# Patient Record
Sex: Male | Born: 1955 | Race: White | Hispanic: No | Marital: Married | Smoking: Never smoker
Health system: Southern US, Community
[De-identification: ages and names within clinical notes are randomized; demographics above are authoritative.]

## PROBLEM LIST (undated history)

## (undated) DIAGNOSIS — N433 Hydrocele, unspecified: Secondary | ICD-10-CM

## (undated) DIAGNOSIS — R7303 Prediabetes: Secondary | ICD-10-CM

## (undated) DIAGNOSIS — R7881 Bacteremia: Secondary | ICD-10-CM

## (undated) DIAGNOSIS — I10 Essential (primary) hypertension: Secondary | ICD-10-CM

## (undated) DIAGNOSIS — K219 Gastro-esophageal reflux disease without esophagitis: Secondary | ICD-10-CM

## (undated) DIAGNOSIS — R9341 Abnormal radiologic findings on diagnostic imaging of renal pelvis, ureter, or bladder: Secondary | ICD-10-CM

## (undated) DIAGNOSIS — R9431 Abnormal electrocardiogram [ECG] [EKG]: Secondary | ICD-10-CM

## (undated) DIAGNOSIS — E119 Type 2 diabetes mellitus without complications: Secondary | ICD-10-CM

## (undated) DIAGNOSIS — S43004A Unspecified dislocation of right shoulder joint, initial encounter: Secondary | ICD-10-CM

## (undated) HISTORY — DX: Abnormal electrocardiogram (ECG) (EKG): R94.31

## (undated) HISTORY — DX: Essential (primary) hypertension: I10

## (undated) HISTORY — DX: Prediabetes: R73.03

## (undated) HISTORY — PX: OTHER SURGICAL HISTORY: SHX169

## (undated) HISTORY — DX: Gastro-esophageal reflux disease without esophagitis: K21.9

---

## 2010-10-04 ENCOUNTER — Encounter (HOSPITAL_COMMUNITY)
Admission: RE | Admit: 2010-10-04 | Discharge: 2010-10-23 | Payer: Self-pay | Source: Home / Self Care | Attending: Endocrinology | Admitting: Endocrinology

## 2010-10-16 ENCOUNTER — Ambulatory Visit (HOSPITAL_COMMUNITY)
Admission: RE | Admit: 2010-10-16 | Discharge: 2010-10-16 | Payer: Self-pay | Source: Home / Self Care | Attending: Endocrinology | Admitting: Endocrinology

## 2010-10-25 ENCOUNTER — Other Ambulatory Visit: Payer: Self-pay | Admitting: Endocrinology

## 2010-10-25 DIAGNOSIS — E041 Nontoxic single thyroid nodule: Secondary | ICD-10-CM

## 2010-12-04 ENCOUNTER — Other Ambulatory Visit (HOSPITAL_COMMUNITY)
Admission: RE | Admit: 2010-12-04 | Discharge: 2010-12-04 | Disposition: A | Discharge status: Home / Self Care | Payer: Self-pay | Source: Ambulatory Visit | Attending: Interventional Radiology | Admitting: Interventional Radiology

## 2010-12-04 ENCOUNTER — Ambulatory Visit
Admission: RE | Admit: 2010-12-04 | Discharge: 2010-12-04 | Disposition: A | Discharge status: Home / Self Care | Payer: Self-pay | Source: Ambulatory Visit | Attending: Endocrinology | Admitting: Endocrinology

## 2010-12-04 ENCOUNTER — Other Ambulatory Visit: Payer: Self-pay | Admitting: Interventional Radiology

## 2010-12-04 DIAGNOSIS — E041 Nontoxic single thyroid nodule: Secondary | ICD-10-CM

## 2010-12-04 DIAGNOSIS — E049 Nontoxic goiter, unspecified: Secondary | ICD-10-CM | POA: Insufficient documentation

## 2010-12-05 ENCOUNTER — Other Ambulatory Visit: Payer: Self-pay

## 2012-09-01 ENCOUNTER — Ambulatory Visit: Payer: Self-pay | Admitting: Cardiovascular Disease

## 2012-09-29 ENCOUNTER — Ambulatory Visit: Payer: Self-pay | Admitting: Cardiovascular Disease

## 2012-10-27 ENCOUNTER — Ambulatory Visit: Payer: Self-pay | Admitting: Cardiovascular Disease

## 2012-10-28 ENCOUNTER — Encounter: Payer: Self-pay | Admitting: Cardiovascular Disease

## 2012-10-28 ENCOUNTER — Ambulatory Visit (INDEPENDENT_AMBULATORY_CARE_PROVIDER_SITE_OTHER): Payer: BC Managed Care – PPO | Admitting: Cardiovascular Disease

## 2012-10-28 ENCOUNTER — Encounter: Payer: Self-pay | Admitting: Cardiology

## 2012-10-28 VITALS — BP 163/93 | HR 68 | Ht 66.0 in | Wt 387.0 lb

## 2012-10-28 DIAGNOSIS — I10 Essential (primary) hypertension: Secondary | ICD-10-CM

## 2012-10-28 DIAGNOSIS — I455 Other specified heart block: Secondary | ICD-10-CM

## 2012-10-28 DIAGNOSIS — I454 Nonspecific intraventricular block: Secondary | ICD-10-CM | POA: Insufficient documentation

## 2012-10-28 DIAGNOSIS — I440 Atrioventricular block, first degree: Secondary | ICD-10-CM | POA: Insufficient documentation

## 2012-10-28 DIAGNOSIS — R9431 Abnormal electrocardiogram [ECG] [EKG]: Secondary | ICD-10-CM

## 2012-10-28 NOTE — Progress Notes (Signed)
   History of Present Illness: 57 yo white male with history of HTN, obesity, GERD here today as a new patient for evaluation of an abnormal EKG. He tells me that he has been doing well. He is a full time farmer and logger. He denies any chest pain, SOB, palpitations, near syncope or syncope. 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 No prior cardiac disease. No family history of CAD. He has never smoked   Primary Care Physician: Rudi Heap  Past Medical History  Diagnosis Date  . Hypertension   . Obesity   . Abnormal EKG   . GERD (gastroesophageal reflux disease)   . Borderline diabetes     Past Surgical History  Procedure Date  . None     Current Outpatient Prescriptions  Medication Sig Dispense Refill  . amLODipine (NORVASC) 10 MG tablet Take 10 mg by mouth daily.      . furosemide (LASIX) 20 MG tablet Take 20 mg by mouth every morning.      Marland Kitchen ibuprofen (ADVIL) 200 MG tablet Take 600 mg by mouth daily as needed.      Marland Kitchen lisinopril (PRINIVIL,ZESTRIL) 40 MG tablet Take 40 mg by mouth daily.      Marland Kitchen omeprazole (PRILOSEC) 20 MG capsule Take 20 mg by mouth daily.        Allergies  Allergen Reactions  . Diovan Hct (Valsartan-Hydrochlorothiazide)     History   Social History  . Marital Status: Married    Spouse Name: N/A    Number of Children: 2  . Years of Education: N/A   Occupational History  . Logger/Farmer    Social History Main Topics  . Smoking status: Never Smoker   . Smokeless tobacco: Not on file  . Alcohol Use: 2.0 oz/week    4 drink(s) per week  . Drug Use: No  . Sexually Active: Not on file   Other Topics Concern  . Not on file   Social History Narrative  . No narrative on file    Family History  Problem Relation Age of Onset  . Cancer Father     prostate cancer  . CAD Neg Hx     Review of Systems:  As stated in the HPI and otherwise negative.   BP 163/93  Pulse 68  Ht 5\' 6"  (1.676 m)  Wt 387 lb (175.542 kg)  BMI 62.46 kg/m2  Physical  Examination: General: Well developed, well nourished, NAD HEENT: OP clear, mucus membranes moist SKIN: warm, dry. No rashes. Neuro: No focal deficits Musculoskeletal: Muscle strength 5/5 all ext Psychiatric: Mood and affect normal Neck: No JVD, no carotid bruits, no thyromegaly, no lymphadenopathy. Lungs:Clear bilaterally, no wheezes, rhonci, crackles Cardiovascular: Regular rate and rhythm. No murmurs, gallops or rubs. Abdomen:Soft. Bowel sounds present. Non-tender.  Extremities: No lower extremity edema. Pulses are 2 + in the bilateral DP/PT.  EKG: Sinus rhythm, 1st degree AV block. IVCD.   Assessment and Plan:   1. Abnormal EKG: He has first degree AV block on EKG with IVCD, non-specific. He denies SOB, palpitations, dizziness, syncope. Will arrange echo to exclude structural heart disease and assess LVEF.   2. HTN: Continue current meds. Management per primary care.

## 2012-10-28 NOTE — Patient Instructions (Addendum)
Your physician recommends that you schedule a follow-up appointment in:   6 weeks.   Your physician has requested that you have an echocardiogram. Echocardiography is a painless test that uses sound waves to create images of your heart. It provides your doctor with information about the size and shape of your heart and how well your heart's chambers and valves are working. This procedure takes approximately one hour. There are no restrictions for this procedure.    

## 2012-11-03 ENCOUNTER — Ambulatory Visit (HOSPITAL_COMMUNITY): Payer: BC Managed Care – PPO | Attending: Cardiology | Admitting: Radiology

## 2012-11-03 DIAGNOSIS — R9431 Abnormal electrocardiogram [ECG] [EKG]: Secondary | ICD-10-CM

## 2012-11-03 DIAGNOSIS — I440 Atrioventricular block, first degree: Secondary | ICD-10-CM | POA: Insufficient documentation

## 2012-11-03 DIAGNOSIS — R7309 Other abnormal glucose: Secondary | ICD-10-CM | POA: Insufficient documentation

## 2012-11-03 DIAGNOSIS — I10 Essential (primary) hypertension: Secondary | ICD-10-CM

## 2012-11-03 DIAGNOSIS — F17200 Nicotine dependence, unspecified, uncomplicated: Secondary | ICD-10-CM | POA: Insufficient documentation

## 2012-11-03 MED ORDER — PERFLUTREN PROTEIN A MICROSPH IV SUSP
3.0000 mL | Freq: Once | INTRAVENOUS | Status: AC
  Administered 2012-11-03: 3 mL via INTRAVENOUS

## 2012-11-03 NOTE — Progress Notes (Signed)
Echocardiogram performed with [employer].  

## 2012-11-11 ENCOUNTER — Telehealth: Payer: Self-pay | Admitting: Cardiovascular Disease

## 2012-11-11 NOTE — Telephone Encounter (Signed)
New problem:  Test results.  

## 2012-11-11 NOTE — Telephone Encounter (Signed)
Spoke with pt's wife and reviewed echo results with her.  

## 2012-12-14 ENCOUNTER — Ambulatory Visit: Payer: BC Managed Care – PPO | Admitting: Cardiovascular Disease

## 2013-01-21 ENCOUNTER — Encounter: Payer: Self-pay | Admitting: Cardiovascular Disease

## 2013-01-21 ENCOUNTER — Ambulatory Visit (INDEPENDENT_AMBULATORY_CARE_PROVIDER_SITE_OTHER): Payer: BC Managed Care – PPO | Admitting: Cardiovascular Disease

## 2013-01-21 VITALS — BP 152/94 | HR 67 | Ht 67.0 in | Wt 386.6 lb

## 2013-01-21 DIAGNOSIS — I10 Essential (primary) hypertension: Secondary | ICD-10-CM

## 2013-01-21 DIAGNOSIS — I517 Cardiomegaly: Secondary | ICD-10-CM

## 2013-01-21 DIAGNOSIS — I119 Hypertensive heart disease without heart failure: Secondary | ICD-10-CM

## 2013-01-21 MED ORDER — TRIAMTERENE 50 MG PO CAPS: 50.0000 mg | ORAL_CAPSULE | Freq: Every day | ORAL | Status: DC

## 2013-01-21 NOTE — Progress Notes (Signed)
History of Present Illness: 57 yo white male with history of HTN, obesity, GERD here today for cardiac follow up. I saw him as a new patient 10/28/12 for evaluation of an abnormal EKG. He tells me that he has been doing well. He is a full time farmer and logger. He denies any chest pain, SOB, palpitations, near syncope or syncope. 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 No prior cardiac disease. No family history of CAD. He has never smoked   Primary Care Physician: Rudi Heap [name])  Past Medical History  Diagnosis Date  . Hypertension   . Obesity   . Abnormal EKG   . GERD (gastroesophageal reflux disease)   . Borderline diabetes     Past Surgical History  Procedure Laterality Date  . None      Current Outpatient Prescriptions  Medication Sig Dispense Refill  . amLODipine (NORVASC) 10 MG tablet Take 10 mg by mouth daily.      . furosemide (LASIX) 20 MG tablet Take 20 mg by mouth every morning.      Marland Kitchen ibuprofen (ADVIL) 200 MG tablet Take 600 mg by mouth daily as needed.      Marland Kitchen lisinopril (PRINIVIL,ZESTRIL) 40 MG tablet Take 40 mg by mouth daily.      Marland Kitchen omeprazole (PRILOSEC) 20 MG capsule Take 20 mg by mouth daily.       No current facility-administered medications for this visit.    Allergies  Allergen Reactions  . Diovan Hct (Valsartan-Hydrochlorothiazide)     History   Social History  . Marital Status: Married    Spouse Name: N/A    Number of Children: 2  . Years of Education: N/A   Occupational History  . Logger/Farmer    Social History Main Topics  . Smoking status: Never Smoker   . Smokeless tobacco: Not on file  . Alcohol Use: 2.0 oz/week    4 drink(s) per week  . Drug Use: No  . Sexually Active: Not on file   Other Topics Concern  . Not on file   Social History Narrative  . No narrative on file    Family History  Problem Relation Age of Onset  . Cancer Father     prostate cancer  . CAD Neg Hx     Review of Systems:  As stated in  the HPI and otherwise negative.   BP 152/94  Pulse 67  Ht 5\' 7"  (1.702 m)  Wt 386 lb 9.6 oz (175.361 kg)  BMI 60.54 kg/m2  SpO2 97%  Physical Examination: General: Well developed, well nourished, NAD HEENT: OP clear, mucus membranes moist SKIN: warm, dry. No rashes. Neuro: No focal deficits Musculoskeletal: Muscle strength 5/5 all ext Psychiatric: Mood and affect normal Neck: No JVD, no carotid bruits, no thyromegaly, no lymphadenopathy. Lungs:Clear bilaterally, no wheezes, rhonci, crackles Cardiovascular: Regular rate and rhythm. No murmurs, gallops or rubs. Abdomen:Soft. Bowel sounds present. Non-tender.  Extremities: No lower extremity edema. Pulses are 2 + in the bilateral DP/PT.  Echo 11/03/12: Left ventricle: The cavity size was normal. Wall thickness was increased in a pattern of moderate LVH. Systolic function was normal. The estimated ejection fraction was in the range of 55% to 60%. Wall motion was normal; there were no regional wall motion abnormalities. Left ventricular diastolic function parameters were normal. - Right ventricle: The cavity size was mildly dilated. - Right atrium: The atrium was mildly dilated.  Assessment and Plan:   1. Abnormal EKG: He has  first degree AV block on EKG with IVCD, non-specific. He denies SOB, palpitations, dizziness, syncope. Echo with LVH.   2. Hypertensive heart disease: BP elevated. Will continue NOrvasc, Ace-inh and add Triamterene 50 mg po Qdaily. Further management of BP in primary care. He will buy a BP cuff and follow at home.   3. Obesity: We have spoken about weight loss. He would like to consider lap band procedure. Will call back in several weeks if he wants to go forward and we will refer to [facility].

## 2013-01-21 NOTE — Patient Instructions (Addendum)
Your physician wants you to follow-up in:  12 months. You will receive a reminder letter in the mail two months in advance. If you don't receive a letter, please call our office to schedule the follow-up appointment.  Your physician has recommended you make the following change in your medication:  Start triamterene 50 mg by mouth daily

## 2013-01-26 ENCOUNTER — Telehealth: Payer: Self-pay | Admitting: Cardiovascular Disease

## 2013-01-26 DIAGNOSIS — I119 Hypertensive heart disease without heart failure: Secondary | ICD-10-CM

## 2013-01-26 MED ORDER — METOPROLOL TARTRATE 25 MG PO TABS: 25.0000 mg | ORAL_TABLET | Freq: Two times a day (BID) | ORAL | Status: DC

## 2013-01-26 NOTE — Telephone Encounter (Signed)
Cancel order for triamterene? Can we start Lopressor 25 mg po BID? Thanks, chris

## 2013-01-26 NOTE — Telephone Encounter (Signed)
I was unable to reach pt to give him this information. Left message for him to call back. I spoke with pharmacist at [facility] and gave her instructions to cancel prescription for triamterene (pt did not pick this up due to cost) and start Lopressor 25 mg by mouth twice daily. Pharmacist will let pt know lopressor is in place of triamterene if we do not speak with him prior to pharmacy filling.

## 2013-01-26 NOTE — Telephone Encounter (Signed)
Spoke with pharmacist at [facility]. Triamterene is not available as generic. Pt does not have prescription coverage and is unable to afford. Will send to Dr. Clifton James for possible alternative.

## 2013-01-26 NOTE — Telephone Encounter (Signed)
New problem    Patient was seen in the office on  5/1    The cost for dyrenium 50 mg . $ 112.00 need an alternative.

## 2013-01-29 ENCOUNTER — Telehealth: Payer: Self-pay | Admitting: *Deleted

## 2013-01-29 NOTE — Telephone Encounter (Signed)
I have heard that Ernest Martinez is a good guy. [facility]. cdm

## 2013-01-29 NOTE — Telephone Encounter (Signed)
Left message to call back  

## 2013-01-29 NOTE — Telephone Encounter (Signed)
Spoke with pt's wife who reports pt is interested in having lap band surgery. She would like to know which surgeon Dr. Clifton James would recommend?

## 2013-01-29 NOTE — Telephone Encounter (Signed)
I have been unable to reach pt to make sure he was aware of change. Have left messages to call back.  I spoke with pharmacist at [facility] and they have spoken with pt's wife and she is aware of change

## 2013-01-29 NOTE — Telephone Encounter (Signed)
Spoke with wife and she is aware of change. Pt is taking metoprolol as ordered.

## 2013-02-03 NOTE — Telephone Encounter (Signed)
Left message to call back  

## 2013-02-23 NOTE — Telephone Encounter (Signed)
Pt's wife notified.

## 2013-03-28 ENCOUNTER — Inpatient Hospital Stay (HOSPITAL_COMMUNITY)
Admission: AD | Admit: 2013-03-28 | Discharge: 2013-04-01 | DRG: 416 | Disposition: A | Discharge status: Home / Self Care | Payer: BC Managed Care – PPO | Source: Other Acute Inpatient Hospital | Attending: Cardiology | Admitting: Cardiology

## 2013-03-28 DIAGNOSIS — I440 Atrioventricular block, first degree: Secondary | ICD-10-CM | POA: Diagnosis present

## 2013-03-28 DIAGNOSIS — R739 Hyperglycemia, unspecified: Secondary | ICD-10-CM | POA: Diagnosis present

## 2013-03-28 DIAGNOSIS — N12 Tubulo-interstitial nephritis, not specified as acute or chronic: Secondary | ICD-10-CM | POA: Diagnosis present

## 2013-03-28 DIAGNOSIS — Z6841 Body Mass Index (BMI) 40.0 and over, adult: Secondary | ICD-10-CM

## 2013-03-28 DIAGNOSIS — R079 Chest pain, unspecified: Secondary | ICD-10-CM | POA: Diagnosis present

## 2013-03-28 DIAGNOSIS — I10 Essential (primary) hypertension: Secondary | ICD-10-CM | POA: Diagnosis present

## 2013-03-28 DIAGNOSIS — K219 Gastro-esophageal reflux disease without esophagitis: Secondary | ICD-10-CM | POA: Diagnosis present

## 2013-03-28 DIAGNOSIS — N390 Urinary tract infection, site not specified: Secondary | ICD-10-CM

## 2013-03-28 DIAGNOSIS — Z7982 Long term (current) use of aspirin: Secondary | ICD-10-CM

## 2013-03-28 DIAGNOSIS — R7309 Other abnormal glucose: Secondary | ICD-10-CM | POA: Diagnosis present

## 2013-03-28 DIAGNOSIS — R350 Frequency of micturition: Secondary | ICD-10-CM | POA: Diagnosis present

## 2013-03-28 DIAGNOSIS — R509 Fever, unspecified: Secondary | ICD-10-CM

## 2013-03-28 DIAGNOSIS — N433 Hydrocele, unspecified: Secondary | ICD-10-CM | POA: Diagnosis present

## 2013-03-28 DIAGNOSIS — A419 Sepsis, unspecified organism: Principal | ICD-10-CM | POA: Diagnosis present

## 2013-03-28 DIAGNOSIS — I454 Nonspecific intraventricular block: Secondary | ICD-10-CM | POA: Diagnosis present

## 2013-03-28 DIAGNOSIS — R7881 Bacteremia: Secondary | ICD-10-CM | POA: Diagnosis present

## 2013-03-28 DIAGNOSIS — E1159 Type 2 diabetes mellitus with other circulatory complications: Secondary | ICD-10-CM | POA: Diagnosis present

## 2013-03-28 HISTORY — DX: Bacteremia: R78.81

## 2013-03-28 HISTORY — DX: Abnormal radiologic findings on diagnostic imaging of renal pelvis, ureter, or bladder: R93.41

## 2013-03-28 HISTORY — DX: Morbid (severe) obesity due to excess calories: E66.01

## 2013-03-28 HISTORY — DX: Hydrocele, unspecified: N43.3

## 2013-03-28 LAB — CBC
Hemoglobin: 13.6 g/dL (ref 13.0–17.0)
MCV: 89.7 fL (ref 78.0–100.0)
Platelets: 145 10*3/uL — ABNORMAL LOW (ref 150–400)
RBC: 4.38 MIL/uL (ref 4.22–5.81)
WBC: 17 10*3/uL — ABNORMAL HIGH (ref 4.0–10.5)

## 2013-03-28 LAB — CREATININE, SERUM: GFR calc Af Amer: 88 mL/min — ABNORMAL LOW (ref >90)

## 2013-03-28 MED ORDER — ACETAMINOPHEN 650 MG RE SUPP: 650.0000 mg | Freq: Four times a day (QID) | RECTAL | Status: DC | PRN

## 2013-03-28 MED ORDER — SODIUM CHLORIDE 0.9 % IJ SOLN
3.0000 mL | Freq: Two times a day (BID) | INTRAMUSCULAR | Status: DC
  Administered 2013-03-28 – 2013-03-31 (×7): 3 mL via INTRAVENOUS

## 2013-03-28 MED ORDER — ACETAMINOPHEN 325 MG PO TABS
650.0000 mg | ORAL_TABLET | Freq: Four times a day (QID) | ORAL | Status: DC | PRN
  Administered 2013-03-28 – 2013-03-31 (×6): 650 mg via ORAL
  Filled 2013-03-28 (×6): qty 2

## 2013-03-28 MED ORDER — POTASSIUM CHLORIDE CRYS ER 20 MEQ PO TBCR
40.0000 meq | EXTENDED_RELEASE_TABLET | Freq: Two times a day (BID) | ORAL | Status: AC
  Administered 2013-03-28 – 2013-03-29 (×4): 40 meq via ORAL
  Filled 2013-03-28 (×5): qty 2

## 2013-03-28 MED ORDER — LISINOPRIL 40 MG PO TABS
40.0000 mg | ORAL_TABLET | Freq: Every day | ORAL | Status: DC
  Administered 2013-03-28 – 2013-03-31 (×4): 40 mg via ORAL
  Filled 2013-03-28 (×5): qty 1

## 2013-03-28 MED ORDER — ASPIRIN EC 81 MG PO TBEC
81.0000 mg | DELAYED_RELEASE_TABLET | Freq: Every day | ORAL | Status: DC
  Administered 2013-03-28 – 2013-03-31 (×4): 81 mg via ORAL
  Filled 2013-03-28 (×5): qty 1

## 2013-03-28 MED ORDER — SODIUM CHLORIDE 0.9 % IV SOLN
250.0000 mL | INTRAVENOUS | Status: DC | PRN
  Administered 2013-03-28: 250 mL via INTRAVENOUS

## 2013-03-28 MED ORDER — FUROSEMIDE 20 MG PO TABS
20.0000 mg | ORAL_TABLET | Freq: Every morning | ORAL | Status: DC
  Administered 2013-03-28 – 2013-03-31 (×4): 20 mg via ORAL
  Filled 2013-03-28 (×5): qty 1

## 2013-03-28 MED ORDER — AMLODIPINE BESYLATE 10 MG PO TABS
10.0000 mg | ORAL_TABLET | Freq: Every day | ORAL | Status: DC
  Administered 2013-03-28 – 2013-03-31 (×4): 10 mg via ORAL
  Filled 2013-03-28 (×5): qty 1

## 2013-03-28 MED ORDER — ONDANSETRON HCL 4 MG/2ML IJ SOLN
INTRAMUSCULAR | Status: AC
  Filled 2013-03-28: qty 2

## 2013-03-28 MED ORDER — PANTOPRAZOLE SODIUM 40 MG PO TBEC
40.0000 mg | DELAYED_RELEASE_TABLET | Freq: Every day | ORAL | Status: DC
  Administered 2013-03-28 – 2013-03-31 (×4): 40 mg via ORAL
  Filled 2013-03-28 (×3): qty 1

## 2013-03-28 MED ORDER — NITROGLYCERIN IN D5W 200-5 MCG/ML-% IV SOLN
INTRAVENOUS | Status: AC
  Administered 2013-03-28: 5 ug/min via INTRAVENOUS
  Filled 2013-03-28: qty 250

## 2013-03-28 MED ORDER — SODIUM CHLORIDE 0.9 % IJ SOLN
3.0000 mL | Freq: Two times a day (BID) | INTRAMUSCULAR | Status: DC
  Administered 2013-03-28 – 2013-03-30 (×4): 3 mL via INTRAVENOUS

## 2013-03-28 MED ORDER — SODIUM CHLORIDE 0.9 % IJ SOLN: 3.0000 mL | INTRAMUSCULAR | Status: DC | PRN

## 2013-03-28 MED ORDER — ENOXAPARIN SODIUM 40 MG/0.4ML ~~LOC~~ SOLN
40.0000 mg | SUBCUTANEOUS | Status: DC
  Administered 2013-03-28: 40 mg via SUBCUTANEOUS
  Filled 2013-03-28 (×2): qty 0.4

## 2013-03-28 MED ORDER — METOPROLOL TARTRATE 25 MG PO TABS
25.0000 mg | ORAL_TABLET | Freq: Two times a day (BID) | ORAL | Status: DC
  Administered 2013-03-28 – 2013-03-30 (×5): 25 mg via ORAL
  Filled 2013-03-28 (×8): qty 1

## 2013-03-28 MED ORDER — DOXYCYCLINE HYCLATE 100 MG PO TABS
100.0000 mg | ORAL_TABLET | Freq: Two times a day (BID) | ORAL | Status: DC
  Administered 2013-03-28 – 2013-03-29 (×3): 100 mg via ORAL
  Filled 2013-03-28 (×4): qty 1

## 2013-03-28 MED ORDER — IBUPROFEN 600 MG PO TABS
600.0000 mg | ORAL_TABLET | Freq: Every day | ORAL | Status: DC | PRN
  Administered 2013-03-29: 600 mg via ORAL
  Filled 2013-03-28: qty 1

## 2013-03-28 MED ORDER — NITROGLYCERIN IN D5W 200-5 MCG/ML-% IV SOLN: 2.0000 ug/min | INTRAVENOUS | Status: DC

## 2013-03-28 NOTE — Progress Notes (Signed)
Pt c/o 4/10 chest pain that occurred while attempting to eat his meal. He described the pain as the "same as this morning, but less so" - this morning's chest pain was 10/10. The pain stayed midline at his chest and felt like it was "pushing down." It did not radiate to either arm, pt was not diaphoretic or nauseous at time of reporting.   Notified MD (Turner), STAT troponin drawn with q6h follow up, pt started at nitro gtt at 74mcg/min r/t lower SBPs (100s SBP). Will monitor.   Check at 1 hour after starting gtt, pt reported pain was "much better, at a 1/10."   Will monitor.   Delynn Flavin, RN, BSN

## 2013-03-28 NOTE — Progress Notes (Addendum)
Pt afebrile: extremely diaphoretic and easily excited. C/o alternating hot/cold episodes.Chronic back pain relieved by being OOB in chair. Wife stated that pt had had w/u for enlarged testicles and had difficulty with his hypertensive medications as they were making him feel terrible. See flow sheet for orthostatics on admission.

## 2013-03-28 NOTE — H&P (Signed)
Physician History and Physical  Patient ID: Ernest Martinez MRN: [id number] DOB/AGE: [birth year] 57 y.o. Admit date: 03/28/2013  Primary Care Physician: Rudi Heap, MD Primary Cardiologist: Reynold Bowen  Active Problems:   * No active hospital problems. 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  Last seen by Dr Duaine Dredge 5/14 Initially seen for abnormal ECG  10/27/12 SR rate 71 PR 218 LAD IVCD.  Wife has been sick last couple weeks ? Sinuses Rx with antibiotics.  The last 24 hours he had mild headache.  Fever  He notes to 104 but I dont see documentation.  Then developed some pressure In his chest. No history of CAD, stress test or cath.  Pressure gone now.  History of GERD on Rx.  Reviewed ECG from [facility] and no change from 2/14 SR rate 101 LAD LVH and IVCD PR 204.   Has lots of tick bites.  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  No cough sputum, dysuria.  Denies rash.  No neck stiffness.  Mobility limited by morbid obesity.  BC;s drawn at [facility]  Also noted to be hypokalemic and K replaced at [facility].  WBC 12.8 mildly elevated K 2.9  Troponin .03 and BNP normal CXR also normal.    Review of systems complete and found to be negative unless listed above   Past Medical History  Diagnosis Date  . Hypertension   . Obesity   . Abnormal EKG   . GERD (gastroesophageal reflux disease)   . Borderline diabetes     Family History  Problem Relation Age of Onset  . Cancer Father     prostate cancer  . CAD Neg Hx     History   Social History  . Marital Status: Married    Spouse Name: N/A    Number of Children: 2  . Years of Education: N/A   Occupational History  . Logger/Farmer    Social History Main Topics  . Smoking status: Never Smoker   . Smokeless tobacco: Not on file  . Alcohol Use: 2.0 oz/week    4 drink(s) per week  . Drug Use: No  . Sexually Active: Not on file   Other Topics Concern  . Not on file    Social History Narrative  . No narrative on file    Past Surgical History  Procedure Laterality Date  . None       Prescriptions prior to admission  Medication Sig Dispense Refill  . amLODipine (NORVASC) 10 MG tablet Take 10 mg by mouth daily.      . furosemide (LASIX) 20 MG tablet Take 20 mg by mouth every morning.      Marland Kitchen ibuprofen (ADVIL) 200 MG tablet Take 600 mg by mouth daily as needed.      Marland Kitchen lisinopril (PRINIVIL,ZESTRIL) 40 MG tablet Take 40 mg by mouth daily.      . metoprolol tartrate (LOPRESSOR) 25 MG tablet Take 1 tablet (25 mg total) by mouth 2 (two) times daily.  60 tablet  11  . omeprazole (PRILOSEC) 20 MG capsule Take 20 mg by mouth daily.        Physical Exam: Temperature 99.2 F (37.3 C), temperature source Oral.   Affect appropriate Morbidly obese HEENT: normal no neck stiffness Neck supple with no adenopathy JVP normal no bruits no thyromegaly Lungs clear with no wheezing and good diaphragmatic motion Heart:  S1/S2 no murmur, no rub, gallop or  click PMI normal Abdomen: benighn, BS positve, no tenderness, no AAA no bruit.  No HSM or HJR Distal pulses intact with no bruits Plus one bilateral edema with stasis Neuro non-focal Skin warm and dry tick bites on back and buttocks  No muscular weakness   Labs:    Radiology: No results found. CXR at [facility] NAD  EKG: SEE HPI  No acute changes Long PR IVCD LVH and LAD  ASSESSMENT AND PLAN: Chest Pain:  Does not appear signficant with negative troponin.  His weight precludes accurate non invasive testing and dont think cath indicated. F/U troponin and ECG in am Echo with contrast for EF Continue ASA and beta blocker. Lovenox DVT prophylaxis  Fever:  Check lyme titers and urine.  BC;s for temp over 101  Empirically Rx 10 days with doxycycline 100 bid for 10 days.  If fever recurs consider ID consult GERD:  May be related to chest pressure in setting of fever.  Continue proton pump inhibitor Low K:  On  diuretic  Supplement K and follow Should be discharged on potassium Edema: Dependant from edema continue diuretic HTN:  Continue home meds.    SignedTheron Arista Nishan7/02/2013, 12:39 PM

## 2013-03-29 DIAGNOSIS — N433 Hydrocele, unspecified: Secondary | ICD-10-CM | POA: Diagnosis present

## 2013-03-29 DIAGNOSIS — R509 Fever, unspecified: Secondary | ICD-10-CM

## 2013-03-29 DIAGNOSIS — R739 Hyperglycemia, unspecified: Secondary | ICD-10-CM | POA: Diagnosis present

## 2013-03-29 DIAGNOSIS — B9689 Other specified bacterial agents as the cause of diseases classified elsewhere: Secondary | ICD-10-CM

## 2013-03-29 DIAGNOSIS — I10 Essential (primary) hypertension: Secondary | ICD-10-CM

## 2013-03-29 DIAGNOSIS — I517 Cardiomegaly: Secondary | ICD-10-CM

## 2013-03-29 DIAGNOSIS — N390 Urinary tract infection, site not specified: Secondary | ICD-10-CM

## 2013-03-29 LAB — URINALYSIS, ROUTINE W REFLEX MICROSCOPIC
Glucose, UA: NEGATIVE mg/dL
Ketones, ur: NEGATIVE mg/dL
Nitrite: NEGATIVE
Specific Gravity, Urine: 1.024 (ref 1.005–1.030)
pH: 5.5 (ref 5.0–8.0)

## 2013-03-29 LAB — URINE CULTURE: Colony Count: >=100000

## 2013-03-29 LAB — HEPATIC FUNCTION PANEL
ALT: 22 U/L (ref 0–53)
AST: 20 U/L (ref 0–37)
Bilirubin, Direct: 0.3 mg/dL (ref 0.0–0.3)
Total Protein: 6.3 g/dL (ref 6.0–8.3)

## 2013-03-29 LAB — URINE MICROSCOPIC-ADD ON

## 2013-03-29 LAB — BASIC METABOLIC PANEL
BUN: 18 mg/dL (ref 6–23)
CO2: 28 mEq/L (ref 19–32)
Chloride: 101 mEq/L (ref 96–112)
Creatinine, Ser: 0.96 mg/dL (ref 0.50–1.35)

## 2013-03-29 LAB — TROPONIN I: Troponin I: <0.3 ng/mL (ref <0.30)

## 2013-03-29 LAB — B. BURGDORFI ANTIBODIES: B burgdorferi Ab IgG+IgM: 0.19 {ISR}

## 2013-03-29 MED ORDER — DEXTROSE 5 % IV SOLN
1.0000 g | Freq: Two times a day (BID) | INTRAVENOUS | Status: DC
  Administered 2013-03-29 (×2): 1 g via INTRAVENOUS
  Filled 2013-03-29 (×4): qty 1

## 2013-03-29 MED ORDER — PERFLUTREN LIPID MICROSPHERE
INTRAVENOUS | Status: AC
  Filled 2013-03-29: qty 10

## 2013-03-29 MED ORDER — PERFLUTREN LIPID MICROSPHERE
1.0000 mL | INTRAVENOUS | Status: AC | PRN
  Administered 2013-03-29: 4 mL via INTRAVENOUS
  Filled 2013-03-29: qty 10

## 2013-03-29 MED ORDER — ENOXAPARIN SODIUM 100 MG/ML ~~LOC~~ SOLN
0.5000 mg/kg | SUBCUTANEOUS | Status: DC
  Administered 2013-03-29 – 2013-03-31 (×3): 90 mg via SUBCUTANEOUS
  Filled 2013-03-29 (×4): qty 1

## 2013-03-29 NOTE — Progress Notes (Signed)
*  PRELIMINARY RESULTS* Echocardiogram 2D Echocardiogram has been performed.  Ernest Martinez 03/29/2013, 12:15 PM

## 2013-03-29 NOTE — Progress Notes (Signed)
Lovenox for DVT Prophylaxis  Height 66" Weight 177kg  BMI 63.  CBC    Component Value Date/Time   WBC 17.0* 03/28/2013 1349   RBC 4.38 03/28/2013 1349   HGB 13.6 03/28/2013 1349   HCT 39.3 03/28/2013 1349   PLT 145* 03/28/2013 1349   MCV 89.7 03/28/2013 1349   MCH 31.1 03/28/2013 1349   MCHC 34.6 03/28/2013 1349   RDW 12.6 03/28/2013 1349    Assessment:  Lovenox requires adjustment for BMI >30.  Plan:  Will adjust Lovenox to 0.5mg /kg SQ qday for DVT prophylaxis.   Estella Husk, Pharm.D., BCPS Clinical Pharmacist Phone: [phone] or [phone] Pager: [phone] 03/29/2013, 9:24 AM

## 2013-03-29 NOTE — Consult Note (Signed)
[facility]    Date of Admission:  03/28/2013   Total days of antibiotics 2               Reason for Consult: Fever    Referring Physician: Dr. Earney Hamburg  Principal Problem:   Bacteremia due to Gram-negative bacteria Active Problems:   1St degree AV block   IVCD (intraventricular conduction defect)   HTN (hypertension)   Chest pain   Hyperglycemia   Morbid obesity   UTI (urinary tract infection)   Hydrocele, right   . amLODipine  10 mg Oral Daily  . aspirin EC  81 mg Oral Daily  . ceFEPime (MAXIPIME) IV  1 g Intravenous Q12H  . enoxaparin (LOVENOX) injection  0.5 mg/kg Subcutaneous Q24H  . furosemide  20 mg Oral q morning - 10a  . lisinopril  40 mg Oral Daily  . metoprolol tartrate  25 mg Oral BID  . pantoprazole  40 mg Oral Daily  . potassium chloride  40 mEq Oral BID  . sodium chloride  3 mL Intravenous Q12H  . sodium chloride  3 mL Intravenous Q12H    Recommendations: 1. Continue cefepime pending final blood and urine culture results  2. Recommend urologic evaluation  Assessment: His fevers are due to a bacteremic gram-negative rod urinary tract infection. I switched his empiric doxycycline to cefepime pending final culture results. I would also suggest urological evaluation while he is here. I will followup tomorrow after calling [facility] for further microbiology results.    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 He was seen in the [facility] emergency department yesterday morning where he reports he had a temperature of 104. He complained of chest pain so he was transferred here for further evaluation. Blood cultures done at [facility] are already growing gram-negative rods and he also has gram-negative rods in his urine culture here. He reports about a six-month history of right testicular swelling. He was seen by a urologist, Dr. Van Clines (now  retired), in [place] about 6 months ago and told he had fluid building up on his right testicle and would need surgery. Initially the swelling would come and go but recently the swelling has stayed. It is nontender. When he started to get sick 2 days ago he also noted urinary frequency and mild dysuria. He describes diffuse aching pain and mild lower back pain that did not lateralize. He is feeling a little bit better today.   Review of Systems: Constitutional: positive for chills, fevers and malaise, negative for sweats and weight loss Eyes: negative Ears, nose, mouth, throat, and face: negative Respiratory: negative Cardiovascular: positive for chest pain, negative for dyspnea Gastrointestinal: negative Genitourinary:positive for dysuria, frequency and hematuria, negative for decreased stream and urinary incontinence Integument/breast: negative, he frequently has attached ticks when he is working  Past Medical History  Diagnosis Date  . Hypertension   . Obesity   . Abnormal EKG   . GERD (gastroesophageal reflux disease)   . Borderline diabetes     History  Substance Use Topics  . Smoking status: Never Smoker   . Smokeless tobacco: Not on file  . Alcohol Use: 2.0 oz/week    4 drink(s) per week    Family History  Problem Relation Age of Onset  . Cancer Father     prostate cancer  . CAD Neg Hx  No Active Allergies  OBJECTIVE: Blood pressure 106/68, pulse 60, temperature 98.4 F (36.9 C), temperature source Oral, resp. rate 25, height 5\' 6"  (1.676 m), weight 177 kg (390 lb 3.4 oz), SpO2 95.00%. General: He is alert and in no distress. He is morbidly obese. Skin: No rash Oral: No oropharyngeal lesions Neck: Supple Lungs: Clear Cor: Very distant heart sounds. No murmur heard Abdomen: Obese, soft and nontender GU: Soft nontender enlargement around the right testicle Neuro: Alert and fully oriented with normal speech Joints and extremities: No acute  abnormalities Mood and affect: Normal Lab Results  Component Value Date   WBC 17.0* 03/28/2013   HGB 13.6 03/28/2013   HCT 39.3 03/28/2013   MCV 89.7 03/28/2013   PLT 145* 03/28/2013   BMET    Component Value Date/Time   NA 134* 03/29/2013 0400   K 4.3 03/29/2013 0400   CL 101 03/29/2013 0400   CO2 28 03/29/2013 0400   GLUCOSE 161* 03/29/2013 0400   BUN 18 03/29/2013 0400   CREATININE 0.96 03/29/2013 0400   CALCIUM 8.1* 03/29/2013 0400   GFRNONAA >90 03/29/2013 0400   GFRAA >90 03/29/2013 0400   Lab Results  Component Value Date   ALT 22 03/29/2013   AST 20 03/29/2013   ALKPHOS 57 03/29/2013   BILITOT 0.7 03/29/2013   Microbiology: Recent Results (from the past 240 hour(s))  MRSA PCR SCREENING     Status: None   Collection Time    03/28/13 12:23 PM      Result Value Range Status   MRSA by PCR NEGATIVE  NEGATIVE Final   Comment:            The GeneXpert MRSA Assay (FDA     approved for NASAL specimens     only), is one component of a     comprehensive MRSA colonization     surveillance program. It is not     intended to diagnose MRSA     infection nor to guide or     monitor treatment for     MRSA infections.  URINE CULTURE     Status: None   Collection Time    03/28/13 12:52 PM      Result Value Range Status   Specimen Description URINE, CLEAN CATCH   Final   Special Requests Normal   Final   Culture  Setup Time 03/28/2013 23:07   Final   Colony Count >=100,000 COLONIES/ML   Final   Culture GRAM NEGATIVE RODS   Final   Report Status PENDING   Incomplete    Cliffton Asters, MD [facility] [phone] pager   [phone] cell 03/29/2013, 12:34 PM

## 2013-03-29 NOTE — Progress Notes (Signed)
    SUBJECTIVE: c/o headaches, diaphoresis, diffuse body aches. No chest pain or SOB this am.   BP 132/84  Pulse 92  Temp(Src) 98.9 F (37.2 C) (Oral)  Resp 26  Ht 5\' 6"  (1.676 m)  Wt 390 lb 3.4 oz (177 kg)  BMI 63.01 kg/m2  SpO2 94%  Intake/Output Summary (Last 24 hours) at 03/29/13 0651 Last data filed at 03/29/13 0400  Gross per 24 hour  Intake 1214.8 ml  Output    350 ml  Net  864.8 ml    PHYSICAL EXAM General: Well developed, well nourished, in no acute distress. Alert and oriented x 3.  Psych:  Good affect, responds appropriately Neck: No JVD. No masses noted.  Lungs: Clear bilaterally with no wheezes or rhonci noted.  Heart: RRR with no murmurs noted. Abdomen: Bowel sounds are present. Soft, non-tender.  Extremities: No lower extremity edema.   LABS: Basic Metabolic Panel:  Recent Labs  16/10/96 1349 03/29/13 0400  NA  --  134*  K  --  4.3  CL  --  101  CO2  --  28  GLUCOSE  --  161*  BUN  --  18  CREATININE 1.07 0.96  CALCIUM  --  8.1*   CBC:  Recent Labs  03/28/13 1349  WBC 17.0*  HGB 13.6  HCT 39.3  MCV 89.7  PLT 145*   Cardiac Enzymes:  Recent Labs  03/28/13 2034 03/29/13 0400  TROPONINI <0.30 <0.30   Current Meds: . amLODipine  10 mg Oral Daily  . aspirin EC  81 mg Oral Daily  . doxycycline  100 mg Oral Q12H  . enoxaparin (LOVENOX) injection  40 mg Subcutaneous Q24H  . furosemide  20 mg Oral q morning - 10a  . lisinopril  40 mg Oral Daily  . metoprolol tartrate  25 mg Oral BID  . pantoprazole  40 mg Oral Daily  . potassium chloride  40 mEq Oral BID  . sodium chloride  3 mL Intravenous Q12H  . sodium chloride  3 mL Intravenous Q12H    ASSESSMENT AND PLAN: 57 yo male with h/o HTN, GERD, morbid obesity transferred from [facility] with fever, chills, headache, diffuse body aches. He also has complaints of chest pressure after eating.   1. Chest Pain: Recurrent chest pain while eating last night. Troponin negative last  night and again this am. Plans in place for Echo today to assess LVEF. Continue PPI for possible GERD related chest pain. Continue ASA and beta blocker. Lovenox DVT prophylaxis. This does not appear to be cardiac related chest pain. No objective evidence of ischemia. Will stop IV NTG.   2. Fever/chills/diffuse body aches: Elevated WBC count. Afebrile since admission here. He is being treated empirically with doxycycline 100 bid for 10 days since he has had multiple tick bites. B. burgdorfi antibodies pending. Urine culture pending. Will send U/A this am with c/o foul smelling urine as there is no U/A in our record. Will get ID consult as this sounds like an infectious etiology.   3. GERD: May be related to chest pressure. Continue proton pump inhibitor   4. HTN: Continue home meds.     Ernest Martinez  7/7/20146:51 AM

## 2013-03-29 NOTE — Progress Notes (Signed)
Utilization Review Completed.03/29/2013

## 2013-03-30 ENCOUNTER — Encounter (HOSPITAL_COMMUNITY): Payer: Self-pay | Admitting: *Deleted

## 2013-03-30 ENCOUNTER — Inpatient Hospital Stay (HOSPITAL_COMMUNITY): Payer: BC Managed Care – PPO

## 2013-03-30 DIAGNOSIS — R7881 Bacteremia: Secondary | ICD-10-CM

## 2013-03-30 LAB — CBC
HCT: 39 % (ref 39.0–52.0)
Hemoglobin: 13 g/dL (ref 13.0–17.0)
MCH: 30.9 pg (ref 26.0–34.0)
MCHC: 33.3 g/dL (ref 30.0–36.0)
MCV: 92.6 fL (ref 78.0–100.0)

## 2013-03-30 LAB — TROPONIN I: Troponin I: <0.3 ng/mL (ref <0.30)

## 2013-03-30 LAB — BASIC METABOLIC PANEL
BUN: 16 mg/dL (ref 6–23)
Chloride: 105 mEq/L (ref 96–112)
Creatinine, Ser: 0.82 mg/dL (ref 0.50–1.35)
Glucose, Bld: 136 mg/dL — ABNORMAL HIGH (ref 70–99)
Potassium: 4.6 mEq/L (ref 3.5–5.1)

## 2013-03-30 MED ORDER — IOHEXOL 300 MG/ML  SOLN: 25.0000 mL | INTRAMUSCULAR | Status: AC

## 2013-03-30 MED ORDER — IOHEXOL 300 MG/ML  SOLN
100.0000 mL | Freq: Once | INTRAMUSCULAR | Status: AC | PRN
  Administered 2013-03-30: 125 mL via INTRAVENOUS

## 2013-03-30 MED ORDER — IBUPROFEN 600 MG PO TABS
600.0000 mg | ORAL_TABLET | Freq: Four times a day (QID) | ORAL | Status: DC | PRN
  Administered 2013-03-31 – 2013-04-01 (×2): 600 mg via ORAL
  Filled 2013-03-30 (×2): qty 1

## 2013-03-30 MED ORDER — DEXTROSE 5 % IV SOLN
1.0000 g | INTRAVENOUS | Status: DC
  Administered 2013-03-30 – 2013-03-31 (×2): 1 g via INTRAVENOUS
  Filled 2013-03-30 (×2): qty 10

## 2013-03-30 NOTE — Consult Note (Signed)
Urology Consult   Physician requesting consult: Mcalhany  Reason for consult: UTI and scrotal mass  History of Present Illness: Ernest Martinez is a 57 y.o. with PMH significant for HTN, obesity, and GERD who was admitted 03/28/13 via transfer from [facility] for eval of F/C, urinary frequency, and chest pain.  Pt states that his frequency began late last Sat night and continued into Sunday.  On Sunday he developed the mild dysuria, fevers/chills, and chest pain.  He denied any other urinary issues at that time including feelings of incomplete emptying, difficulty voiding, abdominal pain, back pain, and hematuria. His chest pain has been completely evaled by cardiology and is thought to be due to GERD.  Blood cultures performed at [facility] and [facility] have grown gram negative rods.  Urine culture performed here grew Klebsiella sensitive to several ABx.  He has been evaled by ID and his ABx are being directed by them.    Pt states that he was evaled by a urologist in [facility] (Dr. Van Clines who is now retired) 6 months ago for right sided scrotal swelling.  The swelling had been present for several months prior to eval.  It would vary in size but "was staying more swollen" which prompted his eval.  He was told it was "water on the testicle" and that he did not need to do anything about it.  He denies testicular/scrotal pain.  He also had two episodes of gross hematuria 6-8 months ago accompanied by mild dysuria.  He denies a hx of kidney stones but is uncertain if he passed anything at the time of the hematuria.  He mentioned this at the time of his urologic eval and was told to "not worry about it".  He has had no further episodes since that time.  He had a rectal exam performed "several years ago" by his PCP but does not know if he has ever had a PSA checked.    He is currently resting comfortably and denies F/C, headaches, CP, SOB, N/V, and diarreha/constipation.  He is voiding without difficulty  and denies dysuria and hematuria.    Past Medical History  Diagnosis Date  . Hypertension   . Obesity   . Abnormal EKG   . GERD (gastroesophageal reflux disease)   . Borderline diabetes     Past Surgical History  Procedure Laterality Date  . None      Current Hospital Medications:  Home Meds:    Medication List    ASK your doctor about these medications       ADVIL 200 MG tablet  Generic drug:  ibuprofen  Take 600 mg by mouth daily as needed.     amLODipine 10 MG tablet  Commonly known as:  NORVASC  Take 10 mg by mouth daily.     furosemide 20 MG tablet  Commonly known as:  LASIX  Take 20 mg by mouth every morning.     lisinopril 40 MG tablet  Commonly known as:  PRINIVIL,ZESTRIL  Take 40 mg by mouth daily.     metoprolol tartrate 25 MG tablet  Commonly known as:  LOPRESSOR  Take 1 tablet (25 mg total) by mouth 2 (two) times daily.     omeprazole 20 MG capsule  Commonly known as:  PRILOSEC  Take 20 mg by mouth daily.        Scheduled Meds: . amLODipine  10 mg Oral Daily  . aspirin EC  81 mg Oral Daily  . cefTRIAXone (ROCEPHIN)  IV  1 g Intravenous Q24H  . enoxaparin (LOVENOX) injection  0.5 mg/kg Subcutaneous Q24H  . furosemide  20 mg Oral q morning - 10a  . lisinopril  40 mg Oral Daily  . metoprolol tartrate  25 mg Oral BID  . pantoprazole  40 mg Oral Daily  . sodium chloride  3 mL Intravenous Q12H  . sodium chloride  3 mL Intravenous Q12H   Continuous Infusions:  PRN Meds:.sodium chloride, acetaminophen, acetaminophen, ibuprofen, sodium chloride  Allergies: No Active Allergies  Family History  Problem Relation Age of Onset  . Cancer Father     prostate cancer  . CAD Neg Hx     Social History:  reports that he has never smoked. He does not have any smokeless tobacco history on file. He reports that he drinks about 2.0 ounces of alcohol per week. He reports that he does not use illicit drugs.  ROS: A complete review of systems was  performed.  All systems are negative except for pertinent findings as noted.  Physical Exam:  Vital signs in last 24 hours: Temp:  [98.2 F (36.8 C)-99.7 F (37.6 C)] 98.9 F (37.2 C) (07/08 0805) Pulse Rate:  [60-80] 77 (07/08 0911) Resp:  [23-28] 26 (07/08 0805) BP: (92-142)/(53-74) 130/69 mmHg (07/08 0912) SpO2:  [39 %-96 %] 96 % (07/08 0805) Weight:  [177 kg (390 lb 3.4 oz)] 177 kg (390 lb 3.4 oz) (07/08 0406) Constitutional:  Alert and oriented, No acute distress Cardiovascular: Regular rate and rhythm, No JVD Respiratory: Normal respiratory effort, Lungs clear bilaterally GI: Abdomen is soft, nontender, nondistended, no abdominal masses GU: Penis without lesions or discharge; diffuse soft scrotal swelling mostly on the right side; testicles cannot be felt due to swelling Lymphatic: No lymphadenopathy Neurologic: Grossly intact, no focal deficits Psychiatric: Normal mood and affect  Laboratory Data:   Recent Labs  03/28/13 1349 03/30/13 0543  WBC 17.0* 6.5  HGB 13.6 13.0  HCT 39.3 39.0  PLT 145* 122*     Recent Labs  03/28/13 1349 03/29/13 0400 03/30/13 0543  NA  --  134* 139  K  --  4.3 4.6  CL  --  101 105  GLUCOSE  --  161* 136*  BUN  --  18 16  CALCIUM  --  8.1* 8.2*  CREATININE 1.07 0.96 0.82     Results for orders placed during the hospital encounter of 03/28/13 (from the past 24 hour(s))  URINALYSIS, ROUTINE W REFLEX MICROSCOPIC     Status: Abnormal   Collection Time    03/29/13  1:47 PM      Result Value Range   Color, Urine AMBER (*) YELLOW   APPearance HAZY (*) CLEAR   Specific Gravity, Urine 1.024  1.005 - 1.030   pH 5.5  5.0 - 8.0   Glucose, UA NEGATIVE  NEGATIVE mg/dL   Hgb urine dipstick NEGATIVE  NEGATIVE   Bilirubin Urine SMALL (*) NEGATIVE   Ketones, ur NEGATIVE  NEGATIVE mg/dL   Protein, ur 30 (*) NEGATIVE mg/dL   Urobilinogen, UA 2.0 (*) 0.0 - 1.0 mg/dL   Nitrite NEGATIVE  NEGATIVE   Leukocytes, UA SMALL (*) NEGATIVE  URINE  MICROSCOPIC-ADD ON     Status: Abnormal   Collection Time    03/29/13  1:47 PM      Result Value Range   Squamous Epithelial / LPF RARE  RARE   WBC, UA 3-6  <3 WBC/hpf   Bacteria, UA MANY (*) RARE  Casts HYALINE CASTS (*) NEGATIVE   Urine-Other MUCOUS PRESENT    URINE CULTURE     Status: None   Collection Time    03/29/13  1:47 PM      Result Value Range   Specimen Description URINE, CLEAN CATCH     Special Requests NONE     Culture  Setup Time 03/29/2013 14:27     Colony Count >=100,000 COLONIES/ML     Culture GRAM NEGATIVE RODS     Report Status PENDING    TROPONIN I     Status: None   Collection Time    03/29/13  3:00 PM      Result Value Range   Troponin I <0.30  <0.30 ng/mL  TROPONIN I     Status: None   Collection Time    03/29/13 10:00 PM      Result Value Range   Troponin I <0.30  <0.30 ng/mL  TROPONIN I     Status: None   Collection Time    03/30/13  5:43 AM      Result Value Range   Troponin I <0.30  <0.30 ng/mL  BASIC METABOLIC PANEL     Status: Abnormal   Collection Time    03/30/13  5:43 AM      Result Value Range   Sodium 139  135 - 145 mEq/L   Potassium 4.6  3.5 - 5.1 mEq/L   Chloride 105  96 - 112 mEq/L   CO2 29  19 - 32 mEq/L   Glucose, Bld 136 (*) 70 - 99 mg/dL   BUN 16  6 - 23 mg/dL   Creatinine, Ser 1.61  0.50 - 1.35 mg/dL   Calcium 8.2 (*) 8.4 - 10.5 mg/dL   GFR calc non Af Amer >90  >90 mL/min   GFR calc Af Amer >90  >90 mL/min  CBC     Status: Abnormal   Collection Time    03/30/13  5:43 AM      Result Value Range   WBC 6.5  4.0 - 10.5 K/uL   RBC 4.21 (*) 4.22 - 5.81 MIL/uL   Hemoglobin 13.0  13.0 - 17.0 g/dL   HCT 09.6  04.5 - 40.9 %   MCV 92.6  78.0 - 100.0 fL   MCH 30.9  26.0 - 34.0 pg   MCHC 33.3  30.0 - 36.0 g/dL   RDW 81.1  91.4 - 78.2 %   Platelets 122 (*) 150 - 400 K/uL   Recent Results (from the past 240 hour(s))  MRSA PCR SCREENING     Status: None   Collection Time    03/28/13 12:23 PM      Result Value Range Status    MRSA by PCR NEGATIVE  NEGATIVE Final   Comment:            The GeneXpert MRSA Assay (FDA     approved for NASAL specimens     only), is one component of a     comprehensive MRSA colonization     surveillance program. It is not     intended to diagnose MRSA     infection nor to guide or     monitor treatment for     MRSA infections.  URINE CULTURE     Status: None   Collection Time    03/28/13 12:52 PM      Result Value Range Status   Specimen Description URINE, CLEAN CATCH   Final   Special  Requests Normal   Final   Culture  Setup Time 03/28/2013 23:07   Final   Colony Count >=100,000 COLONIES/ML   Final   Culture KLEBSIELLA PNEUMONIAE   Final   Report Status 03/29/2013 FINAL   Final   Organism ID, Bacteria KLEBSIELLA PNEUMONIAE   Final  CULTURE, BLOOD (ROUTINE X 2)     Status: None   Collection Time    03/29/13  8:44 AM      Result Value Range Status   Specimen Description BLOOD LEFT HAND   Final   Special Requests BOTTLES DRAWN AEROBIC ONLY [facility]   Final   Culture  Setup Time 03/29/2013 16:36   Final   Culture     Final   Value: GRAM NEGATIVE RODS     1610 Note: Gram Stain Report Called to,Read Back By and Verified With: Salomon Mast 03/30/2013  [facility]   Report Status PENDING   Incomplete  URINE CULTURE     Status: None   Collection Time    03/29/13  1:47 PM      Result Value Range Status   Specimen Description URINE, CLEAN CATCH   Final   Special Requests NONE   Final   Culture  Setup Time 03/29/2013 14:27   Final   Colony Count >=100,000 COLONIES/ML   Final   Culture GRAM NEGATIVE RODS   Final   Report Status PENDING   Incomplete    Renal Function:  Recent Labs  03/28/13 1349 03/29/13 0400 03/30/13 0543  CREATININE 1.07 0.96 0.82   Estimated Creatinine Clearance: 155.2 ml/min (by C-G formula based on Cr of 0.82).  Radiologic Imaging: No results found.   Impression/Recommendation: 1.) UTI/sepsis--continue ABx as directed by ID.  Pt denies obstructive  symptoms but could possibly have BPH/BOO/incomplete bladder emptying as cause of UTI.  Will check PVR to eval for bladder emptying.  Pt will need f/u as an outpt to ensure resolution of UTI as well as to have a rectal exam of prostate and PSA check.  PSA should not be checked now as infection will cause false elevation.  Pt may also need cysto and urodynamics in the future.   2.) Scrotal swelling--this is likely a hydrocele but will check scrotal U/S for complete eval. He denies pain and no intervention is necessary if hydrocele is found.   3.) History of gross hematuria--will check CT A/P w wo contrast to r/o masses as well as obstruction/stones as causes/contributions to UTI and hematuria.  Silas Flood 03/30/2013, 10:57 AM

## 2013-03-30 NOTE — Progress Notes (Signed)
CRITICAL VALUE ALERT  Critical value received:  +blood culture for aerobic bottle-gram neg rods  Date of notification:  03-30-13  Time of notification:  0728  Critical value read back:yes  Nurse who received alert:  Salomon Mast, RN  MD notified (1st page):  Earney Hamburg, MD (on unit at the time)  Time of first page:  [phone]

## 2013-03-30 NOTE — Progress Notes (Addendum)
0730 transfer order placed (RN still in report)  0745 performed shift assessment  0800 pt requests wash up via wife  0830 gave rocephin IV abx per order, pt receiving wash up with wife.  0845 attempted to call report, RN on 3W not available; said they would call back. Also waiting for pt to finish up breakfast.  0905 give pt 1000 meds, still no call back from 3W RN.  0920 second call to RN on 3W.  This time he is available.  Give report  0930 transfer pt to 3W.   Pt transferred to 3W32 via w/c, O2, and tele.  Wife with [place].  Pt belongings packed and transferred with pt.    Salomon Mast, RN

## 2013-03-30 NOTE — Progress Notes (Signed)
Patient ID: Ernest Martinez, male   DOB: [birth year], 57 y.o.   MRN: [id number]         [facility]    Date of Admission:  03/28/2013    Total days of antibiotics 3         Principal Problem:   Bacteremia due to Gram-negative bacteria Active Problems:   1St degree AV block   IVCD (intraventricular conduction defect)   HTN (hypertension)   Chest pain   Hyperglycemia   Morbid obesity   UTI (urinary tract infection)   Hydrocele, right   . amLODipine  10 mg Oral Daily  . aspirin EC  81 mg Oral Daily  . cefTRIAXone (ROCEPHIN)  IV  1 g Intravenous Q24H  . enoxaparin (LOVENOX) injection  0.5 mg/kg Subcutaneous Q24H  . furosemide  20 mg Oral q morning - 10a  . iohexol  25 mL Oral Q1 Hr x 2  . lisinopril  40 mg Oral Daily  . metoprolol tartrate  25 mg Oral BID  . pantoprazole  40 mg Oral Daily  . sodium chloride  3 mL Intravenous Q12H  . sodium chloride  3 mL Intravenous Q12H    Subjective: He is feeling a little better overall but is still feeling "woozy" with some mild generalized headache. His dysuria persists but is a little bit better.  Objective: Temp:  [98.2 F (36.8 C)-99.7 F (37.6 C)] 98.9 F (37.2 C) (07/08 0805) Pulse Rate:  [64-80] 77 (07/08 0911) Resp:  [18-28] 26 (07/08 0805) BP: (92-142)/(53-74) 130/69 mmHg (07/08 0912) SpO2:  [39 %-96 %] 96 % (07/08 0805) Weight:  [177 kg (390 lb 3.4 oz)] 177 kg (390 lb 3.4 oz) (07/08 0406)  General: He is alert and in no distress Skin: No rash Lungs: Clear Cor: Regular S1 and S2 no murmurs Abdomen: Obese, soft and nontender. No CVA tenderness. GU: No change in soft, nontender right scrotal swelling  Lab Results Lab Results  Component Value Date   WBC 6.5 03/30/2013   HGB 13.0 03/30/2013   HCT 39.0 03/30/2013   MCV 92.6 03/30/2013   PLT 122* 03/30/2013    Lab Results  Component Value Date   CREATININE 0.82 03/30/2013   BUN 16 03/30/2013   NA 139 03/30/2013   K 4.6 03/30/2013   CL 105 03/30/2013   CO2 29 03/30/2013    Lab Results  Component Value Date   ALT 22 03/29/2013   AST 20 03/29/2013   ALKPHOS 57 03/29/2013   BILITOT 0.7 03/29/2013      Microbiology: Recent Results (from the past 240 hour(s))  MRSA PCR SCREENING     Status: None   Collection Time    03/28/13 12:23 PM      Result Value Range Status   MRSA by PCR NEGATIVE  NEGATIVE Final   Comment:            The GeneXpert MRSA Assay (FDA     approved for NASAL specimens     only), is one component of a     comprehensive MRSA colonization     surveillance program. It is not     intended to diagnose MRSA     infection nor to guide or     monitor treatment for     MRSA infections.  URINE CULTURE     Status: None   Collection Time    03/28/13 12:52 PM      Result Value Range Status  Specimen Description URINE, CLEAN CATCH   Final   Special Requests Normal   Final   Culture  Setup Time 03/28/2013 23:07   Final   Colony Count >=100,000 COLONIES/ML   Final   Culture KLEBSIELLA PNEUMONIAE   Final   Report Status 03/29/2013 FINAL   Final   Organism ID, Bacteria KLEBSIELLA PNEUMONIAE   Final  CULTURE, BLOOD (ROUTINE X 2)     Status: None   Collection Time    03/29/13  8:44 AM      Result Value Range Status   Specimen Description BLOOD LEFT HAND   Final   Special Requests BOTTLES DRAWN AEROBIC ONLY [facility]   Final   Culture  Setup Time 03/29/2013 16:36   Final   Culture     Final   Value: GRAM NEGATIVE RODS     1610 Note: Gram Stain Report Called to,Read Back By and Verified With: Salomon Mast 03/30/2013  [facility]   Report Status PENDING   Incomplete  URINE CULTURE     Status: None   Collection Time    03/29/13  1:47 PM      Result Value Range Status   Specimen Description URINE, CLEAN CATCH   Final   Special Requests NONE   Final   Culture  Setup Time 03/29/2013 14:27   Final   Colony Count >=100,000 COLONIES/ML   Final   Culture GRAM NEGATIVE RODS   Final   Report Status PENDING   Incomplete   Assessment: He has a  bacteremic Klebsiella UTI. Blood cultures here and it more at hospital are positive for Klebsiella as are his urine cultures. I've already narrowed antibiotic therapy to IV ceftriaxone and will consider switching over to oral trimethoprim sulfamethoxazole tomorrow to complete 2 weeks of therapy.  Plan: 1. Continue ceftriaxone 2. Consider switch to trimethoprim sulfamethoxazole one double strength tablet twice daily tomorrow  Cliffton Asters, MD [facility] [phone] pager   [phone] cell 03/30/2013, 3:19 PM

## 2013-03-30 NOTE — Progress Notes (Signed)
SUBJECTIVE: c/o headache last night, still having dysuria and episodes of sweating. No chest pain or SOB  BP 122/57  Pulse 76  Temp(Src) 98.9 F (37.2 C) (Oral)  Resp 23  Ht 5\' 6"  (1.676 m)  Wt 390 lb 3.4 oz (177 kg)  BMI 63.01 kg/m2  SpO2 39%  Intake/Output Summary (Last 24 hours) at 03/30/13 0706 Last data filed at 03/29/13 0900  Gross per 24 hour  Intake    360 ml  Output      0 ml  Net    360 ml    PHYSICAL EXAM General: Well developed, well nourished, in no acute distress. Alert and oriented x 3.  Psych:  Good affect, responds appropriately Neck: No JVD. No masses noted.  Lungs: Clear bilaterally with no wheezes or rhonci noted.  Heart: RRR with no murmurs noted. Abdomen: Bowel sounds are present. Soft, non-tender.  Extremities: No lower extremity edema.   LABS: Basic Metabolic Panel:  Recent Labs  40/98/11 0400 03/30/13 0543  NA 134* 139  K 4.3 4.6  CL 101 105  CO2 28 29  GLUCOSE 161* 136*  BUN 18 16  CREATININE 0.96 0.82  CALCIUM 8.1* 8.2*   CBC:  Recent Labs  03/28/13 1349 03/30/13 0543  WBC 17.0* 6.5  HGB 13.6 13.0  HCT 39.3 39.0  MCV 89.7 92.6  PLT 145* 122*   Cardiac Enzymes:  Recent Labs  03/29/13 1500 03/29/13 2200 03/30/13 0543  TROPONINI <0.30 <0.30 <0.30   Current Meds: . amLODipine  10 mg Oral Daily  . aspirin EC  81 mg Oral Daily  . ceFEPime (MAXIPIME) IV  1 g Intravenous Q12H  . enoxaparin (LOVENOX) injection  0.5 mg/kg Subcutaneous Q24H  . furosemide  20 mg Oral q morning - 10a  . lisinopril  40 mg Oral Daily  . metoprolol tartrate  25 mg Oral BID  . pantoprazole  40 mg Oral Daily  . sodium chloride  3 mL Intravenous Q12H  . sodium chloride  3 mL Intravenous Q12H   Echo 03/29/13: Left ventricle: The cavity size was normal. Wall thickness was increased in a pattern of moderate LVH. Systolic function was normal. The estimated ejection fraction was in the range of 50% to 55%. Wall motion was normal; there were no  regional wall motion abnormalities. - Left atrium: The atrium was mildly dilated.  ASSESSMENT AND PLAN: 57 yo male with h/o HTN, GERD, morbid obesity transferred from [facility] with fever, chills, headache, diffuse body aches. He also has complaints of chest pressure after eating.   1. Chest Pain: Atypical. Does not seem cardiac related. Troponin negative. Echo with normal LV function. No objective evidence of ischemia. Continue PPI for possible GERD related chest pain. Continue ASA and beta blocker. Lovenox DVT prophylaxis.   2. Fever/chills/diffuse body aches: Urine culture positive for Klebsiella. Appreciate ID consult. Currently on cefepime. He has prior urological issues. He reports about a six-month history of right testicular swelling. He was seen by a urologist, Dr. Van Clines (now retired), in [place] about 6 months ago and told he had fluid building up on his right testicle and would need surgery. Initially the swelling would come and go but recently the swelling has stayed. It is nontender. When he started to get sick several days ago he c/o urinary frequency and dysuria. Will ask for urology consult given current presentation.   3. GERD: Continue proton pump inhibitor   4. HTN: Controlled. Continue home meds.  Transfer to telemetry unit today.       Ernest Martinez  7/8/20147:06 AM

## 2013-03-31 DIAGNOSIS — B961 Klebsiella pneumoniae [K. pneumoniae] as the cause of diseases classified elsewhere: Secondary | ICD-10-CM

## 2013-03-31 DIAGNOSIS — N12 Tubulo-interstitial nephritis, not specified as acute or chronic: Secondary | ICD-10-CM

## 2013-03-31 LAB — BASIC METABOLIC PANEL
BUN: 10 mg/dL (ref 6–23)
Calcium: 8.4 mg/dL (ref 8.4–10.5)
Creatinine, Ser: 0.77 mg/dL (ref 0.50–1.35)
GFR calc non Af Amer: >90 mL/min (ref >90)
Glucose, Bld: 157 mg/dL — ABNORMAL HIGH (ref 70–99)

## 2013-03-31 LAB — URINE CULTURE: Colony Count: >=100000

## 2013-03-31 LAB — CBC
Hemoglobin: 13.1 g/dL (ref 13.0–17.0)
MCH: 30.5 pg (ref 26.0–34.0)
MCHC: 32.9 g/dL (ref 30.0–36.0)
Platelets: 147 10*3/uL — ABNORMAL LOW (ref 150–400)
RDW: 12.8 % (ref 11.5–15.5)

## 2013-03-31 MED ORDER — SULFAMETHOXAZOLE-TMP DS 800-160 MG PO TABS
1.0000 | ORAL_TABLET | Freq: Two times a day (BID) | ORAL | Status: DC
  Administered 2013-03-31 (×2): 1 via ORAL
  Filled 2013-03-31 (×5): qty 1

## 2013-03-31 NOTE — Progress Notes (Signed)
Patient ID: Ernest Martinez, male   DOB: [birth year], 57 y.o.   MRN: [id number]   Subjective: Patient reports no new complaints or issues. He had uneventful night. He is now status post scrotal ultrasound as well as CT of the abdomen and pelvis with and without contrast.                                     v                                                                                   Objective: Vital signs in last 24 hours: Temp:  [98.5 F (36.9 C)-98.8 F (37.1 C)] 98.5 F (36.9 C) (07/09 0541) Pulse Rate:  [72-77] 77 (07/09 0541) Resp:  [18-20] 20 (07/09 0541) BP: (135-152)/(83-93) 152/93 mmHg (07/09 0541) SpO2:  [92 %-96 %] 92 % (07/09 0541)  Intake/Output from previous day: 07/08 0701 - 07/09 0700 In: 410 [P.O.:360; IV Piggyback:50] Out: -  Intake/Output this shift: Total I/O In: 360 [P.O.:360] Out: -   Physical Exam:  Constitutional: Vital signs reviewed. WD WN in NAD   Eyes: PERRL, No scleral icterus.   Cardiovascular: RRR Pulmonary/Chest: Normal effort Abdominal: Soft.  Genitourinary: no change.     Lab Results:  Recent Labs  03/28/13 1349 03/30/13 0543 03/31/13 0450  HGB 13.6 13.0 13.1  HCT 39.3 39.0 39.8   BMET  Recent Labs  03/30/13 0543 03/31/13 0450  NA 139 139  K 4.6 4.3  CL 105 103  CO2 29 32  GLUCOSE 136* 157*  BUN 16 10  CREATININE 0.82 0.77  CALCIUM 8.2* 8.4   No results found for this basename: LABPT, INR,  in the last 72 hours No results found for this basename: LABURIN,  in the last 72 hours Results for orders placed during the hospital encounter of 03/28/13  MRSA PCR SCREENING     Status: None   Collection Time    03/28/13 12:23 PM      Result Value Range Status   MRSA by PCR NEGATIVE  NEGATIVE Final   Comment:            The GeneXpert MRSA Assay (FDA     approved for NASAL specimens     only), is one component of a     comprehensive MRSA colonization     surveillance program. It is not     intended to diagnose MRSA      infection nor to guide or     monitor treatment for     MRSA infections.  URINE CULTURE     Status: None   Collection Time    03/28/13 12:52 PM      Result Value Range Status   Specimen Description URINE, CLEAN CATCH   Final   Special Requests Normal   Final   Culture  Setup Time 03/28/2013 23:07   Final   Colony Count >=100,000 COLONIES/ML   Final   Culture KLEBSIELLA PNEUMONIAE   Final   Report Status 03/29/2013 FINAL   Final   Organism ID, Bacteria KLEBSIELLA PNEUMONIAE  Final  CULTURE, BLOOD (ROUTINE X 2)     Status: None   Collection Time    03/29/13  8:44 AM      Result Value Range Status   Specimen Description BLOOD LEFT HAND   Final   Special Requests BOTTLES DRAWN AEROBIC ONLY [facility]   Final   Culture  Setup Time 03/29/2013 16:36   Final   Culture     Final   Value: GRAM NEGATIVE RODS     1610 Note: Gram Stain Report Called to,Read Back By and Verified With: Salomon Mast 03/30/2013  [facility]   Report Status PENDING   Incomplete  URINE CULTURE     Status: None   Collection Time    03/29/13  1:47 PM      Result Value Range Status   Specimen Description URINE, CLEAN CATCH   Final   Special Requests NONE   Final   Culture  Setup Time 03/29/2013 14:27   Final   Colony Count >=100,000 COLONIES/ML   Final   Culture GRAM NEGATIVE RODS   Final   Report Status PENDING   Incomplete    Studies/Results: Ct Abdomen Pelvis W Wo Contrast  03/30/2013   *RADIOLOGY REPORT*  Clinical Data: Evaluate for bowel obstruction and assess source of hematuria  CT ABDOMEN AND PELVIS WITHOUT AND WITH CONTRAST  Technique:  Multidetector CT imaging of the abdomen and pelvis was performed without contrast material in one or both body regions, followed by contrast material(s) and further sections in one or both body regions.  Contrast: OMNIPAQUE IOHEXOL 300 MG/ML  SOLN  Comparison: None.  Findings: The lung bases are clear.  No pericardial or pleural effusion identified.  No focal liver  abnormalities identified.  The gallbladder is normal.  No biliary dilatation.  Normal appearance of the pancreas. Normal appearance of the spleen.  The adrenal glands are both normal.  No nephrolithiasis or evidence of hydronephrosis.  The urinary bladder is normal.  There is a rounded filling defect within the bladder base measuring 1.9 cm. This is indeterminate and may be related to prostate gland hypertrophy.  Urothelial lesion is not excluded.  Normal caliber of the abdominal aorta.  There is no aneurysm identified.  There is no upper abdominal adenopathy.  The stomach is normal. The small bowel loops are unremarkable.  No evidence for bowel obstruction.  The appendix is visualized and appears normal. Normal appearance of the colon.  Review of the visualized osseous structures is remarkable for degenerative disc disease at the L5 S1 level.  IMPRESSION:  1.  There is a indeterminant filling defect near the bladder base which may be related to prostate gland hypertrophy.  A urothelial lesion cannot be excluded.  If this patient has hematuria then I would advise correlation with direct visualization. 2.  No evidence for nephrolithiasis or obstructive uropathy.   Original Report Authenticated By: Signa Kell, M.D.   US Scrotum  03/30/2013   *RADIOLOGY REPORT*  Clinical Data:  Evaluate right scrotal enlargement.  Swelling. Symptoms off and on for 6 months.  SCROTAL ULTRASOUND DOPPLER ULTRASOUND OF THE TESTICLES  Technique: Complete ultrasound examination of the testicles, epididymis, and other scrotal structures was performed.  Color and spectral Doppler ultrasound were also utilized to evaluate blood flow to the testicles.  Comparison:  None  Findings:  Right testis:  3.8 x 2.8 x 2.5 cm.  Normal echogenicity.  No mass.  Left testis:  4.0 x 2.0 x 3.1 cm.  Normal echogenicity.  No mass.  Right epididymis:  Difficult to visualize but normal appearance.  Left epididymis:  Small epididymal cyst/spermatocele 1.1 x 0.8  x 0.6 cm.  Hydrocele:  Large right hydrocele is present.  Small left hydrocele is present.  Varicocele:  Absent  Pulsed Doppler interrogation of both testes demonstrates low resistance flow bilaterally.  IMPRESSION:  1.  No evidence for testicular mass or torsion. 2.  Small left epididymal cysts/ spermatocele. 3.  Large right hydrocele and small left hydrocele.   Original Report Authenticated By: Norva Pavlov, M.D.   [place] Art/ven Flow Abd Pelv Doppler  03/30/2013   *RADIOLOGY REPORT*  Clinical Data:  Evaluate right scrotal enlargement.  Swelling. Symptoms off and on for 6 months.  SCROTAL ULTRASOUND DOPPLER ULTRASOUND OF THE TESTICLES  Technique: Complete ultrasound examination of the testicles, epididymis, and other scrotal structures was performed.  Color and spectral Doppler ultrasound were also utilized to evaluate blood flow to the testicles.  Comparison:  None  Findings:  Right testis:  3.8 x 2.8 x 2.5 cm.  Normal echogenicity.  No mass.  Left testis:  4.0 x 2.0 x 3.1 cm.  Normal echogenicity.  No mass.  Right epididymis:  Difficult to visualize but normal appearance.  Left epididymis:  Small epididymal cyst/spermatocele 1.1 x 0.8 x 0.6 cm.  Hydrocele:  Large right hydrocele is present.  Small left hydrocele is present.  Varicocele:  Absent  Pulsed Doppler interrogation of both testes demonstrates low resistance flow bilaterally.  IMPRESSION:  1.  No evidence for testicular mass or torsion. 2.  Small left epididymal cysts/ spermatocele. 3.  Large right hydrocele and small left hydrocele.   Original Report Authenticated By: Norva Pavlov, M.D.    Assessment/Plan:   #1 UTI/urosepsis. The patient is not appear to have any definitive structural abnormalities based on CT imaging of his abdomen and pelvis. No evidence of renal obstruction/nephrolithiasis or incomplete bladder emptying. #2 history of gross hematuria. CT imaging of the pelvis does demonstrate a filling defect at the base of the bladder.  This may represent a middle lobe of the prostate but a transitional cell carcinoma at the bladder base cannot be excluded. I discussed this with the patient and family today. It is quite important that he have a nonurgent flexible cystoscopy in our office. I would anticipate followup 2-4 weeks status post his discharge for urinalysis reassessment and flexible cystoscopy to assess this possible bladder mass. #3 scrotal swelling/hydrocele. Scrotal ultrasound confirms a large hydrocele. We would recommend observation unless this becomes more clinically problematic for the patient. Given his obesity/body habitus as well as medical core morbidities he may be a candidate for attempt at percutaneous drainage with possible sclerosis procedure. We will discuss this more with him at followup.  Today's visit was 1 primarily of discussion. We spent approximately 25 minutes of face-to-face time going over these issues today.   LOS: 3 days   Aleanna Menge S 03/31/2013, 10:30 AM

## 2013-03-31 NOTE — Progress Notes (Signed)
Patient ID: Ernest Martinez, male   DOB: 1956-06-20, 57 y.o.   MRN: [id number]         [facility]    Date of Admission:  03/28/2013           Day 4 total antibiotics  Principal Problem:   Bacteremia due to Gram-negative bacteria Active Problems:   1St degree AV block   IVCD (intraventricular conduction defect)   HTN (hypertension)   Chest pain   Hyperglycemia   Morbid obesity   UTI (urinary tract infection)   Hydrocele, right  Subjective: He is feeling much better. Most of his dysuria has resolved. He denies any back pain. He has not had any nausea or vomiting and is eating well. He had some mild chills last night but had no recorded fever.  Objective: Temp:  [98.5 F (36.9 C)-98.8 F (37.1 C)] 98.5 F (36.9 C) (07/09 0541) Pulse Rate:  [72-77] 77 (07/09 0541) Resp:  [18-20] 20 (07/09 0541) BP: (135-152)/(83-93) 152/93 mmHg (07/09 0541) SpO2:  [92 %-96 %] 92 % (07/09 0541)  General: He looks better. He is alert and sitting up beside his bed Skin: No rash Lungs: Clear Cor: Regular S1 and S2 no murmurs Abdomen: Obese, soft and nontender. No CVA tenderness   Lab Results Lab Results  Component Value Date   WBC 5.9 03/31/2013   HGB 13.1 03/31/2013   HCT 39.8 03/31/2013   MCV 92.6 03/31/2013   PLT 147* 03/31/2013    Microbiology: Recent Results (from the past 240 hour(s))  MRSA PCR SCREENING     Status: None   Collection Time    03/28/13 12:23 PM      Result Value Range Status   MRSA by PCR NEGATIVE  NEGATIVE Final   Comment:            The GeneXpert MRSA Assay (FDA     approved for NASAL specimens     only), is one component of a     comprehensive MRSA colonization     surveillance program. It is not     intended to diagnose MRSA     infection nor to guide or     monitor treatment for     MRSA infections.  URINE CULTURE     Status: None   Collection Time    03/28/13 12:52 PM      Result Value Range Status   Specimen Description  URINE, CLEAN CATCH   Final   Special Requests Normal   Final   Culture  Setup Time 03/28/2013 23:07   Final   Colony Count >=100,000 COLONIES/ML   Final   Culture KLEBSIELLA PNEUMONIAE   Final   Report Status 03/29/2013 FINAL   Final   Organism ID, Bacteria KLEBSIELLA PNEUMONIAE   Final  CULTURE, BLOOD (ROUTINE X 2)     Status: None   Collection Time    03/29/13  8:44 AM      Result Value Range Status   Specimen Description BLOOD LEFT HAND   Final   Special Requests BOTTLES DRAWN AEROBIC ONLY [facility]   Final   Culture  Setup Time 03/29/2013 16:36   Final   Culture     Final   Value: GRAM NEGATIVE RODS     4098 Note: Gram Stain Report Called to,Read Back By and Verified With: Salomon Mast 03/30/2013  [facility]   Report Status PENDING   Incomplete  URINE CULTURE     Status: None  Collection Time    03/29/13  1:47 PM      Result Value Range Status   Specimen Description URINE, CLEAN CATCH   Final   Special Requests NONE   Final   Culture  Setup Time 03/29/2013 14:27   Final   Colony Count >=100,000 COLONIES/ML   Final   Culture GRAM NEGATIVE RODS   Final   Report Status PENDING   Incomplete    Studies/Results: Ct Abdomen Pelvis W Wo Contrast  03/30/2013   *RADIOLOGY REPORT*  Clinical Data: Evaluate for bowel obstruction and assess source of hematuria  CT ABDOMEN AND PELVIS WITHOUT AND WITH CONTRAST  Technique:  Multidetector CT imaging of the abdomen and pelvis was performed without contrast material in one or both body regions, followed by contrast material(s) and further sections in one or both body regions.  Contrast: OMNIPAQUE IOHEXOL 300 MG/ML  SOLN  Comparison: None.  Findings: The lung bases are clear.  No pericardial or pleural effusion identified.  No focal liver abnormalities identified.  The gallbladder is normal.  No biliary dilatation.  Normal appearance of the pancreas. Normal appearance of the spleen.  The adrenal glands are both normal.  No nephrolithiasis or evidence of  hydronephrosis.  The urinary bladder is normal.  There is a rounded filling defect within the bladder base measuring 1.9 cm. This is indeterminate and may be related to prostate gland hypertrophy.  Urothelial lesion is not excluded.  Normal caliber of the abdominal aorta.  There is no aneurysm identified.  There is no upper abdominal adenopathy.  The stomach is normal. The small bowel loops are unremarkable.  No evidence for bowel obstruction.  The appendix is visualized and appears normal. Normal appearance of the colon.  Review of the visualized osseous structures is remarkable for degenerative disc disease at the L5 S1 level.  IMPRESSION:  1.  There is a indeterminant filling defect near the bladder base which may be related to prostate gland hypertrophy.  A urothelial lesion cannot be excluded.  If this patient has hematuria then I would advise correlation with direct visualization. 2.  No evidence for nephrolithiasis or obstructive uropathy.   Original Report Authenticated By: Signa Kell, M.D.   US Scrotum  03/30/2013   *RADIOLOGY REPORT*  Clinical Data:  Evaluate right scrotal enlargement.  Swelling. Symptoms off and on for 6 months.  SCROTAL ULTRASOUND DOPPLER ULTRASOUND OF THE TESTICLES  Technique: Complete ultrasound examination of the testicles, epididymis, and other scrotal structures was performed.  Color and spectral Doppler ultrasound were also utilized to evaluate blood flow to the testicles.  Comparison:  None  Findings:  Right testis:  3.8 x 2.8 x 2.5 cm.  Normal echogenicity.  No mass.  Left testis:  4.0 x 2.0 x 3.1 cm.  Normal echogenicity.  No mass.  Right epididymis:  Difficult to visualize but normal appearance.  Left epididymis:  Small epididymal cyst/spermatocele 1.1 x 0.8 x 0.6 cm.  Hydrocele:  Large right hydrocele is present.  Small left hydrocele is present.  Varicocele:  Absent  Pulsed Doppler interrogation of both testes demonstrates low resistance flow bilaterally.  IMPRESSION:   1.  No evidence for testicular mass or torsion. 2.  Small left epididymal cysts/ spermatocele. 3.  Large right hydrocele and small left hydrocele.   Original Report Authenticated By: Norva Pavlov, M.D.   [place] Art/ven Flow Abd Pelv Doppler  03/30/2013   *RADIOLOGY REPORT*  Clinical Data:  Evaluate right scrotal enlargement.  Swelling. Symptoms off  and on for 6 months.  SCROTAL ULTRASOUND DOPPLER ULTRASOUND OF THE TESTICLES  Technique: Complete ultrasound examination of the testicles, epididymis, and other scrotal structures was performed.  Color and spectral Doppler ultrasound were also utilized to evaluate blood flow to the testicles.  Comparison:  None  Findings:  Right testis:  3.8 x 2.8 x 2.5 cm.  Normal echogenicity.  No mass.  Left testis:  4.0 x 2.0 x 3.1 cm.  Normal echogenicity.  No mass.  Right epididymis:  Difficult to visualize but normal appearance.  Left epididymis:  Small epididymal cyst/spermatocele 1.1 x 0.8 x 0.6 cm.  Hydrocele:  Large right hydrocele is present.  Small left hydrocele is present.  Varicocele:  Absent  Pulsed Doppler interrogation of both testes demonstrates low resistance flow bilaterally.  IMPRESSION:  1.  No evidence for testicular mass or torsion. 2.  Small left epididymal cysts/ spermatocele. 3.  Large right hydrocele and small left hydrocele.   Original Report Authenticated By: Norva Pavlov, M.D.    Assessment: He is improving on therapy for Klebsiella bacteremia and pyelonephritis. His CT scan does not show any stones or other form of obstruction. He can be changed to oral trimethoprim sulfamethoxazole one double strength tablet twice a day and plan on treatment for 10 more days.  Plan: 1. Change to oral trimethoprim sulfamethoxazole one double strength tablet twice a day for 10 more days 2. Please call if we can be of further assistance while he is here  Cliffton Asters, MD [facility] [phone] pager    [phone] cell 03/31/2013, 2:19 PM

## 2013-03-31 NOTE — Care Management Note (Unsigned)
    Page 1 of 1   03/31/2013     11:06:07 AM   CARE MANAGEMENT NOTE 03/31/2013  Patient:  Ernest Martinez, Ernest Martinez   Account Number:  [id number]  Date Initiated:  03/31/2013  Documentation initiated by:  GRAVES-BIGELOW,Gianluca Chhim  Subjective/Objective Assessment:   Pt admitted for fevers are due to a bacteremic gram-negative rod urinary tract infection. On Iv ABX therapy. Tx from stepdown unit. ID following pt.     Action/Plan:   CM will continue to monitor for disposition needs if any.   Anticipated DC Date:  04/01/2013   Anticipated DC Plan:  HOME/SELF CARE      DC Planning Services  CM consult      Choice offered to / List presented to:             Status of service:  In process, will continue to follow Medicare Important Message given?   (If response is "NO", the following Medicare IM given date fields will be blank) Date Medicare IM given:   Date Additional Medicare IM given:    Discharge Disposition:    Per UR Regulation:  Reviewed for med. necessity/level of care/duration of stay  If discussed at Long Length of Stay Meetings, dates discussed:    Comments:

## 2013-03-31 NOTE — Progress Notes (Signed)
    SUBJECTIVE: Still having episodes of sweating. No chest pain.   BP 152/93  Pulse 77  Temp(Src) 98.5 F (36.9 C) (Oral)  Resp 20  Ht 5\' 6"  (1.676 m)  Wt 390 lb 3.4 oz (177 kg)  BMI 63.01 kg/m2  SpO2 92%  Intake/Output Summary (Last 24 hours) at 03/31/13 0757 Last data filed at 03/30/13 0850  Gross per 24 hour  Intake    410 ml  Output      0 ml  Net    410 ml    PHYSICAL EXAM General: Well developed, well nourished, in no acute distress. Alert and oriented x 3.  Psych:  Good affect, responds appropriately Neck: No JVD. No masses noted.  Lungs: Clear bilaterally with no wheezes or rhonci noted.  Heart: RRR with no murmurs noted. Abdomen: Bowel sounds are present. Soft, non-tender.  Extremities: No lower extremity edema.   LABS: Basic Metabolic Panel:  Recent Labs  16/10/96 0543 03/31/13 0450  NA 139 139  K 4.6 4.3  CL 105 103  CO2 29 32  GLUCOSE 136* 157*  BUN 16 10  CREATININE 0.82 0.77  CALCIUM 8.2* 8.4   CBC:  Recent Labs  03/30/13 0543 03/31/13 0450  WBC 6.5 5.9  HGB 13.0 13.1  HCT 39.0 39.8  MCV 92.6 92.6  PLT 122* 147*   Cardiac Enzymes:  Recent Labs  03/29/13 1500 03/29/13 2200 03/30/13 0543  TROPONINI <0.30 <0.30 <0.30   Current Meds: . amLODipine  10 mg Oral Daily  . aspirin EC  81 mg Oral Daily  . cefTRIAXone (ROCEPHIN)  IV  1 g Intravenous Q24H  . enoxaparin (LOVENOX) injection  0.5 mg/kg Subcutaneous Q24H  . furosemide  20 mg Oral q morning - 10a  . lisinopril  40 mg Oral Daily  . metoprolol tartrate  25 mg Oral BID  . pantoprazole  40 mg Oral Daily  . sodium chloride  3 mL Intravenous Q12H   Scrotal u/s: 03/30/13: 1. No evidence for testicular mass or torsion.  2. Small left epididymal cysts/ spermatocele.  3. Large right hydrocele and small left hydrocele.  CT abd/pelvis: 03/30/13: 1. There is a indeterminant filling defect near the bladder base  which may be related to prostate gland hypertrophy. A urothelial  lesion  cannot be excluded. If this patient has hematuria then I  would advise correlation with direct visualization.  2. No evidence for nephrolithiasis or obstructive uropathy.    ASSESSMENT AND PLAN: 57 yo male with h/o HTN, GERD, morbid obesity transferred from [facility] with fever, chills, headache, diffuse body aches and chest pressure after eating.   1. Chest Pain: Atypical. Does not seem cardiac related. Troponin negative. Echo with normal LV function. No objective evidence of ischemia. Continue PPI for possible GERD related chest pain. Continue ASA and beta blocker. Lovenox DVT prophylaxis.   2. Klebsiella UTI: Appreciate ID and urology consults. Antibiotic management per ID. Urological workup in progress. Will follow recs of the consult teams regarding management of his UTI, probably hydrocele.   3. GERD: Continue proton pump inhibitor      Ernest Martinez,Ernest Martinez  7/9/20147:57 AM

## 2013-04-01 ENCOUNTER — Encounter (HOSPITAL_COMMUNITY): Payer: Self-pay | Admitting: Physician Assistant

## 2013-04-01 DIAGNOSIS — N433 Hydrocele, unspecified: Secondary | ICD-10-CM

## 2013-04-01 LAB — CULTURE, BLOOD (ROUTINE X 2)

## 2013-04-01 MED ORDER — SULFAMETHOXAZOLE-TMP DS 800-160 MG PO TABS: 1.0000 | ORAL_TABLET | Freq: Two times a day (BID) | ORAL | Status: DC

## 2013-04-01 MED ORDER — ASPIRIN 81 MG PO TBEC: 81.0000 mg | DELAYED_RELEASE_TABLET | Freq: Every day | ORAL | Status: DC

## 2013-04-01 MED ORDER — HYDRALAZINE HCL 25 MG PO TABS: 25.0000 mg | ORAL_TABLET | Freq: Three times a day (TID) | ORAL | Status: DC

## 2013-04-01 MED ORDER — HYDRALAZINE HCL 25 MG PO TABS
25.0000 mg | ORAL_TABLET | Freq: Three times a day (TID) | ORAL | Status: DC
  Administered 2013-04-01: 25 mg via ORAL
  Filled 2013-04-01 (×4): qty 1

## 2013-04-01 NOTE — Progress Notes (Signed)
Cardiologist on call notified of pt having a 2.47s pause as well hr dropping to 31. Pt asymptomatic & sleeping. Pt hr came directly back up to the 60s. No new orders given. Will continue to monitor the pt. Sanda Linger, RN

## 2013-04-01 NOTE — Discharge Summary (Signed)
See full note this am. cdm 

## 2013-04-01 NOTE — Progress Notes (Signed)
SUBJECTIVE: Feeling better. Still with headache overnight. No chest pain or SOB. Some sweating.   BP 160/75  Pulse 77  Temp(Src) 97.9 F (36.6 C) (Oral)  Resp 20  Ht 5\' 6"  (1.676 m)  Wt 390 lb 3.4 oz (177 kg)  BMI 63.01 kg/m2  SpO2 91%  Intake/Output Summary (Last 24 hours) at 04/01/13 1610 Last data filed at 03/31/13 2241  Gross per 24 hour  Intake   1080 ml  Output      0 ml  Net   1080 ml    PHYSICAL EXAM General: Obese WM, in no acute distress. Alert and oriented x 3.  Psych:  Good affect, responds appropriately Neck: No JVD. No masses noted.  Lungs: Clear bilaterally with no wheezes or rhonci noted.  Heart: RRR with no murmurs noted. Abdomen: Bowel sounds are present. Soft, non-tender.  Extremities: No lower extremity edema.   LABS: Basic Metabolic Panel:  Recent Labs  96/04/54 0543 03/31/13 0450  NA 139 139  K 4.6 4.3  CL 105 103  CO2 29 32  GLUCOSE 136* 157*  BUN 16 10  CREATININE 0.82 0.77  CALCIUM 8.2* 8.4   CBC:  Recent Labs  03/30/13 0543 03/31/13 0450  WBC 6.5 5.9  HGB 13.0 13.1  HCT 39.0 39.8  MCV 92.6 92.6  PLT 122* 147*   Cardiac Enzymes:  Recent Labs  03/29/13 1500 03/29/13 2200 03/30/13 0543  TROPONINI <0.30 <0.30 <0.30   Current Meds: . amLODipine  10 mg Oral Daily  . aspirin EC  81 mg Oral Daily  . enoxaparin (LOVENOX) injection  0.5 mg/kg Subcutaneous Q24H  . furosemide  20 mg Oral q morning - 10a  . lisinopril  40 mg Oral Daily  . pantoprazole  40 mg Oral Daily  . sodium chloride  3 mL Intravenous Q12H  . sulfamethoxazole-trimethoprim  1 tablet Oral Q12H    ASSESSMENT AND PLAN: 57 yo male with h/o HTN, GERD, morbid obesity transferred from [facility] with fever, chills, headache, diffuse body aches and chest pressure after eating.   1. Chest Pain: Atypical. Does not seem cardiac related. Troponin negative. Echo with normal LV function. No objective evidence of ischemia. Continue PPI for possible GERD  related chest pain. Continue ASA.    2. Klebsiella UTI: Appreciate ID and urology consults. He will be discharged with 10 day course of trimethoprim sulfamethoxazole one double strength tablet twice a day for 9 more days. Urological workup in progress. The patient is not appear to have any definitive structural abnormalities based on CT imaging of his abdomen and pelvis. No evidence of renal obstruction/nephrolithiasis or incomplete bladder emptying. CT imaging of the pelvis does demonstrate a filling defect at the base of the bladder. This may represent a middle lobe of the prostate but a transitional cell carcinoma at the bladder base cannot be excluded. Follow up with Dr. Isabel Caprice with urology in 2-3 weeks for urinalysis reassessment and flexible cystoscopy to assess this possible bladder mass. Scrotal ultrasound confirms a large hydrocele and this will be managed conservatively for now. This will be followed by urology.   3. GERD: Continue proton pump inhibitor  4. HTN: BP elevated off of beta blocker. Beta blocker stopped yesterday as patient believed it caused him to have headaches. He had tried at home and stopped it due to headaches then it was restarted here and he had recurrence of headaches. Will start hydralazine 25 mg po TID.   5. Dispo: D/C home  today. No cardiac follow up except as planned at last visit. He will need f/u with Dr. Isabel Caprice in [facility] in 2-3 weeks. New medications for discharge will be Hydralazine and Septra DS BID (9 more days).  MCALHANY,CHRISTOPHER  7/10/20147:02 AM

## 2013-04-01 NOTE — Discharge Summary (Signed)
Discharge Summary   Patient ID: Ernest Martinez MRN: [id number], DOB/AGE: 1956/09/06 57 y.o. Admit date: 03/28/2013 D/C date:     04/01/2013  Primary Cardiologist: Clifton James  Primary Discharge Diagnoses:  1. Chest pain, atypical, consider GERD 2. Kelbsiella UTI with bacteremia 3. GERD 4. HTN 5. 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 Filling defect at base of bladder by CT pelvis - for op uro followup 7. Morbid obesity BMI 62.6  Secondary Discharge Diagnoses:  1. Borderline diabetes mellitus  Hospital Course: Ernest Martinez is a 57 y/o M with history of HTN, borderline diabetes mellitus who was transferred from [facility] to [facility] 03/28/2013 for headache, fever, and chest pain. He has no history of CAD, stress test or cath. He was previously evaluated by Dr. Clifton James in the office for abnormal EKG with subsequent echo showing normal EF and mod LVH. The patient presented with complaints this admission with mild headache for 24 hours, fever to 104 at home, then developed pressure in his chest.  He denied any cough, sputum, rash or neck stiffness. WBC was mildly elevated at 12.8 and K was 2.9 which was repleted. Troponin was 0.03 and CXR was reportedly normal. EKG showed SR rate 101 LAD LVH and IVCD PR 204. He was admitted for cardiac rule out. Troponins remained negative. 2D Echo showed EF 50-55% with moderate LVH. His chest pain was felt atypical and did not seem cardiac. PPI was continued for possible GERD-related chest pain.  With regard to fever, he was initially empirically started on doxycycline due to multiple tick bites. Infectious disease consulted on the patient and changed him to ceftriaxone as Dr. Orvan Falconer felt his fevers were due to a bacteremic gram-negative rod UTI.  This was later determined to be klebsiella. Lyme titer was negative. As the patient also reported occasional testicular swelling, urology was consulted. They noted a  history of gross hematuria as well. CT imaging of abd/pelvis was performed and the patient did not appear to have any definitive structural abnormalities. There was no evidence of renal obstruction/nephrolithiasis or incomplete bladder emptying. CT pelvis did show a filling defect at the base of the blatter - this may represent middle lobe of the prostate but a transitional cell carcinoma at the bladder base cannot be excluded. Dr. Isabel Caprice stated it was quite important that he have a nonurgent flexible cystoscopy in our office. He recommended follow-up 2-4 weeks post-discharge for urinalysis reassessment and flexible cystoscopy to assess this possible bladder mass. Scotal ultrasound confirmed a large hydrocele. Dr. Isabel Caprice recommended observation unless this becomes more clinically problematic for the patient. Given his obesity/body habitus as well as medical core morbidities he may be a candidate for attempt at percutaneous drainage with possible sclerosis procedure. Dr. Isabel Caprice will discuss this more with him at followup. Dr. Orvan Falconer recommended changing antibiotic to Bactrim DS 1 bid x 10 more days (from 7/9). From a cardiac standpoint, the patient has remained stable. Beta blocker was stopped during this admission as the patient believed it was causing him to have headaches. (He had tried it at home and stopped it due to headaches, then it was restarted here and he had recurrence of headaches.) He has subsequently been started on hydralazine for blood pressure.  Continued low dose ASA was recommended. Dr. Clifton James has seen and examined the patient today and feels he is stable for discharge. I have left a message on our office's scheduling voicemail requesting a follow-up appointment,  and our office will call the patient with this appointment (previously planned cardiac followup per last OV with Dr. Clifton James). The patient was instructed to f/u with Dr. Isabel Caprice as directed.   Discharge Vitals: Blood pressure  160/75, pulse 77, temperature 97.9 F (36.6 C), temperature source Oral, resp. rate 20, height 5\' 6"  (1.676 m), weight 390 lb 3.4 oz (177 kg), SpO2 91.00%.  Labs: Lab Results  Component Value Date   WBC 5.9 03/31/2013   HGB 13.1 03/31/2013   HCT 39.8 03/31/2013   MCV 92.6 03/31/2013   PLT 147* 03/31/2013    Recent Labs Lab 03/29/13 0400  03/31/13 0450  NA 134*  < > 139  K 4.3  < > 4.3  CL 101  < > 103  CO2 28  < > 32  BUN 18  < > 10  CREATININE 0.96  < > 0.77  CALCIUM 8.1*  < > 8.4  PROT 6.3  --   --   BILITOT 0.7  --   --   ALKPHOS 57  --   --   ALT 22  --   --   AST 20  --   --   GLUCOSE 161*  < > 157*  < > = values in this interval not displayed.  Recent Labs  03/29/13 0843 03/29/13 1500 03/29/13 2200 03/30/13 0543  TROPONINI <0.30 <0.30 <0.30 <0.30    Diagnostic Studies/Procedures   Ct Abdomen Pelvis W Wo Contrast 03/30/2013   *RADIOLOGY REPORT*  Clinical Data: Evaluate for bowel obstruction and assess source of hematuria  CT ABDOMEN AND PELVIS WITHOUT AND WITH CONTRAST  Technique:  Multidetector CT imaging of the abdomen and pelvis was performed without contrast material in one or both body regions, followed by contrast material(s) and further sections in one or both body regions.  Contrast: OMNIPAQUE IOHEXOL 300 MG/ML  SOLN  Comparison: None.  Findings: The lung bases are clear.  No pericardial or pleural effusion identified.  No focal liver abnormalities identified.  The gallbladder is normal.  No biliary dilatation.  Normal appearance of the pancreas. Normal appearance of the spleen.  The adrenal glands are both normal.  No nephrolithiasis or evidence of hydronephrosis.  The urinary bladder is normal.  There is a rounded filling defect within the bladder base measuring 1.9 cm. This is indeterminate and may be related to prostate gland hypertrophy.  Urothelial lesion is not excluded.  Normal caliber of the abdominal aorta.  There is no aneurysm identified.  There is no  upper abdominal adenopathy.  The stomach is normal. The small bowel loops are unremarkable.  No evidence for bowel obstruction.  The appendix is visualized and appears normal. Normal appearance of the colon.  Review of the visualized osseous structures is remarkable for degenerative disc disease at the L5 S1 level.  IMPRESSION:  1.  There is a indeterminant filling defect near the bladder base which may be related to prostate gland hypertrophy.  A urothelial lesion cannot be excluded.  If this patient has hematuria then I would advise correlation with direct visualization. 2.  No evidence for nephrolithiasis or obstructive uropathy.   Original Report Authenticated By: Signa Kell, M.D.   US Scrotum 03/30/2013   *RADIOLOGY REPORT*  Clinical Data:  Evaluate right scrotal enlargement.  Swelling. Symptoms off and on for 6 months.  SCROTAL ULTRASOUND DOPPLER ULTRASOUND OF THE TESTICLES  Technique: Complete ultrasound examination of the testicles, epididymis, and other scrotal structures was performed.  Color and spectral  Doppler ultrasound were also utilized to evaluate blood flow to the testicles.  Comparison:  None  Findings:  Right testis:  3.8 x 2.8 x 2.5 cm.  Normal echogenicity.  No mass.  Left testis:  4.0 x 2.0 x 3.1 cm.  Normal echogenicity.  No mass.  Right epididymis:  Difficult to visualize but normal appearance.  Left epididymis:  Small epididymal cyst/spermatocele 1.1 x 0.8 x 0.6 cm.  Hydrocele:  Large right hydrocele is present.  Small left hydrocele is present.  Varicocele:  Absent  Pulsed Doppler interrogation of both testes demonstrates low resistance flow bilaterally.  IMPRESSION:  1.  No evidence for testicular mass or torsion. 2.  Small left epididymal cysts/ spermatocele. 3.  Large right hydrocele and small left hydrocele.   Original Report Authenticated By: Norva Pavlov, M.D.   [place] Art/ven Flow Abd Pelv Doppler 03/30/2013   *RADIOLOGY REPORT*  Clinical Data:  Evaluate right scrotal  enlargement.  Swelling. Symptoms off and on for 6 months.  SCROTAL ULTRASOUND DOPPLER ULTRASOUND OF THE TESTICLES  Technique: Complete ultrasound examination of the testicles, epididymis, and other scrotal structures was performed.  Color and spectral Doppler ultrasound were also utilized to evaluate blood flow to the testicles.  Comparison:  None  Findings:  Right testis:  3.8 x 2.8 x 2.5 cm.  Normal echogenicity.  No mass.  Left testis:  4.0 x 2.0 x 3.1 cm.  Normal echogenicity.  No mass.  Right epididymis:  Difficult to visualize but normal appearance.  Left epididymis:  Small epididymal cyst/spermatocele 1.1 x 0.8 x 0.6 cm.  Hydrocele:  Large right hydrocele is present.  Small left hydrocele is present.  Varicocele:  Absent  Pulsed Doppler interrogation of both testes demonstrates low resistance flow bilaterally.  IMPRESSION:  1.  No evidence for testicular mass or torsion. 2.  Small left epididymal cysts/ spermatocele. 3.  Large right hydrocele and small left hydrocele.   Original Report Authenticated By: Norva Pavlov, M.D.   2D Echo 03/29/13 - Left ventricle: The cavity size was normal. Wall thickness was increased in a pattern of moderate LVH. Systolic function was normal. The estimated ejection fraction was in the range of 50% to 55%. Wall motion was normal; there were no regional wall motion abnormalities. - Left atrium: The atrium was mildly dilated.   Discharge Medications     Medication List    STOP taking these medications       metoprolol tartrate 25 MG tablet  Commonly known as:  LOPRESSOR      TAKE these medications       ADVIL 200 MG tablet  Generic drug:  ibuprofen  Take 600 mg by mouth daily as needed.     amLODipine 10 MG tablet  Commonly known as:  NORVASC  Take 10 mg by mouth daily.     aspirin 81 MG EC tablet  Take 1 tablet (81 mg total) by mouth daily.     furosemide 20 MG tablet  Commonly known as:  LASIX  Take 20 mg by mouth every morning.      hydrALAZINE 25 MG tablet  Commonly known as:  APRESOLINE  Take 1 tablet (25 mg total) by mouth every 8 (eight) hours.     lisinopril 40 MG tablet  Commonly known as:  PRINIVIL,ZESTRIL  Take 40 mg by mouth daily.     omeprazole 20 MG capsule  Commonly known as:  PRILOSEC  Take 20 mg by mouth daily.     sulfamethoxazole-trimethoprim 800-160  MG per tablet  Commonly known as:  BACTRIM DS  Take 1 tablet by mouth every 12 (twelve) hours. Take until full course is complete.        Disposition   The patient will be discharged in stable condition to home. Discharge Orders   Future Orders Complete By Expires     Diet - low sodium heart healthy  As directed     Increase activity slowly  As directed     Comments:      Monitor your blood pressureperiodically at home. If it tends to run low (less than 100 on top number) or high (greater than 140 on top number or greater than 90 on bottom number), call your doctor.      Follow-up Information   Follow up with [facility], MD. (Call office when you are discharged from the hospital for appointment in 2-3 weeks.)    Contact information:   [address]                         [place] [address] [phone]       Follow up with Verne Carrow, MD. (Our office will call you for a follow-up appointment as previously planned.)    Contact information:   [address] [place] [address] [phone]         Duration of Discharge Encounter: Greater than 30 minutes including physician and PA time.  Signed, Kriste Basque Dunn PA-C 04/01/2013, 8:28 AM

## 2013-05-12 ENCOUNTER — Other Ambulatory Visit: Payer: Self-pay | Admitting: Urology

## 2013-05-14 ENCOUNTER — Telehealth: Payer: Self-pay | Admitting: Family Medicine

## 2013-05-14 ENCOUNTER — Ambulatory Visit (INDEPENDENT_AMBULATORY_CARE_PROVIDER_SITE_OTHER): Payer: BC Managed Care – PPO | Admitting: Family Medicine

## 2013-05-14 ENCOUNTER — Encounter: Payer: Self-pay | Admitting: Family Medicine

## 2013-05-14 VITALS — BP 153/93 | HR 71 | Temp 98.3°F | Wt 395.0 lb

## 2013-05-14 DIAGNOSIS — J069 Acute upper respiratory infection, unspecified: Secondary | ICD-10-CM

## 2013-05-14 MED ORDER — BENZONATATE 100 MG PO CAPS: 100.0000 mg | ORAL_CAPSULE | Freq: Three times a day (TID) | ORAL | Status: DC | PRN

## 2013-05-14 MED ORDER — AZITHROMYCIN 250 MG PO TABS: ORAL_TABLET | ORAL | Status: DC

## 2013-05-14 NOTE — Progress Notes (Signed)
  Subjective:    Patient ID: Hieu Herms, male    DOB: [birth year], 57 y.o.   MRN: [id number]  HPI This 57 y.o. male presents for evaluation of cough and uri sx's for over a week. He has been having sinus congestion and post nasal gtt.   Review of Systems    No chest pain, SOB, HA, dizziness, vision change, N/V, diarrhea, constipation, dysuria, urinary urgency or frequency, myalgias, arthralgias or rash.  Objective:   Physical Exam  Vital signs noted  Well developed well nourished obese male.  HEENT - Head atraumatic Normocephalic                Eyes - PERRLA, Conjuctiva - clear Sclera- Clear EOMI                Ears - EAC's Wnl TM's Wnl Gross Hearing WNL                Nose - Nares patent                 Throat - oropharanx wnl Respiratory - Lungs diminished throughout due to body habitus. Cardiac - RRR S1 and S2 without murmur       Assessment & Plan:  URI (upper respiratory infection) - Plan: azithromycin (ZITHROMAX) 250 MG tablet, benzonatate (TESSALON PERLES) 100 MG capsule.  WSWG's prn and tylenol and motrin otc prn.

## 2013-05-14 NOTE — Patient Instructions (Signed)

## 2013-05-14 NOTE — Telephone Encounter (Signed)
appt made

## 2013-05-24 DIAGNOSIS — S43004A Unspecified dislocation of right shoulder joint, initial encounter: Secondary | ICD-10-CM

## 2013-05-24 HISTORY — DX: Unspecified dislocation of right shoulder joint, initial encounter: S43.004A

## 2013-06-09 ENCOUNTER — Ambulatory Visit (HOSPITAL_COMMUNITY): Admission: RE | Admit: 2013-06-09 | Payer: BC Managed Care – PPO | Source: Ambulatory Visit | Admitting: Urology

## 2013-06-09 ENCOUNTER — Encounter (HOSPITAL_COMMUNITY): Admission: RE | Payer: Self-pay | Source: Ambulatory Visit

## 2013-06-09 SURGERY — HYDROCELECTOMY
Anesthesia: General | Laterality: Right

## 2013-06-17 ENCOUNTER — Other Ambulatory Visit: Payer: Self-pay | Admitting: Urology

## 2013-08-10 ENCOUNTER — Encounter (HOSPITAL_COMMUNITY): Payer: Self-pay

## 2013-08-10 ENCOUNTER — Other Ambulatory Visit (HOSPITAL_COMMUNITY): Payer: BC Managed Care – PPO

## 2013-08-10 ENCOUNTER — Encounter (HOSPITAL_COMMUNITY): Payer: Self-pay | Admitting: Pharmacy Technician

## 2013-08-10 ENCOUNTER — Encounter (HOSPITAL_COMMUNITY)
Admission: RE | Admit: 2013-08-10 | Discharge: 2013-08-10 | Disposition: A | Discharge status: Home / Self Care | Payer: BC Managed Care – PPO | Source: Ambulatory Visit | Attending: Urology | Admitting: Urology

## 2013-08-10 ENCOUNTER — Ambulatory Visit (HOSPITAL_COMMUNITY)
Admission: RE | Admit: 2013-08-10 | Discharge: 2013-08-10 | Disposition: A | Discharge status: Home / Self Care | Payer: BC Managed Care – PPO | Source: Ambulatory Visit | Attending: Urology | Admitting: Urology

## 2013-08-10 DIAGNOSIS — I77819 Aortic ectasia, unspecified site: Secondary | ICD-10-CM | POA: Insufficient documentation

## 2013-08-10 DIAGNOSIS — Z01818 Encounter for other preprocedural examination: Secondary | ICD-10-CM | POA: Insufficient documentation

## 2013-08-10 DIAGNOSIS — N433 Hydrocele, unspecified: Secondary | ICD-10-CM | POA: Insufficient documentation

## 2013-08-10 DIAGNOSIS — Z01812 Encounter for preprocedural laboratory examination: Secondary | ICD-10-CM | POA: Insufficient documentation

## 2013-08-10 HISTORY — DX: Unspecified dislocation of right shoulder joint, initial encounter: S43.004A

## 2013-08-10 LAB — CBC
MCH: 31.3 pg (ref 26.0–34.0)
MCV: 90.2 fL (ref 78.0–100.0)
Platelets: 227 10*3/uL (ref 150–400)
RBC: 4.89 MIL/uL (ref 4.22–5.81)
RDW: 12.1 % (ref 11.5–15.5)
WBC: 10.1 10*3/uL (ref 4.0–10.5)

## 2013-08-10 LAB — BASIC METABOLIC PANEL
Calcium: 9 mg/dL (ref 8.4–10.5)
Creatinine, Ser: 0.76 mg/dL (ref 0.50–1.35)
GFR calc Af Amer: >90 mL/min (ref >90)
GFR calc non Af Amer: >90 mL/min (ref >90)
Sodium: 138 mEq/L (ref 135–145)

## 2013-08-10 NOTE — Progress Notes (Signed)
08/10/13 1336  OBSTRUCTIVE SLEEP APNEA  Have you ever been diagnosed with sleep apnea through a sleep study? No  Do you snore loudly (loud enough to be heard through closed doors)?  1  Do you often feel tired, fatigued, or sleepy during the daytime? 0  Has anyone observed you stop breathing during your sleep? 1  Do you have, or are you being treated for high blood pressure? 1  BMI more than 35 kg/m2? 1  Age over 57 years old? 1  Neck circumference greater than 40 cm/18 inches? 1  Gender: 1  Obstructive Sleep Apnea Score 7  Score 4 or greater  Results sent to PCP

## 2013-08-10 NOTE — Progress Notes (Signed)
7-7-2014echo epic 01-21-2013 lov dr Clifton James cardiology epic 03-28-2013 ekg epic

## 2013-08-10 NOTE — Patient Instructions (Addendum)
20 Ernest Martinez  08/10/2013   Your procedure is scheduled on: November 24th  Report to [facility] at 830 AM.  Call this number if you have problems the morning of surgery [phone]   Remember:   Do not eat food or drink liquids :After Midnight.     Take these medicines the morning of surgery with A SIP OF WATER: amlodipine, omeprazole                                SEE [place] PREPARING FOR SURGERY SHEET             You may not have any metal on your body including hair pins and piercings  Do not wear jewelry, make-up.  Do not wear lotions, powders, or perfumes. You may wear deodorant.   Men may shave face and neck.  Do not bring valuables to the hospital.  IS NOT RESPONSIBLE FOR VALUEABLES.  Contacts, dentures or bridgework may not be worn into surgery.  Leave suitcase in the car. After surgery it may be brought to your room.  For patients admitted to the hospital, checkout time is 11:00 AM the day of discharge.   Patients discharged the day of surgery will not be allowed to drive home.  Name and phone number of your driver: wife teresa cell [phone]  Special Instructions: N/A   Please read over the following fact sheets that you were given:   Call Cain Sieve RN pre op nurse if needed [phone]    FAILURE TO FOLLOW THESE INSTRUCTIONS MAY RESULT IN THE CANCELLATION OF YOUR SURGERY.  PATIENT SIGNATURE___________________________________________  NURSE SIGNATURE_____________________________________________

## 2013-08-13 NOTE — H&P (Signed)
Reason For Visit Patient presents today for elective hydrocele repair.  He was in recently to see Ernest Martinez and her history and physical from that visit are below:  History of Present Illness Ernest Martinez is a 57 yr old male patient of Dr. Ellin Goodie w/ the following GU hx:    1) Right hydrocele: He has an upcoming cysto hydrocelectomy OR procedure.  2) UTI: He was admitted in July 2014 for urosepsis and also had hematuria. He had a normal hematuria workup w/ CT and cystoscopy.    Interval hx:  Ernest Martinez has a preop apt for med clearance for upcoming OR hydrocelectomy today but was to come the the urology office for assessment of UTI by triage. He contacted our triage line this morning w/ c/o 2-week hx of lower back pain and foul odor urine. He had similar symptoms in July and was found to have urosepsis at that time so therefore is concerned of recurrent infection. He admits that urine odor has improved today after he had restarted on his Lasix because he is voiding urine more frequently w/ Lasix. Denies fever/chills/nausea/vomiting. Denies hematuria/dysuria or any other associated voiding symptoms or alleviating factors. States that his lower back pain is worse w/ movement and better in supine position   Past Medical History Problems  1. History of heartburn (V12.79) 2. History of hypertension (V12.59)  Surgical History Problems  1. History of No Surgical Problems  Current Meds 1. AmLODIPine Besylate 10 MG Oral Tablet;  Therapy: (Recorded:19Aug2014) to Recorded 2. Furosemide 20 MG Oral Tablet;  Therapy: (Recorded:19Aug2014) to Recorded 3. Lisinopril 40 MG Oral Tablet;  Therapy: (Recorded:19Aug2014) to Recorded 4. Omeprazole 20 MG Oral Capsule Delayed Release;  Therapy: (Recorded:19Aug2014) to Recorded  Allergies Medication  1. No Known Drug Allergies  Family History Problems  1. Family history of Death In The Family Ernest Martinez : Ernest Martinez   74yrs, PCA 2. Family history of Family  Health Status - Ernest Martinez's Age : Ernest Martinez   17yrs 3. Family history of Family Health Status Number Of Children   1 son 4. Family history of Prostate Cancer (W09.81) : Ernest Martinez  Social History Problems  1. Being A Social Drinker   weekend 2. Caffeine Use   small amt 3. Occupation:   Armed forces training and education officer 4. Denied: History of Tobacco Use  Review of Systems Genitourinary system(s) were reviewed and pertinent findings if present are noted.  Genitourinary: foul-smelling urine, but no hematuria.  Gastrointestinal: no nausea, no vomiting, no flank pain, no abdominal pain, no diarrhea and no constipation.  Constitutional: no fever.  Integumentary: no skin wound.    Vitals Vital Signs [Data Includes: Last 1 Day]  Recorded: 18Nov2014 11:54AM  Height: 5 ft 6 in Weight: 380 lb  BMI Calculated: 61.33 BSA Calculated: 2.63 Blood Pressure: 151 / 92, Sitting Temperature: 97.6 F, Oral Heart Rate: 67 Respiration: 20  Physical Exam Constitutional: Well nourished and well developed . No acute distress.  ENT:. The ears and nose are normal in appearance.  Neck: The appearance of the neck is normal.  Pulmonary: No respiratory distress.  Abdomen: No CVA tenderness.  Genitourinary: Examination of the right scrotum demonstrates a hydrocele. Examination of the left scrotum demostrates no hydrocele.  Neuro/Psych:. Mood and affect are appropriate.    Results/Data Urine [Data Includes: Last 1 Day]   18Nov2014  COLOR STRAW   APPEARANCE CLEAR   SPECIFIC GRAVITY 1.015   pH 6.0   GLUCOSE NEG mg/dL  BILIRUBIN NEG   KETONE NEG mg/dL  BLOOD NEG  PROTEIN NEG mg/dL  UROBILINOGEN 0.2 mg/dL  NITRITE NEG   LEUKOCYTE ESTERASE NEG    The following clinical lab reports were reviewed:  UA normal.    Assessment Assessed  1. Malodorous urine (791.9) 2. Urinary tract infection (599.0)  UA normal and urine is clear. Afebrile. Asymptomatic   Plan Health Maintenance  1. UA With REFLEX; Status:Complete;    Done: 18Nov2014 11:06AM  Discussion/Summary Assured patient that he does not have a UTI per UA and is asymptomatic. Lower back pain most likely chronic and improves w/ positional changes. Our OR scheduler, Lora Havens, was able to obtain another preop apt for patient today since his OR hydrocelectomy is on Monday and pt lives an hour away.   Signatures Electronically signed by : Seward Grater, ANP-C; Aug 10 2013 11:19PM EST

## 2013-08-15 MED ORDER — DEXTROSE 5 % IV SOLN
3.0000 g | INTRAVENOUS | Status: AC
  Administered 2013-08-16: 3 g via INTRAVENOUS
  Filled 2013-08-15: qty 3000

## 2013-08-16 ENCOUNTER — Ambulatory Visit (HOSPITAL_COMMUNITY): Payer: BC Managed Care – PPO | Admitting: Anesthesiology

## 2013-08-16 ENCOUNTER — Encounter (HOSPITAL_COMMUNITY): Payer: Self-pay

## 2013-08-16 ENCOUNTER — Encounter (HOSPITAL_COMMUNITY): Payer: BC Managed Care – PPO | Admitting: Anesthesiology

## 2013-08-16 ENCOUNTER — Encounter (HOSPITAL_COMMUNITY)
Admission: RE | Disposition: A | Discharge status: Home / Self Care | Payer: Self-pay | Source: Ambulatory Visit | Attending: Urology

## 2013-08-16 ENCOUNTER — Ambulatory Visit (HOSPITAL_COMMUNITY)
Admission: RE | Admit: 2013-08-16 | Discharge: 2013-08-16 | Disposition: A | Discharge status: Home / Self Care | Payer: BC Managed Care – PPO | Source: Ambulatory Visit | Attending: Urology | Admitting: Urology

## 2013-08-16 DIAGNOSIS — I10 Essential (primary) hypertension: Secondary | ICD-10-CM | POA: Insufficient documentation

## 2013-08-16 DIAGNOSIS — M545 Low back pain, unspecified: Secondary | ICD-10-CM | POA: Insufficient documentation

## 2013-08-16 DIAGNOSIS — Z79899 Other long term (current) drug therapy: Secondary | ICD-10-CM | POA: Insufficient documentation

## 2013-08-16 DIAGNOSIS — N433 Hydrocele, unspecified: Secondary | ICD-10-CM | POA: Insufficient documentation

## 2013-08-16 HISTORY — PX: HYDROCELE EXCISION: SHX482

## 2013-08-16 SURGERY — HYDROCELECTOMY
Anesthesia: General | Laterality: Right | Wound class: Clean

## 2013-08-16 MED ORDER — MIDAZOLAM HCL 2 MG/2ML IJ SOLN
INTRAMUSCULAR | Status: AC
  Filled 2013-08-16: qty 2

## 2013-08-16 MED ORDER — BUPIVACAINE HCL (PF) 0.25 % IJ SOLN
INTRAMUSCULAR | Status: DC | PRN
  Administered 2013-08-16: 9 mL

## 2013-08-16 MED ORDER — FENTANYL CITRATE 0.05 MG/ML IJ SOLN
INTRAMUSCULAR | Status: DC | PRN
  Administered 2013-08-16: 100 ug via INTRAVENOUS

## 2013-08-16 MED ORDER — ROCURONIUM BROMIDE 100 MG/10ML IV SOLN
INTRAVENOUS | Status: DC | PRN
  Administered 2013-08-16: 10 mg via INTRAVENOUS
  Administered 2013-08-16: 30 mg via INTRAVENOUS

## 2013-08-16 MED ORDER — FENTANYL CITRATE 0.05 MG/ML IJ SOLN: 25.0000 ug | INTRAMUSCULAR | Status: DC | PRN

## 2013-08-16 MED ORDER — PROPOFOL 10 MG/ML IV BOLUS
INTRAVENOUS | Status: DC | PRN
  Administered 2013-08-16: 100 mg via INTRAVENOUS
  Administered 2013-08-16: 150 mg via INTRAVENOUS

## 2013-08-16 MED ORDER — LIDOCAINE HCL (CARDIAC) 20 MG/ML IV SOLN
INTRAVENOUS | Status: DC | PRN
  Administered 2013-08-16: 100 mg via INTRAVENOUS

## 2013-08-16 MED ORDER — GLYCOPYRROLATE 0.2 MG/ML IJ SOLN
INTRAMUSCULAR | Status: AC
  Filled 2013-08-16: qty 3

## 2013-08-16 MED ORDER — LACTATED RINGERS IV SOLN: INTRAVENOUS | Status: DC

## 2013-08-16 MED ORDER — BUPIVACAINE HCL (PF) 0.25 % IJ SOLN
INTRAMUSCULAR | Status: AC
  Filled 2013-08-16: qty 30

## 2013-08-16 MED ORDER — LACTATED RINGERS IV SOLN
INTRAVENOUS | Status: DC
  Administered 2013-08-16: 10:00:00 via INTRAVENOUS

## 2013-08-16 MED ORDER — MIDAZOLAM HCL 5 MG/5ML IJ SOLN
INTRAMUSCULAR | Status: DC | PRN
  Administered 2013-08-16: 2 mg via INTRAVENOUS

## 2013-08-16 MED ORDER — SUCCINYLCHOLINE CHLORIDE 20 MG/ML IJ SOLN
INTRAMUSCULAR | Status: AC
  Filled 2013-08-16: qty 1

## 2013-08-16 MED ORDER — ONDANSETRON HCL 4 MG/2ML IJ SOLN
INTRAMUSCULAR | Status: DC | PRN
  Administered 2013-08-16: 4 mg via INTRAVENOUS

## 2013-08-16 MED ORDER — GLYCOPYRROLATE 0.2 MG/ML IJ SOLN
INTRAMUSCULAR | Status: DC | PRN
  Administered 2013-08-16: .6 mg via INTRAVENOUS

## 2013-08-16 MED ORDER — HYDROCODONE-ACETAMINOPHEN 5-325 MG PO TABS: 1.0000 | ORAL_TABLET | Freq: Four times a day (QID) | ORAL | Status: DC | PRN

## 2013-08-16 MED ORDER — SUCCINYLCHOLINE CHLORIDE 20 MG/ML IJ SOLN
INTRAMUSCULAR | Status: DC | PRN
  Administered 2013-08-16: 100 mg via INTRAVENOUS

## 2013-08-16 MED ORDER — PROPOFOL 10 MG/ML IV BOLUS
INTRAVENOUS | Status: AC
  Filled 2013-08-16: qty 20

## 2013-08-16 MED ORDER — NEOSTIGMINE METHYLSULFATE 1 MG/ML IJ SOLN
INTRAMUSCULAR | Status: AC
  Filled 2013-08-16: qty 10

## 2013-08-16 MED ORDER — FENTANYL CITRATE 0.05 MG/ML IJ SOLN
INTRAMUSCULAR | Status: AC
  Filled 2013-08-16: qty 5

## 2013-08-16 MED ORDER — ROCURONIUM BROMIDE 100 MG/10ML IV SOLN
INTRAVENOUS | Status: AC
  Filled 2013-08-16: qty 1

## 2013-08-16 MED ORDER — NEOSTIGMINE METHYLSULFATE 1 MG/ML IJ SOLN
INTRAMUSCULAR | Status: DC | PRN
  Administered 2013-08-16: 4 mg via INTRAVENOUS

## 2013-08-16 MED ORDER — 0.9 % SODIUM CHLORIDE (POUR BTL) OPTIME
TOPICAL | Status: DC | PRN
  Administered 2013-08-16: 1000 mL

## 2013-08-16 MED ORDER — LIDOCAINE HCL (CARDIAC) 20 MG/ML IV SOLN
INTRAVENOUS | Status: AC
  Filled 2013-08-16: qty 5

## 2013-08-16 SURGICAL SUPPLY — 25 items
BANDAGE GAUZE ELAST BULKY 4 IN (GAUZE/BANDAGES/DRESSINGS) ×2 IMPLANT
BENZOIN TINCTURE PRP APPL 2/3 (GAUZE/BANDAGES/DRESSINGS) ×2 IMPLANT
BLADE HEX COATED 2.75 (ELECTRODE) ×2 IMPLANT
COVER SURGICAL LIGHT HANDLE (MISCELLANEOUS) ×2 IMPLANT
DRSG TEGADERM 2-3/8X2-3/4 SM (GAUZE/BANDAGES/DRESSINGS) ×2 IMPLANT
DRSG TEGADERM 4X4.75 (GAUZE/BANDAGES/DRESSINGS) ×2 IMPLANT
ELECT NEEDLE TIP 2.8 STRL (NEEDLE) ×2 IMPLANT
ELECT REM PT RETURN 9FT ADLT (ELECTROSURGICAL) ×2
ELECTRODE REM PT RTRN 9FT ADLT (ELECTROSURGICAL) ×1 IMPLANT
GLOVE BIOGEL M STRL SZ7.5 (GLOVE) ×2 IMPLANT
GOWN STRL NON-REIN LRG LVL3 (GOWN DISPOSABLE) ×2 IMPLANT
GOWN STRL REIN XL XLG (GOWN DISPOSABLE) ×2 IMPLANT
KIT BASIN OR (CUSTOM PROCEDURE TRAY) ×2 IMPLANT
NEEDLE HYPO 22GX1.5 SAFETY (NEEDLE) ×2 IMPLANT
NS IRRIG 1000ML POUR BTL (IV SOLUTION) IMPLANT
PACK GENERAL/GYN (CUSTOM PROCEDURE TRAY) ×2 IMPLANT
SUPPORT SCROTAL LG STRP (MISCELLANEOUS) IMPLANT
SUT VIC AB 2-0 SH 27 (SUTURE)
SUT VIC AB 2-0 SH 27X BRD (SUTURE) IMPLANT
SUT VIC AB 3-0 SH 27 (SUTURE) ×2
SUT VIC AB 3-0 SH 27XBRD (SUTURE) ×2 IMPLANT
SUT VIC AB 4-0 SH 27 (SUTURE)
SUT VIC AB 4-0 SH 27XBRD (SUTURE) IMPLANT
SYR CONTROL 10ML LL (SYRINGE) ×2 IMPLANT
WATER STERILE IRR 1500ML POUR (IV SOLUTION) IMPLANT

## 2013-08-16 NOTE — Transfer of Care (Signed)
Immediate Anesthesia Transfer of Care Note  Patient: Ernest Martinez  Procedure(s) Performed: Procedure(s): HYDROCELECTOMY ADULT  Procedure: Right Scrotal Hydrocele Repair (Right)  Patient Location: PACU  Anesthesia Type:General  Level of Consciousness: awake, alert  and oriented  Airway & Oxygen Therapy: Patient Spontanous Breathing and Patient connected to face mask oxygen  Post-op Assessment: Report given to PACU RN and Post -op Vital signs reviewed and stable  Post vital signs: Reviewed and stable  Complications: No apparent anesthesia complications

## 2013-08-16 NOTE — Op Note (Signed)
Preoperative diagnosis: Right testicular hydrocele  Postoperative diagnosis: Same  Procedure: Hydrocele repair  Surgeon: Valetta Fuller M.D.  Anesthesia: Gen.  Indications: Patient presented with scrotal swelling. A hydrocele was confirmed with scrotal ultrasonography. The patient was symptomatic from his hydrocele and requested surgical intervention. He appeared to understand the risks benefits potential complications of this procedure. The patient has received perioperative antibiotics and placement of PAS compression boots.  Technique and findings: Patient was brought to the operating room where he had successful induction of general anesthesia. The scrotum was then prepped and draped in the usual manner. Appropriate surgical timeout was performed. An incision was made in the median raphae of the scrotum. A tense hydrocele was encountered which was freed from the scrotal wall and then the testis and hydrocele sac were delivered from the incision. The hydrocele sac was then incised and a large amount of clear yellow fluid was obtained. The testis was carefully inspected and no other pathology was appreciated. The hydrocele sac was moderate in thickness. Some of the redundant sac was excised with electrocautery. The sac was then plicated with some 3-0 Vicryl suture. The spermatic cord block was then performed with quarter percent Marcaine. The testis was returned to the hemiscrotum taking great care to make sure that there was no twisting of the spermatic cord. The scrotal compartment was then copiously irrigated. The scrotal incision was then closed with multiple layers of Vicryl suture. A Tegaderm dressing was applied. Sponge and needle counts were correct. No obvious complications occurred and the patient was brought to recovery room in stable condition.

## 2013-08-16 NOTE — Anesthesia Postprocedure Evaluation (Signed)
  Anesthesia Post-op Note  Patient: Ernest Martinez  Procedure(s) Performed: Procedure(s) (LRB): HYDROCELECTOMY ADULT  Procedure: Right Scrotal Hydrocele Repair (Right)  Patient Location: PACU  Anesthesia Type: General  Level of Consciousness: awake and alert   Airway and Oxygen Therapy: Patient Spontanous Breathing  Post-op Pain: mild  Post-op Assessment: Post-op Vital signs reviewed, Patient's Cardiovascular Status Stable, Respiratory Function Stable, Patent Airway and No signs of Nausea or vomiting  Last Vitals:  Filed Vitals:   08/16/13 1212  BP: 122/71  Pulse: 61  Temp: 36.8 C  Resp: 15    Post-op Vital Signs: stable   Complications: No apparent anesthesia complications

## 2013-08-16 NOTE — Interval H&P Note (Signed)
History and Physical Interval Note:  08/16/2013 10:09 AM  Ernest Martinez  has presented today for surgery, with the diagnosis of RIGHT HYDROCELE  The various methods of treatment have been discussed with the patient and family. After consideration of risks, benefits and other options for treatment, the patient has consented to  Procedure(s): HYDROCELECTOMY ADULT  Procedure: Right Scrotal Hydrocele Repair (Right) as a surgical intervention .  The patient's history has been reviewed, patient examined, no change in status, stable for surgery.  I have reviewed the patient's chart and labs.  Questions were answered to the patient's satisfaction.     Bhavana Kady S

## 2013-08-16 NOTE — Anesthesia Preprocedure Evaluation (Addendum)
Anesthesia Evaluation  Patient identified by MRN, date of birth, ID band Patient awake    Reviewed: Allergy & Precautions, H&P , NPO status , Patient's Chart, lab work & pertinent test results  Airway Mallampati: III TM Distance: >3 FB Neck ROM: full    Dental  (+) Edentulous Upper, Missing and Dental Advisory Given All front lower teeth missing too:   Pulmonary neg pulmonary ROS,  breath sounds clear to auscultation  Pulmonary exam normal       Cardiovascular hypertension, Pt. on medications Rhythm:regular Rate:Normal  IVCD   Neuro/Psych negative neurological ROS  negative psych ROS   GI/Hepatic negative GI ROS, Neg liver ROS, GERD-  Medicated and Controlled,  Endo/Other  diabetesMorbid obesityBorderline DM  Renal/GU negative Renal ROS  negative genitourinary   Musculoskeletal   Abdominal (+) + obese,   Peds  Hematology negative hematology ROS (+)   Anesthesia Other Findings   Reproductive/Obstetrics negative OB ROS                          Anesthesia Physical Anesthesia Plan  ASA: III  Anesthesia Plan: General   Post-op Pain Management:    Induction: Intravenous  Airway Management Planned: Oral ETT  Additional Equipment:   Intra-op Plan:   Post-operative Plan: Extubation in OR  Informed Consent: I have reviewed the patients History and Physical, chart, labs and discussed the procedure including the risks, benefits and alternatives for the proposed anesthesia with the patient or authorized representative who has indicated his/her understanding and acceptance.   Dental Advisory Given  Plan Discussed with: CRNA and Surgeon  Anesthesia Plan Comments:        Anesthesia Quick Evaluation

## 2013-08-17 ENCOUNTER — Encounter (HOSPITAL_COMMUNITY): Payer: Self-pay | Admitting: Urology

## 2013-09-03 ENCOUNTER — Other Ambulatory Visit: Payer: Self-pay

## 2013-09-03 MED ORDER — AMLODIPINE BESYLATE 10 MG PO TABS: 10.0000 mg | ORAL_TABLET | Freq: Every morning | ORAL | Status: DC

## 2013-09-03 MED ORDER — FUROSEMIDE 20 MG PO TABS: 20.0000 mg | ORAL_TABLET | Freq: Every morning | ORAL | Status: DC

## 2013-09-03 MED ORDER — LISINOPRIL 40 MG PO TABS: 40.0000 mg | ORAL_TABLET | Freq: Every day | ORAL | Status: DC

## 2013-09-03 MED ORDER — OMEPRAZOLE 20 MG PO CPDR: 20.0000 mg | DELAYED_RELEASE_CAPSULE | Freq: Every day | ORAL | Status: DC

## 2013-09-07 ENCOUNTER — Telehealth: Payer: Self-pay | Admitting: Family Medicine

## 2013-09-07 NOTE — Telephone Encounter (Signed)
appt made

## 2013-09-08 ENCOUNTER — Ambulatory Visit (INDEPENDENT_AMBULATORY_CARE_PROVIDER_SITE_OTHER): Payer: BC Managed Care – PPO | Admitting: Nurse Practitioner

## 2013-09-08 ENCOUNTER — Encounter: Payer: Self-pay | Admitting: Nurse Practitioner

## 2013-09-08 VITALS — BP 174/105 | HR 84 | Temp 97.3°F | Ht 66.0 in | Wt 380.0 lb

## 2013-09-08 DIAGNOSIS — L723 Sebaceous cyst: Secondary | ICD-10-CM

## 2013-09-08 MED ORDER — SULFAMETHOXAZOLE-TMP DS 800-160 MG PO TABS: 1.0000 | ORAL_TABLET | Freq: Two times a day (BID) | ORAL | Status: DC

## 2013-09-08 NOTE — Progress Notes (Signed)
   Subjective:    Patient ID: Ernest Martinez, male    DOB: Oct 08, 1955, 57 y.o.   MRN: [id number]  HPI Sore place behind left ear- has been there for awhile but just started getting bigger ans sore 2 weeks ago. Denies drainage.    Review of Systems  Constitutional: Negative.   HENT: Negative.   Eyes: Negative.   Respiratory: Negative.   Cardiovascular: Negative.   All other systems reviewed and are negative.       Objective:   Physical Exam  Constitutional: He appears well-developed and well-nourished.  Cardiovascular: Normal rate, regular rhythm and normal heart sounds.   Pulmonary/Chest: Effort normal and breath sounds normal.  Skin:  sebaceous cyst posterior to left auricle   Procedure; Lido 1% with epi- 2ml local Cleaned with betadine #11 blade Small amount of cystic material removed Dressing apoplied- wpound left open to drain       Assessment & Plan:   1. Sebaceous cyst of ear    Meds ordered this encounter  Medications  . sulfamethoxazole-trimethoprim (BACTRIM DS) 800-160 MG per tablet    Sig: Take 1 tablet by mouth 2 (two) times daily.    Dispense:  20 tablet    Refill:  0    Order Specific Question:  Supervising Provider    Answer:  Ernestina Penna [[id number]]   Wound care instructions given to patient RTO prn and if not healing  Mary-Margaret Daphine Deutscher, FNP

## 2013-09-08 NOTE — Patient Instructions (Signed)
Wound Care Wound care helps prevent pain and infection.  You may need a tetanus shot if:  You cannot remember when you had your last tetanus shot.  You have never had a tetanus shot.  The injury broke your skin. If you need a tetanus shot and you choose not to have one, you may get tetanus. Sickness from tetanus can be serious. HOME CARE   Only take medicine as told by your doctor.  Clean the wound daily with mild soap and water.  Change any bandages (dressings) as told by your doctor.  Put medicated cream and a bandage on the wound as told by your doctor.  Change the bandage if it gets wet, dirty, or starts to smell.  Take showers. Do not take baths, swim, or do anything that puts your wound under water.  Rest and raise (elevate) the wound until the pain and puffiness (swelling) are better.  Keep all doctor visits as told. GET HELP RIGHT AWAY IF:   Yellowish-white fluid (pus) comes from the wound.  Medicine does not lessen your pain.  There is a red streak going away from the wound.  You have a fever. MAKE SURE YOU:   Understand these instructions.  Will watch your condition.  Will get help right away if you are not doing well or get worse. Document Released: 06/18/2008 Document Revised: 12/02/2011 Document Reviewed: 01/13/2011 [employer] Patient Information 2014 [employer].  

## 2013-11-05 ENCOUNTER — Other Ambulatory Visit: Payer: Self-pay | Admitting: Family Medicine

## 2014-03-15 ENCOUNTER — Other Ambulatory Visit: Payer: Self-pay | Admitting: Nurse Practitioner

## 2014-04-15 ENCOUNTER — Other Ambulatory Visit: Payer: Self-pay | Admitting: Nurse Practitioner

## 2014-04-21 ENCOUNTER — Ambulatory Visit (INDEPENDENT_AMBULATORY_CARE_PROVIDER_SITE_OTHER): Payer: BC Managed Care – PPO | Admitting: Physician Assistant

## 2014-04-21 ENCOUNTER — Encounter: Payer: Self-pay | Admitting: Physician Assistant

## 2014-04-21 VITALS — BP 171/98 | HR 81 | Temp 98.3°F | Ht 65.0 in | Wt 393.0 lb

## 2014-04-21 DIAGNOSIS — R7309 Other abnormal glucose: Secondary | ICD-10-CM

## 2014-04-21 DIAGNOSIS — I10 Essential (primary) hypertension: Secondary | ICD-10-CM

## 2014-04-21 NOTE — Patient Instructions (Signed)

## 2014-04-21 NOTE — Progress Notes (Signed)
Subjective:     Patient ID: Ernest Martinez, male   DOB: Aug 01, 1956, 58 y.o.   MRN: [id number]  HPI Pt here for recheck of HTN He has been out of meds for ~ 1wk Needs rf of meds  Review of Systems  Constitutional: Negative.   Respiratory: Negative.   Cardiovascular: Negative.   Gastrointestinal: Negative.        Objective:   Physical Exam  Nursing note and vitals reviewed. Constitutional: He appears well-developed and well-nourished.  HENT:  Mouth/Throat: Oropharynx is clear and moist.  Neck: Neck supple. No JVD present. No thyromegaly present.  Cardiovascular: Normal rate, regular rhythm, normal heart sounds and intact distal pulses.   Pulmonary/Chest: Effort normal and breath sounds normal. He has no wheezes.  Musculoskeletal: He exhibits edema.  Trace lower ext edema  Lymphadenopathy:    He has no cervical adenopathy.       Assessment:     HTN Lower ext edema    Plan:     Stressed importance of staying on his meds Discussed weight issues today Pt w/o ins so minimum labs done today Will inform of lab results F/U in 6 months regarding BP Pt had full cardiac work up x 2 last yr that was nl

## 2014-04-22 LAB — BMP8+EGFR
BUN / CREAT RATIO: 12 (ref 9–20)
BUN: 9 mg/dL (ref 6–24)
CHLORIDE: 100 mmol/L (ref 97–108)
CO2: 29 mmol/L (ref 18–29)
Calcium: 9.5 mg/dL (ref 8.7–10.2)
Creatinine, Ser: 0.75 mg/dL — ABNORMAL LOW (ref 0.76–1.27)
GFR calc non Af Amer: 102 mL/min/{1.73_m2} (ref >59)
GFR, EST AFRICAN AMERICAN: 118 mL/min/{1.73_m2} (ref >59)
GLUCOSE: 127 mg/dL — AB (ref 65–99)
Potassium: 3.7 mmol/L (ref 3.5–5.2)
Sodium: 143 mmol/L (ref 134–144)

## 2014-04-23 ENCOUNTER — Other Ambulatory Visit: Payer: Self-pay | Admitting: Nurse Practitioner

## 2014-05-02 ENCOUNTER — Other Ambulatory Visit: Payer: Self-pay | Admitting: *Deleted

## 2014-05-02 DIAGNOSIS — R739 Hyperglycemia, unspecified: Secondary | ICD-10-CM

## 2014-05-10 LAB — POCT GLYCOSYLATED HEMOGLOBIN (HGB A1C): HEMOGLOBIN A1C: 6.7

## 2014-05-10 NOTE — Addendum Note (Signed)
Addended by: Earlene Plater on: 05/10/2014 09:27 AM   Modules accepted: Orders

## 2014-07-26 ENCOUNTER — Encounter (INDEPENDENT_AMBULATORY_CARE_PROVIDER_SITE_OTHER): Payer: Self-pay

## 2014-07-26 ENCOUNTER — Encounter: Payer: Self-pay | Admitting: Family Medicine

## 2014-07-26 ENCOUNTER — Ambulatory Visit (INDEPENDENT_AMBULATORY_CARE_PROVIDER_SITE_OTHER): Payer: Self-pay | Admitting: Family Medicine

## 2014-07-26 VITALS — Temp 97.8°F | Ht 65.0 in | Wt 395.6 lb

## 2014-07-26 DIAGNOSIS — Z024 Encounter for examination for driving license: Secondary | ICD-10-CM

## 2014-07-26 DIAGNOSIS — Z139 Encounter for screening, unspecified: Secondary | ICD-10-CM

## 2014-07-26 LAB — POCT URINALYSIS DIPSTICK
Bilirubin, UA: NEGATIVE
Blood, UA: NEGATIVE
Glucose, UA: NEGATIVE
Ketones, UA: NEGATIVE
Leukocytes, UA: NEGATIVE
Nitrite, UA: NEGATIVE
Protein, UA: NEGATIVE
Spec Grav, UA: 1.01
Urobilinogen, UA: NEGATIVE
pH, UA: 6.5

## 2014-07-26 NOTE — Progress Notes (Signed)
   Subjective:    Patient ID: Ernest Martinez, male    DOB: January 12, 1956, 58 y.o.   MRN: [id number]  HPI Patient is here for DOT PE. He has hx of HTN.  He is taking meds for HTN  Review of Systems  Constitutional: Negative for fever.  HENT: Negative for ear pain.   Eyes: Negative for discharge.  Respiratory: Negative for cough.   Cardiovascular: Negative for chest pain.  Gastrointestinal: Negative for abdominal distention.  Endocrine: Negative for polyuria.  Genitourinary: Negative for difficulty urinating.  Musculoskeletal: Negative for gait problem and neck pain.  Skin: Negative for color change and rash.  Neurological: Negative for speech difficulty and headaches.  Psychiatric/Behavioral: Negative for agitation.       Objective:    Temp(Src) 97.8 F (36.6 C) (Oral)  Ht 5\' 5"  (1.651 m)  Wt 395 lb 9.6 oz (179.443 kg)  BMI 65.83 kg/m2   Physical Exam  Constitutional: He is oriented to person, place, and time. He appears well-developed and well-nourished.  HENT:  Head: Normocephalic and atraumatic.  Mouth/Throat: Oropharynx is clear and moist.  Eyes: Pupils are equal, round, and reactive to light.  Neck: Normal range of motion. Neck supple.  Cardiovascular: Normal rate and regular rhythm.   No murmur heard. Pulmonary/Chest: Effort normal and breath sounds normal.  Abdominal: Soft. Bowel sounds are normal. There is no tenderness.  Neurological: He is alert and oriented to person, place, and time.  Skin: Skin is warm and dry.  Psychiatric: He has a normal mood and affect.          Assessment & Plan:     ICD-9-CM ICD-10-CM   1. Screening V82.9 Z13.9 POCT urinalysis dipstick     No Follow-up on file.  Lysbeth Penner FNP

## 2014-09-08 ENCOUNTER — Encounter: Payer: Self-pay | Admitting: Family Medicine

## 2014-09-08 ENCOUNTER — Ambulatory Visit: Payer: Self-pay | Admitting: Family Medicine

## 2014-09-08 VITALS — BP 169/100 | HR 88 | Temp 97.5°F | Ht 65.0 in | Wt >= 6400 oz

## 2014-09-08 DIAGNOSIS — J209 Acute bronchitis, unspecified: Secondary | ICD-10-CM

## 2014-09-08 MED ORDER — HYDROCODONE-HOMATROPINE 5-1.5 MG/5ML PO SYRP
5.0000 mL | ORAL_SOLUTION | Freq: Three times a day (TID) | ORAL | Status: DC | PRN
Start: 2014-09-08 — End: 2014-11-15

## 2014-09-08 MED ORDER — DOXYCYCLINE HYCLATE 100 MG PO TABS: 100.0000 mg | ORAL_TABLET | Freq: Two times a day (BID) | ORAL | Status: DC

## 2014-09-08 NOTE — Progress Notes (Signed)
   Subjective:    Patient ID: Ernest Martinez, male    DOB: 09-09-1956, 58 y.o.   MRN: [id number]  HPI 58 year old gentleman with cough and wheezing for the past several days. He does not have a history of asthma or COPD. He denies tobacco use. Cough is mostly dry and nonproductive. He denies head congestion or sinus drainage. He denies fever or chills.    Review of Systems  Constitutional: Negative.   HENT: Negative.   Eyes: Negative.   Respiratory: Positive for cough and wheezing. Negative for shortness of breath.   Cardiovascular: Negative.  Negative for chest pain and leg swelling.  Gastrointestinal: Negative.   Genitourinary: Negative.   Musculoskeletal: Negative.   Skin: Negative.   Neurological: Negative.   Psychiatric/Behavioral: Negative.   All other systems reviewed and are negative.      Objective:   Physical Exam  Constitutional: He is oriented to person, place, and time. He appears well-developed and well-nourished.  HENT:  Head: Normocephalic.  Right Ear: External ear normal.  Left Ear: External ear normal.  Nose: Nose normal.  Mouth/Throat: Oropharynx is clear and moist.  Eyes: Conjunctivae and EOM are normal. Pupils are equal, round, and reactive to light.  Neck: Normal range of motion. Neck supple.  Cardiovascular: Normal rate, regular rhythm, normal heart sounds and intact distal pulses.   Pulmonary/Chest: Effort normal and breath sounds normal.  Abdominal: Soft. Bowel sounds are normal.  Musculoskeletal: Normal range of motion.  Neurological: He is alert and oriented to person, place, and time.  Skin: Skin is warm and dry.  Psychiatric: He has a normal mood and affect. His behavior is normal. Judgment and thought content normal.   BP 169/100 mmHg  Pulse 88  Temp(Src) 97.5 F (36.4 C) (Oral)  Ht 5\' 5"  (1.651 m)  Wt 403 lb 6.4 oz (182.981 kg)  BMI 67.13 kg/m2       Assessment & Plan:  1. Acute bronchitis, unspecified organism Rx: Doxycycline  100 mg BID x 1 week and hycodan, HS  Wardell Honour MD

## 2014-10-25 ENCOUNTER — Emergency Department (HOSPITAL_COMMUNITY): Payer: 59

## 2014-10-25 ENCOUNTER — Emergency Department (HOSPITAL_COMMUNITY)
Admission: EM | Admit: 2014-10-25 | Discharge: 2014-10-25 | Disposition: A | Discharge status: Home / Self Care | Payer: 59 | Attending: Emergency Medicine | Admitting: Emergency Medicine

## 2014-10-25 ENCOUNTER — Encounter (HOSPITAL_COMMUNITY): Payer: Self-pay | Admitting: Physical Medicine and Rehabilitation

## 2014-10-25 DIAGNOSIS — K219 Gastro-esophageal reflux disease without esophagitis: Secondary | ICD-10-CM | POA: Insufficient documentation

## 2014-10-25 DIAGNOSIS — Z87438 Personal history of other diseases of male genital organs: Secondary | ICD-10-CM | POA: Insufficient documentation

## 2014-10-25 DIAGNOSIS — I10 Essential (primary) hypertension: Secondary | ICD-10-CM | POA: Diagnosis not present

## 2014-10-25 DIAGNOSIS — Z792 Long term (current) use of antibiotics: Secondary | ICD-10-CM | POA: Diagnosis not present

## 2014-10-25 DIAGNOSIS — Z8619 Personal history of other infectious and parasitic diseases: Secondary | ICD-10-CM | POA: Insufficient documentation

## 2014-10-25 DIAGNOSIS — Z79899 Other long term (current) drug therapy: Secondary | ICD-10-CM | POA: Insufficient documentation

## 2014-10-25 DIAGNOSIS — M25562 Pain in left knee: Secondary | ICD-10-CM | POA: Diagnosis not present

## 2014-10-25 DIAGNOSIS — M79672 Pain in left foot: Secondary | ICD-10-CM | POA: Diagnosis present

## 2014-10-25 DIAGNOSIS — M25552 Pain in left hip: Secondary | ICD-10-CM | POA: Insufficient documentation

## 2014-10-25 DIAGNOSIS — M25559 Pain in unspecified hip: Secondary | ICD-10-CM

## 2014-10-25 MED ORDER — HYDROCODONE-ACETAMINOPHEN 5-325 MG PO TABS: 2.0000 | ORAL_TABLET | ORAL | Status: DC | PRN

## 2014-10-25 MED ORDER — MELOXICAM 15 MG PO TABS: 15.0000 mg | ORAL_TABLET | Freq: Every day | ORAL | Status: DC

## 2014-10-25 MED ORDER — HYDROCODONE-ACETAMINOPHEN 5-325 MG PO TABS
2.0000 | ORAL_TABLET | Freq: Once | ORAL | Status: AC
  Administered 2014-10-25: 2 via ORAL
  Filled 2014-10-25: qty 2

## 2014-10-25 NOTE — ED Notes (Signed)
Pt presents to department for evaluation of L hip pain radiating to leg and foot.. Ongoing x1 week. 8/10 pain upon arrival to ED. Denies recent injury. Pt is alert and oriented x4.

## 2014-10-25 NOTE — Discharge Instructions (Signed)
Take Mobic as needed for inflammation. Take Vicodin as needed for pain. You may take these medications together. Refer to attached documents for more information. Follow up with your doctor as needed.

## 2014-10-25 NOTE — ED Provider Notes (Signed)
CSN: [id number]     Arrival date & time 10/25/14  1051 History   First MD Initiated Contact with Patient 10/25/14 1141     Chief Complaint  Patient presents with  . Hip Pain  . Foot Pain  . Knee Pain     (Consider location/radiation/quality/duration/timing/severity/associated sxs/prior Treatment) HPI Comments: Patient is 59 year old male with a past medical history of morbid obesity, hypertension, GERD, and borderline diabetes who presents with left foot pain that started 1 week ago. The pain is aching and severe with occasional radiation up his left leg. No injury. Weight bearing activity makes the pain worse. No alleviating factors. Patient tried OTC medication without relief. No associated symptoms.    Past Medical History  Diagnosis Date  . Hypertension   . Morbid obesity   . Abnormal EKG   . GERD (gastroesophageal reflux disease)   . Borderline diabetes   . Bladder filling defect     a. 03/2013: Filling defect at base of bladder by CT pelvis, for f/u urology.  . Bacteremia due to Gram-negative bacteria     a. 03/2013: klebsiella UTI with bacteremia.  . Hydrocele of testis     a. 03/2013: US showing Large right testicular hydrocele and small left hydrocele.  . Dislocation of right shoulder joint sept 2014    after fall   Past Surgical History  Procedure Laterality Date  . None    . Hydrocele excision Right 08/16/2013    Procedure: HYDROCELECTOMY ADULT  Procedure: Right Scrotal Hydrocele Repair;  Surgeon: Bernestine Amass, MD;  Location: [facility];  Service: Urology;  Laterality: Right;   Family History  Problem Relation Age of Onset  . Cancer Father     prostate cancer  . CAD Neg Hx    History  Substance Use Topics  . Smoking status: Never Smoker   . Smokeless tobacco: Never Used  . Alcohol Use: Yes     Comment: socially    Review of Systems  Constitutional: Negative for fever, chills and fatigue.  HENT: Negative for trouble swallowing.   Eyes: Negative for visual  disturbance.  Respiratory: Negative for shortness of breath.   Cardiovascular: Negative for chest pain and palpitations.  Gastrointestinal: Negative for nausea, vomiting, abdominal pain and diarrhea.  Genitourinary: Negative for dysuria and difficulty urinating.  Musculoskeletal: Positive for arthralgias. Negative for neck pain.  Skin: Negative for color change.  Neurological: Negative for dizziness and weakness.  Psychiatric/Behavioral: Negative for dysphoric mood.      Allergies  Metoprolol tartrate  Home Medications   Prior to Admission medications   Medication Sig Start Date End Date Taking? Authorizing Provider  amLODipine (NORVASC) 10 MG tablet TAKE  ONE TABLET BY MOUTH EVERY MORNING    Chipper Herb, MD  doxycycline (VIBRA-TABS) 100 MG tablet Take 1 tablet (100 mg total) by mouth 2 (two) times daily. 09/08/14   Wardell Honour, MD  furosemide (LASIX) 20 MG tablet TAKE  ONE TABLET BY MOUTH EVERY MORNING    Chipper Herb, MD  HYDROcodone-homatropine [facility]) 5-1.5 MG/5ML syrup Take 5 mLs by mouth every 8 (eight) hours as needed for cough. 09/08/14   Wardell Honour, MD  ibuprofen (ADVIL) 200 MG tablet Take 600 mg by mouth daily as needed.    Historical Provider, MD  lisinopril (PRINIVIL,ZESTRIL) 40 MG tablet TAKE ONE TABLET BY MOUTH ONE TIME DAILY    Chipper Herb, MD  omeprazole (PRILOSEC) 20 MG capsule TAKE ONE CAPSULE BY MOUTH ONE  TIME DAILY    Chipper Herb, MD   BP 146/97 mmHg  Pulse 86  Temp(Src) 98 F (36.7 C) (Oral)  Resp 20  SpO2 96% Physical Exam  Constitutional: He is oriented to person, place, and time. He appears well-developed and well-nourished. No distress.  HENT:  Head: Normocephalic and atraumatic.  Eyes: Conjunctivae are normal.  Neck: Normal range of motion.  Cardiovascular: Normal rate and regular rhythm.  Exam reveals no gallop and no friction rub.   No murmur heard. Pulmonary/Chest: Effort normal and breath sounds normal. He has no wheezes.  He has no rales. He exhibits no tenderness.  Abdominal: Soft. He exhibits no distension. There is no tenderness. There is no rebound.  Musculoskeletal: Normal range of motion.  Tenderness to palpation at the calcaneal aspect of the left foot. Full ROM of the left ankle and toes. No obvious deformity, bruising or wound noted.   Neurological: He is alert and oriented to person, place, and time. Coordination normal.  Speech is goal-oriented. Moves limbs without ataxia.   Skin: Skin is warm and dry.  Psychiatric: He has a normal mood and affect. His behavior is normal.  Nursing note and vitals reviewed.   ED Course  Procedures (including critical care time) Labs Review Labs Reviewed - No data to display  Imaging Review Dg Foot Complete Left  10/25/2014   CLINICAL DATA:  Five day history of plantar region pain  EXAM: LEFT FOOT - COMPLETE 3+ VIEW  COMPARISON:  None.  FINDINGS: Frontal, oblique, and lateral views were obtained. There is no fracture or dislocation. Joint spaces appear intact. There is a spur arising from the inferior calcaneus.  IMPRESSION: Inferior calcaneal spur. No fracture or dislocation. No appreciable arthropathy.   Electronically Signed   By: Lowella Grip M.D.   On: 10/25/2014 12:30     EKG Interpretation None      MDM   Final diagnoses:  Left foot pain    12:43 PM Xray shows heel spur. Patient likely has arthritic pain in the foot. Patient will have mobic and vicodin for pain. Patient advised to RICE.     [address] [place], PA-C 10/26/14 [place], MD 11/01/14 [phone]

## 2014-11-08 ENCOUNTER — Telehealth: Payer: Self-pay | Admitting: Family Medicine

## 2014-11-08 NOTE — Telephone Encounter (Signed)
Appointment scheduled for 2/23 with christy

## 2014-11-15 ENCOUNTER — Ambulatory Visit (INDEPENDENT_AMBULATORY_CARE_PROVIDER_SITE_OTHER): Payer: 59 | Admitting: Family

## 2014-11-15 ENCOUNTER — Encounter: Payer: Self-pay | Admitting: Family

## 2014-11-15 VITALS — BP 152/90 | HR 90 | Temp 97.8°F | Ht 65.0 in | Wt 394.8 lb

## 2014-11-15 DIAGNOSIS — I10 Essential (primary) hypertension: Secondary | ICD-10-CM

## 2014-11-15 DIAGNOSIS — M1990 Unspecified osteoarthritis, unspecified site: Secondary | ICD-10-CM

## 2014-11-15 DIAGNOSIS — M7752 Other enthesopathy of left foot: Secondary | ICD-10-CM

## 2014-11-15 DIAGNOSIS — M779 Enthesopathy, unspecified: Secondary | ICD-10-CM

## 2014-11-15 MED ORDER — MELOXICAM 15 MG PO TABS: 15.0000 mg | ORAL_TABLET | Freq: Every day | ORAL | Status: DC

## 2014-11-15 MED ORDER — HYDROCODONE-ACETAMINOPHEN 5-325 MG PO TABS: 2.0000 | ORAL_TABLET | Freq: Four times a day (QID) | ORAL | Status: DC | PRN

## 2014-11-15 MED ORDER — CARVEDILOL 12.5 MG PO TABS: 12.5000 mg | ORAL_TABLET | Freq: Two times a day (BID) | ORAL | Status: DC

## 2014-11-15 NOTE — Progress Notes (Signed)
   Subjective:    Patient ID: Ernest Martinez, male    DOB: [birth year], 59 y.o.   MRN: [id number]  HPI Pt presents to the office for hospital follow up. Pt was discharged from hospital about 2-3 weeks ago for a bone spur on his left foot. Pt was given mobic and norco for pain. Pt states it is "better", but still hurts. Pt's BP is elevated today and pt states "that is good for me".   Review of Systems  Constitutional: Negative.   HENT: Negative.   Respiratory: Negative.   Cardiovascular: Negative.   Gastrointestinal: Negative.   Endocrine: Negative.   Genitourinary: Negative.   Musculoskeletal: Negative.   Neurological: Negative.   Hematological: Negative.   Psychiatric/Behavioral: Negative.   All other systems reviewed and are negative.      Objective:   Physical Exam  Constitutional: He is oriented to person, place, and time. He appears well-developed and well-nourished. No distress.  HENT:  Head: Normocephalic.  Right Ear: External ear normal.  Left Ear: External ear normal.  Nose: Nose normal.  Mouth/Throat: Oropharynx is clear and moist.  Eyes: Pupils are equal, round, and reactive to light. Right eye exhibits no discharge. Left eye exhibits no discharge.  Neck: Normal range of motion. Neck supple. No thyromegaly present.  Cardiovascular: Normal rate, regular rhythm, normal heart sounds and intact distal pulses.   No murmur heard. Pulmonary/Chest: Effort normal and breath sounds normal. No respiratory distress. He has no wheezes.  Abdominal: Soft. Bowel sounds are normal. He exhibits no distension. There is no tenderness.  Musculoskeletal: Normal range of motion. He exhibits no edema or tenderness.  Neurological: He is alert and oriented to person, place, and time. He has normal reflexes. No cranial nerve deficit.  Skin: Skin is warm and dry. No rash noted. No erythema.  Psychiatric: He has a normal mood and affect. His behavior is normal. Judgment and thought content  normal.  Vitals reviewed.   BP 152/90 mmHg  Pulse 90  Temp(Src) 97.8 F (36.6 C) (Oral)  Ht _0  (1.651 m)  Wt 394 lb 12.8 oz (179.08 kg)  BMI 65.70 kg/m2       Assessment & Plan:  1. Essential hypertension --Daily blood pressure log given with instructions on how to fill out and told to bring to next visit -Dash diet information given -Exercise encouraged - Stress Management  -Continue current meds -RTO in 2 weeks - carvedilol (COREG) 12.5 MG tablet; Take 1 tablet (12.5 mg total) by mouth 2 (two) times daily with a meal.  Dispense: 180 tablet; Refill: 3 - CMP14+EGFR  2. Bone spur of left foot Rest Continue Mobic  - Ambulatory referral to Podiatry  3. Arthritis - Ambulatory referral to [place], FNP

## 2014-11-15 NOTE — Patient Instructions (Signed)
DASH Eating Plan DASH stands for "Dietary Approaches to Stop Hypertension." The DASH eating plan is a healthy eating plan that has been shown to reduce high blood pressure (hypertension). Additional health benefits may include reducing the risk of type 2 diabetes mellitus, heart disease, and stroke. The DASH eating plan may also help with weight loss. WHAT DO I NEED TO KNOW ABOUT THE DASH EATING PLAN? For the DASH eating plan, you will follow these general guidelines:  Choose foods with a percent daily value for sodium of less than 5% (as listed on the food label).  Use salt-free seasonings or herbs instead of table salt or sea salt.  Check with your health care provider or pharmacist before using salt substitutes.  Eat lower-sodium products, often labeled as "lower sodium" or "no salt added."  Eat fresh foods.  Eat more vegetables, fruits, and low-fat dairy products.  Choose whole grains. Look for the word "whole" as the first word in the ingredient list.  Choose fish and skinless chicken or turkey more often than red meat. Limit fish, poultry, and meat to 6 oz (170 g) each day.  Limit sweets, desserts, sugars, and sugary drinks.  Choose heart-healthy fats.  Limit cheese to 1 oz (28 g) per day.  Eat more home-cooked food and less restaurant, buffet, and fast food.  Limit fried foods.  Cook foods using methods other than frying.  Limit canned vegetables. If you do use them, rinse them well to decrease the sodium.  When eating at a restaurant, ask that your food be prepared with less salt, or no salt if possible. WHAT FOODS CAN I EAT? Seek help from a dietitian for individual calorie needs. Grains Whole grain or whole wheat bread. Brown rice. Whole grain or whole wheat pasta. Quinoa, bulgur, and whole grain cereals. Low-sodium cereals. Corn or whole wheat flour tortillas. Whole grain cornbread. Whole grain crackers. Low-sodium crackers. Vegetables Fresh or frozen vegetables  (raw, steamed, roasted, or grilled). Low-sodium or reduced-sodium tomato and vegetable juices. Low-sodium or reduced-sodium tomato sauce and paste. Low-sodium or reduced-sodium canned vegetables.  Fruits All fresh, canned (in natural juice), or frozen fruits. Meat and Other Protein Products Ground beef (85% or leaner), grass-fed beef, or beef trimmed of fat. Skinless chicken or turkey. Ground chicken or turkey. Pork trimmed of fat. All fish and seafood. Eggs. Dried beans, peas, or lentils. Unsalted nuts and seeds. Unsalted canned beans. Dairy Low-fat dairy products, such as skim or 1% milk, 2% or reduced-fat cheeses, low-fat ricotta or cottage cheese, or plain low-fat yogurt. Low-sodium or reduced-sodium cheeses. Fats and Oils Tub margarines without trans fats. Light or reduced-fat mayonnaise and salad dressings (reduced sodium). Avocado. Safflower, olive, or canola oils. Natural peanut or almond butter. Other Unsalted popcorn and pretzels. The items listed above may not be a complete list of recommended foods or beverages. Contact your dietitian for more options. WHAT FOODS ARE NOT RECOMMENDED? Grains White bread. White pasta. White rice. Refined cornbread. Bagels and croissants. Crackers that contain trans fat. Vegetables Creamed or fried vegetables. Vegetables in a cheese sauce. Regular canned vegetables. Regular canned tomato sauce and paste. Regular tomato and vegetable juices. Fruits Dried fruits. Canned fruit in light or heavy syrup. Fruit juice. Meat and Other Protein Products Fatty cuts of meat. Ribs, chicken wings, bacon, sausage, bologna, salami, chitterlings, fatback, hot dogs, bratwurst, and packaged luncheon meats. Salted nuts and seeds. Canned beans with salt. Dairy Whole or 2% milk, cream, half-and-half, and cream cheese. Whole-fat or sweetened yogurt. Full-fat   cheeses or blue cheese. Nondairy creamers and whipped toppings. Processed cheese, cheese spreads, or cheese  curds. Condiments Onion and garlic salt, seasoned salt, table salt, and sea salt. Canned and packaged gravies. Worcestershire sauce. Tartar sauce. Barbecue sauce. Teriyaki sauce. Soy sauce, including reduced sodium. Steak sauce. Fish sauce. Oyster sauce. Cocktail sauce. Horseradish. Ketchup and mustard. Meat flavorings and tenderizers. Bouillon cubes. Hot sauce. Tabasco sauce. Marinades. Taco seasonings. Relishes. Fats and Oils Butter, stick margarine, lard, shortening, ghee, and bacon fat. Coconut, palm kernel, or palm oils. Regular salad dressings. Other Pickles and olives. Salted popcorn and pretzels. The items listed above may not be a complete list of foods and beverages to avoid. Contact your dietitian for more information. WHERE CAN I FIND MORE INFORMATION? National Heart, Lung, and [employer] Institute: www.nhlbi.nih.gov/health/health-topics/topics/dash/ Document Released: 08/29/2011 Document Revised: 01/24/2014 Document Reviewed: 07/14/2013 ExitCare Patient Information 2015 [employer]. This information is not intended to replace advice given to you by your health care provider. Make sure you discuss any questions you have with your health care provider. Hypertension Hypertension, commonly called high blood pressure, is when the force of blood pumping through your arteries is too strong. Your arteries are the blood vessels that carry blood from your heart throughout your body. A blood pressure reading consists of a higher number over a lower number, such as 110/72. The higher number (systolic) is the pressure inside your arteries when your heart pumps. The lower number (diastolic) is the pressure inside your arteries when your heart relaxes. Ideally you want your blood pressure below 120/80. Hypertension forces your heart to work harder to pump blood. Your arteries may become narrow or stiff. Having hypertension puts you at risk for heart disease, stroke, and other problems.  RISK  FACTORS Some risk factors for high blood pressure are controllable. Others are not.  Risk factors you cannot control include:   Race. You may be at higher risk if you are African American.  Age. Risk increases with age.  Gender. Men are at higher risk than women before age 45 years. After age 65, women are at higher risk than men. Risk factors you can control include:  Not getting enough exercise or physical activity.  Being overweight.  Getting too much fat, sugar, calories, or salt in your diet.  Drinking too much alcohol. SIGNS AND SYMPTOMS Hypertension does not usually cause signs or symptoms. Extremely high blood pressure (hypertensive crisis) may cause headache, anxiety, shortness of breath, and nosebleed. DIAGNOSIS  To check if you have hypertension, your health care provider will measure your blood pressure while you are seated, with your arm held at the level of your heart. It should be measured at least twice using the same arm. Certain conditions can cause a difference in blood pressure between your right and left arms. A blood pressure reading that is higher than normal on one occasion does not mean that you need treatment. If one blood pressure reading is high, ask your health care provider about having it checked again. TREATMENT  Treating high blood pressure includes making lifestyle changes and possibly taking medicine. Living a healthy lifestyle can help lower high blood pressure. You may need to change some of your habits. Lifestyle changes may include:  Following the DASH diet. This diet is high in fruits, vegetables, and whole grains. It is low in salt, red meat, and added sugars.  Getting at least 2 hours of brisk physical activity every week.  Losing weight if necessary.  Not smoking.  Limiting   alcoholic beverages.  Learning ways to reduce stress. If lifestyle changes are not enough to get your blood pressure under control, your health care provider may  prescribe medicine. You may need to take more than one. Work closely with your health care provider to understand the risks and benefits. HOME CARE INSTRUCTIONS  Have your blood pressure rechecked as directed by your health care provider.   Take medicines only as directed by your health care provider. Follow the directions carefully. Blood pressure medicines must be taken as prescribed. The medicine does not work as well when you skip doses. Skipping doses also puts you at risk for problems.   Do not smoke.   Monitor your blood pressure at home as directed by your health care provider. SEEK MEDICAL CARE IF:   You think you are having a reaction to medicines taken.  You have recurrent headaches or feel dizzy.  You have swelling in your ankles.  You have trouble with your vision. SEEK IMMEDIATE MEDICAL CARE IF:  You develop a severe headache or confusion.  You have unusual weakness, numbness, or feel faint.  You have severe chest or abdominal pain.  You vomit repeatedly.  You have trouble breathing. MAKE SURE YOU:   Understand these instructions.  Will watch your condition.  Will get help right away if you are not doing well or get worse. Document Released: 09/09/2005 Document Revised: 01/24/2014 Document Reviewed: 07/02/2013 ExitCare Patient Information 2015 [employer]. This information is not intended to replace advice given to you by your health care provider. Make sure you discuss any questions you have with your health care provider.  

## 2014-11-16 ENCOUNTER — Other Ambulatory Visit: Payer: Self-pay | Admitting: Family

## 2014-11-16 LAB — CMP14+EGFR
ALBUMIN: 4.1 g/dL (ref 3.5–5.5)
ALK PHOS: 75 IU/L (ref 39–117)
ALT: 23 IU/L (ref 0–44)
AST: 19 IU/L (ref 0–40)
Albumin/Globulin Ratio: 1.5 (ref 1.1–2.5)
BUN/Creatinine Ratio: 12 (ref 9–20)
BUN: 9 mg/dL (ref 6–24)
Bilirubin Total: 0.5 mg/dL (ref 0.0–1.2)
CALCIUM: 9.3 mg/dL (ref 8.7–10.2)
CHLORIDE: 97 mmol/L (ref 97–108)
CO2: 26 mmol/L (ref 18–29)
Creatinine, Ser: 0.75 mg/dL — ABNORMAL LOW (ref 0.76–1.27)
GFR calc Af Amer: 117 mL/min/{1.73_m2} (ref >59)
GFR calc non Af Amer: 101 mL/min/{1.73_m2} (ref >59)
Globulin, Total: 2.7 g/dL (ref 1.5–4.5)
Glucose: 180 mg/dL — ABNORMAL HIGH (ref 65–99)
Potassium: 4.6 mmol/L (ref 3.5–5.2)
Sodium: 137 mmol/L (ref 134–144)
Total Protein: 6.8 g/dL (ref 6.0–8.5)

## 2014-11-16 MED ORDER — METFORMIN HCL 500 MG PO TABS: 500.0000 mg | ORAL_TABLET | Freq: Two times a day (BID) | ORAL | Status: DC

## 2014-11-21 ENCOUNTER — Telehealth: Payer: Self-pay | Admitting: Family

## 2014-11-21 NOTE — Telephone Encounter (Signed)
Patient wanted to verify that he is suppose to take all 3 BP meds and I verified in Christy's note that yes patient is to continue all medications and follow up on 3/10. He is also aware that his referrals are in the process of being scheduled.

## 2014-11-30 ENCOUNTER — Telehealth: Payer: Self-pay | Admitting: Family

## 2014-11-30 NOTE — Telephone Encounter (Signed)
Pt was seen by Alyse Low 2 weeks ago and needed 2 week follow up. Pt given appt tomorrow at 10:30 with [facility].

## 2014-12-01 ENCOUNTER — Ambulatory Visit: Payer: 59 | Admitting: Family

## 2014-12-01 ENCOUNTER — Other Ambulatory Visit: Payer: Self-pay | Admitting: Family Medicine

## 2014-12-01 ENCOUNTER — Encounter: Payer: Self-pay | Admitting: Family

## 2014-12-01 ENCOUNTER — Ambulatory Visit (INDEPENDENT_AMBULATORY_CARE_PROVIDER_SITE_OTHER): Payer: 59 | Admitting: Family

## 2014-12-01 VITALS — BP 135/81 | HR 61 | Temp 97.6°F | Ht 65.0 in | Wt 395.2 lb

## 2014-12-01 DIAGNOSIS — I10 Essential (primary) hypertension: Secondary | ICD-10-CM | POA: Diagnosis not present

## 2014-12-01 MED ORDER — HYDROCODONE-ACETAMINOPHEN 5-325 MG PO TABS: 2.0000 | ORAL_TABLET | Freq: Four times a day (QID) | ORAL | Status: DC | PRN

## 2014-12-01 NOTE — Progress Notes (Signed)
   Subjective:    Patient ID: Ernest Martinez, male    DOB: 03/31/1956, 59 y.o.   MRN: [id number]  Pt presents to the office to day to recheck BP. Pt's BP is not at goal today.  Hypertension This is a chronic problem. The current episode started more than 1 year ago. The problem has been waxing and waning since onset. The problem is uncontrolled. Pertinent negatives include no anxiety, headaches, palpitations, peripheral edema or shortness of breath. Risk factors for coronary artery disease include dyslipidemia, male gender, obesity, sedentary lifestyle and diabetes mellitus. Past treatments include ACE inhibitors, diuretics, beta blockers and calcium channel blockers. The current treatment provides mild improvement. Hypertensive end-organ damage includes CAD/MI. There is no history of kidney disease, CVA, heart failure or a thyroid problem. There is no history of sleep apnea.      Review of Systems  Constitutional: Negative.   HENT: Negative.   Respiratory: Negative.  Negative for shortness of breath.   Cardiovascular: Negative.  Negative for palpitations.  Gastrointestinal: Negative.   Endocrine: Negative.   Genitourinary: Negative.   Musculoskeletal: Negative.   Neurological: Negative.  Negative for headaches.  Hematological: Negative.   Psychiatric/Behavioral: Negative.   All other systems reviewed and are negative.      Objective:   Physical Exam  Constitutional: He is oriented to person, place, and time. He appears well-developed and well-nourished. No distress.  Eyes: Pupils are equal, round, and reactive to light. Right eye exhibits no discharge. Left eye exhibits no discharge.  Neck: Normal range of motion. Neck supple. No thyromegaly present.  Cardiovascular: Normal rate, regular rhythm, normal heart sounds and intact distal pulses.   No murmur heard. Pulmonary/Chest: Effort normal and breath sounds normal. No respiratory distress. He has no wheezes.  Abdominal: Soft.  Bowel sounds are normal. He exhibits no distension. There is no tenderness.  Musculoskeletal: Normal range of motion. He exhibits no edema or tenderness.  Neurological: He is alert and oriented to person, place, and time. He has normal reflexes. No cranial nerve deficit.  Skin: Skin is warm and dry. No rash noted. No erythema.  Psychiatric: He has a normal mood and affect. His behavior is normal. Judgment and thought content normal.  Vitals reviewed.    BP 135/81 mmHg  Pulse 61  Temp(Src) 97.6 F (36.4 C) (Oral)  Ht 5\' 5"  (1.651 m)  Wt 395 lb 3.2 oz (179.262 kg)  BMI 65.76 kg/m2      Assessment & Plan:  1. Essential hypertension -Dash diet information given -Exercise encouraged - Stress Management  -Continue current meds -RTO in 3 MONTHS FOR Chronic follow up  Evelina Dun, FNP

## 2014-12-01 NOTE — Patient Instructions (Signed)
DASH Eating Plan DASH stands for "Dietary Approaches to Stop Hypertension." The DASH eating plan is a healthy eating plan that has been shown to reduce high blood pressure (hypertension). Additional health benefits may include reducing the risk of type 2 diabetes mellitus, heart disease, and stroke. The DASH eating plan may also help with weight loss. WHAT DO I NEED TO KNOW ABOUT THE DASH EATING PLAN? For the DASH eating plan, you will follow these general guidelines:  Choose foods with a percent daily value for sodium of less than 5% (as listed on the food label).  Use salt-free seasonings or herbs instead of table salt or sea salt.  Check with your health care provider or pharmacist before using salt substitutes.  Eat lower-sodium products, often labeled as "lower sodium" or "no salt added."  Eat fresh foods.  Eat more vegetables, fruits, and low-fat dairy products.  Choose whole grains. Look for the word "whole" as the first word in the ingredient list.  Choose fish and skinless chicken or turkey more often than red meat. Limit fish, poultry, and meat to 6 oz (170 g) each day.  Limit sweets, desserts, sugars, and sugary drinks.  Choose heart-healthy fats.  Limit cheese to 1 oz (28 g) per day.  Eat more home-cooked food and less restaurant, buffet, and fast food.  Limit fried foods.  Cook foods using methods other than frying.  Limit canned vegetables. If you do use them, rinse them well to decrease the sodium.  When eating at a restaurant, ask that your food be prepared with less salt, or no salt if possible. WHAT FOODS CAN I EAT? Seek help from a dietitian for individual calorie needs. Grains Whole grain or whole wheat bread. Brown rice. Whole grain or whole wheat pasta. Quinoa, bulgur, and whole grain cereals. Low-sodium cereals. Corn or whole wheat flour tortillas. Whole grain cornbread. Whole grain crackers. Low-sodium crackers. Vegetables Fresh or frozen vegetables  (raw, steamed, roasted, or grilled). Low-sodium or reduced-sodium tomato and vegetable juices. Low-sodium or reduced-sodium tomato sauce and paste. Low-sodium or reduced-sodium canned vegetables.  Fruits All fresh, canned (in natural juice), or frozen fruits. Meat and Other Protein Products Ground beef (85% or leaner), grass-fed beef, or beef trimmed of fat. Skinless chicken or turkey. Ground chicken or turkey. Pork trimmed of fat. All fish and seafood. Eggs. Dried beans, peas, or lentils. Unsalted nuts and seeds. Unsalted canned beans. Dairy Low-fat dairy products, such as skim or 1% milk, 2% or reduced-fat cheeses, low-fat ricotta or cottage cheese, or plain low-fat yogurt. Low-sodium or reduced-sodium cheeses. Fats and Oils Tub margarines without trans fats. Light or reduced-fat mayonnaise and salad dressings (reduced sodium). Avocado. Safflower, olive, or canola oils. Natural peanut or almond butter. Other Unsalted popcorn and pretzels. The items listed above may not be a complete list of recommended foods or beverages. Contact your dietitian for more options. WHAT FOODS ARE NOT RECOMMENDED? Grains White bread. White pasta. White rice. Refined cornbread. Bagels and croissants. Crackers that contain trans fat. Vegetables Creamed or fried vegetables. Vegetables in a cheese sauce. Regular canned vegetables. Regular canned tomato sauce and paste. Regular tomato and vegetable juices. Fruits Dried fruits. Canned fruit in light or heavy syrup. Fruit juice. Meat and Other Protein Products Fatty cuts of meat. Ribs, chicken wings, bacon, sausage, bologna, salami, chitterlings, fatback, hot dogs, bratwurst, and packaged luncheon meats. Salted nuts and seeds. Canned beans with salt. Dairy Whole or 2% milk, cream, half-and-half, and cream cheese. Whole-fat or sweetened yogurt. Full-fat   cheeses or blue cheese. Nondairy creamers and whipped toppings. Processed cheese, cheese spreads, or cheese  curds. Condiments Onion and garlic salt, seasoned salt, table salt, and sea salt. Canned and packaged gravies. Worcestershire sauce. Tartar sauce. Barbecue sauce. Teriyaki sauce. Soy sauce, including reduced sodium. Steak sauce. Fish sauce. Oyster sauce. Cocktail sauce. Horseradish. Ketchup and mustard. Meat flavorings and tenderizers. Bouillon cubes. Hot sauce. Tabasco sauce. Marinades. Taco seasonings. Relishes. Fats and Oils Butter, stick margarine, lard, shortening, ghee, and bacon fat. Coconut, palm kernel, or palm oils. Regular salad dressings. Other Pickles and olives. Salted popcorn and pretzels. The items listed above may not be a complete list of foods and beverages to avoid. Contact your dietitian for more information. WHERE CAN I FIND MORE INFORMATION? National Heart, Lung, and [employer] Institute: www.nhlbi.nih.gov/health/health-topics/topics/dash/ Document Released: 08/29/2011 Document Revised: 01/24/2014 Document Reviewed: 07/14/2013 ExitCare Patient Information 2015 [employer]. This information is not intended to replace advice given to you by your health care provider. Make sure you discuss any questions you have with your health care provider. Hypertension Hypertension, commonly called high blood pressure, is when the force of blood pumping through your arteries is too strong. Your arteries are the blood vessels that carry blood from your heart throughout your body. A blood pressure reading consists of a higher number over a lower number, such as 110/72. The higher number (systolic) is the pressure inside your arteries when your heart pumps. The lower number (diastolic) is the pressure inside your arteries when your heart relaxes. Ideally you want your blood pressure below 120/80. Hypertension forces your heart to work harder to pump blood. Your arteries may become narrow or stiff. Having hypertension puts you at risk for heart disease, stroke, and other problems.  RISK  FACTORS Some risk factors for high blood pressure are controllable. Others are not.  Risk factors you cannot control include:   Race. You may be at higher risk if you are African American.  Age. Risk increases with age.  Gender. Men are at higher risk than women before age 45 years. After age 65, women are at higher risk than men. Risk factors you can control include:  Not getting enough exercise or physical activity.  Being overweight.  Getting too much fat, sugar, calories, or salt in your diet.  Drinking too much alcohol. SIGNS AND SYMPTOMS Hypertension does not usually cause signs or symptoms. Extremely high blood pressure (hypertensive crisis) may cause headache, anxiety, shortness of breath, and nosebleed. DIAGNOSIS  To check if you have hypertension, your health care provider will measure your blood pressure while you are seated, with your arm held at the level of your heart. It should be measured at least twice using the same arm. Certain conditions can cause a difference in blood pressure between your right and left arms. A blood pressure reading that is higher than normal on one occasion does not mean that you need treatment. If one blood pressure reading is high, ask your health care provider about having it checked again. TREATMENT  Treating high blood pressure includes making lifestyle changes and possibly taking medicine. Living a healthy lifestyle can help lower high blood pressure. You may need to change some of your habits. Lifestyle changes may include:  Following the DASH diet. This diet is high in fruits, vegetables, and whole grains. It is low in salt, red meat, and added sugars.  Getting at least 2 hours of brisk physical activity every week.  Losing weight if necessary.  Not smoking.  Limiting   alcoholic beverages.  Learning ways to reduce stress. If lifestyle changes are not enough to get your blood pressure under control, your health care provider may  prescribe medicine. You may need to take more than one. Work closely with your health care provider to understand the risks and benefits. HOME CARE INSTRUCTIONS  Have your blood pressure rechecked as directed by your health care provider.   Take medicines only as directed by your health care provider. Follow the directions carefully. Blood pressure medicines must be taken as prescribed. The medicine does not work as well when you skip doses. Skipping doses also puts you at risk for problems.   Do not smoke.   Monitor your blood pressure at home as directed by your health care provider. SEEK MEDICAL CARE IF:   You think you are having a reaction to medicines taken.  You have recurrent headaches or feel dizzy.  You have swelling in your ankles.  You have trouble with your vision. SEEK IMMEDIATE MEDICAL CARE IF:  You develop a severe headache or confusion.  You have unusual weakness, numbness, or feel faint.  You have severe chest or abdominal pain.  You vomit repeatedly.  You have trouble breathing. MAKE SURE YOU:   Understand these instructions.  Will watch your condition.  Will get help right away if you are not doing well or get worse. Document Released: 09/09/2005 Document Revised: 01/24/2014 Document Reviewed: 07/02/2013 ExitCare Patient Information 2015 [employer]. This information is not intended to replace advice given to you by your health care provider. Make sure you discuss any questions you have with your health care provider.  

## 2014-12-03 ENCOUNTER — Other Ambulatory Visit: Payer: Self-pay | Admitting: Family Medicine

## 2015-01-04 ENCOUNTER — Telehealth: Payer: Self-pay | Admitting: Family

## 2015-01-06 ENCOUNTER — Other Ambulatory Visit: Payer: Self-pay | Admitting: Family

## 2015-01-10 NOTE — Telephone Encounter (Signed)
Patient has appointment on 4/20 with [facility]

## 2015-01-11 ENCOUNTER — Ambulatory Visit (INDEPENDENT_AMBULATORY_CARE_PROVIDER_SITE_OTHER): Payer: 59 | Admitting: Family

## 2015-01-11 ENCOUNTER — Encounter: Payer: Self-pay | Admitting: Family

## 2015-01-11 DIAGNOSIS — G8929 Other chronic pain: Secondary | ICD-10-CM

## 2015-01-11 DIAGNOSIS — M25562 Pain in left knee: Secondary | ICD-10-CM | POA: Diagnosis not present

## 2015-01-11 DIAGNOSIS — M25561 Pain in right knee: Secondary | ICD-10-CM | POA: Diagnosis not present

## 2015-01-11 MED ORDER — CELECOXIB 200 MG PO CAPS: 200.0000 mg | ORAL_CAPSULE | Freq: Two times a day (BID) | ORAL | Status: DC

## 2015-01-11 MED ORDER — CELECOXIB 200 MG PO CAPS: 200.0000 mg | ORAL_CAPSULE | Freq: Every day | ORAL | Status: DC

## 2015-01-11 MED ORDER — HYDROCODONE-ACETAMINOPHEN 5-325 MG PO TABS: 2.0000 | ORAL_TABLET | Freq: Four times a day (QID) | ORAL | Status: DC | PRN

## 2015-01-11 NOTE — Progress Notes (Signed)
   Subjective:    Patient ID: Ernest Martinez, male    DOB: 1956-03-30, 59 y.o.   MRN: [id number]  HPI Pt presents to the office today to discuss weight loss. Pt had an appointment with orthopedics about bilateral knee pain. Per the pt, he states they can not do anything until  He loses weight. Pt states he is trying to diet and has lost 10 lbs on his own. Pt states he needs a rx for more pain medications.    Review of Systems  Constitutional: Negative.   HENT: Negative.   Respiratory: Negative.   Cardiovascular: Negative.   Gastrointestinal: Negative.   Endocrine: Negative.   Genitourinary: Negative.   Musculoskeletal: Negative.   Neurological: Negative.   Hematological: Negative.   Psychiatric/Behavioral: Negative.   All other systems reviewed and are negative.      Objective:   Physical Exam  Constitutional: He is oriented to person, place, and time. He appears well-developed and well-nourished. No distress.  HENT:  Head: Normocephalic.  Right Ear: External ear normal.  Left Ear: External ear normal.  Mouth/Throat: Oropharynx is clear and moist.  Eyes: Pupils are equal, round, and reactive to light. Right eye exhibits no discharge. Left eye exhibits no discharge.  Neck: Normal range of motion. Neck supple. No thyromegaly present.  Cardiovascular: Normal rate, regular rhythm, normal heart sounds and intact distal pulses.   No murmur heard. Pulmonary/Chest: Effort normal and breath sounds normal. No respiratory distress. He has no wheezes.  Abdominal: Soft. Bowel sounds are normal. He exhibits no distension. There is no tenderness.  Musculoskeletal: Normal range of motion. He exhibits no edema or tenderness.  Neurological: He is alert and oriented to person, place, and time. He has normal reflexes. No cranial nerve deficit.  Skin: Skin is warm and dry. No rash noted. No erythema.  Psychiatric: He has a normal mood and affect. His behavior is normal. Judgment and thought  content normal.  Vitals reviewed.     BP 140/85 mmHg  Pulse 65  Temp(Src) 97.4 F (36.3 C) (Oral)  Ht 5\' 5"  (1.651 m)  Wt 384 lb 3.2 oz (174.272 kg)  BMI 63.93 kg/m2     Assessment & Plan:  1. Morbid obesity  2. Bilateral chronic knee pain  Encouraged to continue to lose weight Healthy diet and exercise Pain medication given to pt RTO prn  Meds ordered this encounter  Medications  . HYDROcodone-acetaminophen (NORCO/VICODIN) 5-325 MG per tablet    Sig: Take 2 tablets by mouth every 6 (six) hours as needed for moderate pain or severe pain.    Dispense:  90 tablet    Refill:  0    Order Specific Question:  Supervising Provider    Answer:  Chipper Herb [[id number]]  . celecoxib (CELEBREX) 200 MG capsule    Sig: Take 1 capsule (200 mg total) by mouth 2 (two) times daily.    Dispense:  90 capsule    Refill:  1    Order Specific Question:  Supervising Provider    Answer:  Chipper Herb [[id number]]     Evelina Dun, FNP

## 2015-01-11 NOTE — Patient Instructions (Signed)
Exercise to Lose Weight Exercise and a healthy diet may help you lose weight. Your doctor may suggest specific exercises. EXERCISE IDEAS AND TIPS  Choose low-cost things you enjoy doing, such as walking, bicycling, or exercising to workout videos.  Take stairs instead of the elevator.  Walk during your lunch break.  Park your car further away from work or school.  Go to a gym or an exercise class.  Start with 5 to 10 minutes of exercise each day. Build up to 30 minutes of exercise 4 to 6 days a week.  Wear shoes with good support and comfortable clothes.  Stretch before and after working out.  Work out until you breathe harder and your heart beats faster.  Drink extra water when you exercise.  Do not do so much that you hurt yourself, feel dizzy, or get very short of breath. Exercises that burn about 150 calories:  Running 1  miles in 15 minutes.  Playing volleyball for 45 to 60 minutes.  Washing and waxing a car for 45 to 60 minutes.  Playing touch football for 45 minutes.  Walking 1  miles in 35 minutes.  Pushing a stroller 1  miles in 30 minutes.  Playing basketball for 30 minutes.  Raking leaves for 30 minutes.  Bicycling 5 miles in 30 minutes.  Walking 2 miles in 30 minutes.  Dancing for 30 minutes.  Shoveling snow for 15 minutes.  Swimming laps for 20 minutes.  Walking up stairs for 15 minutes.  Bicycling 4 miles in 15 minutes.  Gardening for 30 to 45 minutes.  Jumping rope for 15 minutes.  Washing windows or floors for 45 to 60 minutes. Document Released: 10/12/2010 Document Revised: 12/02/2011 Document Reviewed: 10/12/2010 [employer] Patient Information 2015 [employer]. This information is not intended to replace advice given to you by your health care provider. Make sure you discuss any questions you have with your health care provider. Calorie Counting for Weight Loss Calories are energy you get from the things you eat and drink. Your  body uses this energy to keep you going throughout the day. The number of calories you eat affects your weight. When you eat more calories than your body needs, your body stores the extra calories as fat. When you eat fewer calories than your body needs, your body burns fat to get the energy it needs. Calorie counting means keeping track of how many calories you eat and drink each day. If you make sure to eat fewer calories than your body needs, you should lose weight. In order for calorie counting to work, you will need to eat the number of calories that are right for you in a day to lose a healthy amount of weight per week. A healthy amount of weight to lose per week is usually 1-2 lb (0.5-0.9 kg). A dietitian can determine how many calories you need in a day and give you suggestions on how to reach your calorie goal.  WHAT IS MY MY PLAN? My goal is to have __________ calories per day.  If I have this many calories per day, I should lose around __________ pounds per week. WHAT DO I NEED TO KNOW ABOUT CALORIE COUNTING? In order to meet your daily calorie goal, you will need to:  Find out how many calories are in each food you would like to eat. Try to do this before you eat.  Decide how much of the food you can eat.  Write down what you ate and   how many calories it had. Doing this is called keeping a food log. WHERE DO I FIND CALORIE INFORMATION? The number of calories in a food can be found on a Nutrition Facts label. Note that all the information on a label is based on a specific serving of the food. If a food does not have a Nutrition Facts label, try to look up the calories online or ask your dietitian for help. HOW DO I DECIDE HOW MUCH TO EAT? To decide how much of the food you can eat, you will need to consider both the number of calories in one serving and the size of one serving. This information can be found on the Nutrition Facts label. If a food does not have a Nutrition Facts label, look  up the information online or ask your dietitian for help. Remember that calories are listed per serving. If you choose to have more than one serving of a food, you will have to multiply the calories per serving by the amount of servings you plan to eat. For example, the label on a package of bread might say that a serving size is 1 slice and that there are 90 calories in a serving. If you eat 1 slice, you will have eaten 90 calories. If you eat 2 slices, you will have eaten 180 calories. HOW DO I KEEP A FOOD LOG? After each meal, record the following information in your food log:  What you ate.  How much of it you ate.  How many calories it had.  Then, add up your calories. Keep your food log near you, such as in a small notebook in your pocket. Another option is to use a mobile app or website. Some programs will calculate calories for you and show you how many calories you have left each time you add an item to the log. WHAT ARE SOME CALORIE COUNTING TIPS?  Use your calories on foods and drinks that will fill you up and not leave you hungry. Some examples of this include foods like nuts and nut butters, vegetables, lean proteins, and high-fiber foods (more than 5 g fiber per serving).  Eat nutritious foods and avoid empty calories. Empty calories are calories you get from foods or beverages that do not have many nutrients, such as candy and soda. It is better to have a nutritious high-calorie food (such as an avocado) than a food with few nutrients (such as a bag of chips).  Know how many calories are in the foods you eat most often. This way, you do not have to look up how many calories they have each time you eat them.  Look out for foods that may seem like low-calorie foods but are really high-calorie foods, such as baked goods, soda, and fat-free candy.  Pay attention to calories in drinks. Drinks such as sodas, specialty coffee drinks, alcohol, and juices have a lot of calories yet do  not fill you up. Choose low-calorie drinks like water and diet drinks.  Focus your calorie counting efforts on higher calorie items. Logging the calories in a garden salad that contains only vegetables is less important than calculating the calories in a milk shake.  Find a way of tracking calories that works for you. Get creative. Most people who are successful find ways to keep track of how much they eat in a day, even if they do not count every calorie. WHAT ARE SOME PORTION CONTROL TIPS?  Know how many calories are in a   serving. This will help you know how many servings of a certain food you can have.  Use a measuring cup to measure serving sizes. This is helpful when you start out. With time, you will be able to estimate serving sizes for some foods.  Take some time to put servings of different foods on your favorite plates, bowls, and cups so you know what a serving looks like.  Try not to eat straight from a bag or box. Doing this can lead to overeating. Put the amount you would like to eat in a cup or on a plate to make sure you are eating the right portion.  Use smaller plates, glasses, and bowls to prevent overeating. This is a quick and easy way to practice portion control. If your plate is smaller, less food can fit on it.  Try not to multitask while eating, such as watching TV or using your computer. If it is time to eat, sit down at a table and enjoy your food. Doing this will help you to start recognizing when you are full. It will also make you more aware of what and how much you are eating. HOW CAN I CALORIE COUNT WHEN EATING OUT?  Ask for smaller portion sizes or child-sized portions.  Consider sharing an entree and sides instead of getting your own entree.  If you get your own entree, eat only half. Ask for a box at the beginning of your meal and put the rest of your entree in it so you are not tempted to eat it.  Look for the calories on the menu. If calories are listed,  choose the lower calorie options.  Choose dishes that include vegetables, fruits, whole grains, low-fat dairy products, and lean protein. Focusing on smart food choices from each of the 5 food groups can help you stay on track at restaurants.  Choose items that are boiled, broiled, grilled, or steamed.  Choose water, milk, unsweetened iced tea, or other drinks without added sugars. If you want an alcoholic beverage, choose a lower calorie option. For example, a regular margarita can have up to 700 calories and a glass of wine has around 150.  Stay away from items that are buttered, battered, fried, or served with cream sauce. Items labeled "crispy" are usually fried, unless stated otherwise.  Ask for dressings, sauces, and syrups on the side. These are usually very high in calories, so do not eat much of them.  Watch out for salads. Many people think salads are a healthy option, but this is often not the case. Many salads come with bacon, fried chicken, lots of cheese, fried chips, and dressing. All of these items have a lot of calories. If you want a salad, choose a garden salad and ask for grilled meats or steak. Ask for the dressing on the side, or ask for olive oil and vinegar or lemon to use as dressing.  Estimate how many servings of a food you are given. For example, a serving of cooked rice is  cup or about the size of half a tennis ball or one cupcake wrapper. Knowing serving sizes will help you be aware of how much food you are eating at restaurants. The list below tells you how big or small some common portion sizes are based on everyday objects.  1 oz--4 stacked dice.  3 oz--1 deck of cards.  1 tsp--1 dice.  1 Tbsp-- a Ping-Pong ball.  2 Tbsp--1 Ping-Pong ball.   cup--1 tennis ball   or 1 cupcake wrapper.  1 cup--1 baseball. Document Released: 09/09/2005 Document Revised: 01/24/2014 Document Reviewed: 07/15/2013 [employer] Patient Information 2015 [employer]. This  information is not intended to replace advice given to you by your health care provider. Make sure you discuss any questions you have with your health care provider.  

## 2015-02-22 ENCOUNTER — Other Ambulatory Visit: Payer: Self-pay | Admitting: Family

## 2015-02-22 MED ORDER — HYDROCODONE-ACETAMINOPHEN 5-325 MG PO TABS: 2.0000 | ORAL_TABLET | Freq: Four times a day (QID) | ORAL | Status: DC | PRN

## 2015-02-22 NOTE — Telephone Encounter (Signed)
Patient aware rx is ready to be picked up 

## 2015-02-22 NOTE — Telephone Encounter (Signed)
RX ready for pick up 

## 2015-04-04 ENCOUNTER — Telehealth: Payer: Self-pay | Admitting: Family

## 2015-04-04 MED ORDER — HYDROCODONE-ACETAMINOPHEN 5-325 MG PO TABS: 2.0000 | ORAL_TABLET | Freq: Four times a day (QID) | ORAL | Status: DC | PRN

## 2015-04-04 MED ORDER — CELECOXIB 200 MG PO CAPS: 200.0000 mg | ORAL_CAPSULE | Freq: Every day | ORAL | Status: DC

## 2015-04-04 NOTE — Telephone Encounter (Signed)
lmovm that written Rx is at front desk ready for pickup 

## 2015-04-04 NOTE — Telephone Encounter (Signed)
RX ready for pick up 

## 2015-05-09 ENCOUNTER — Telehealth: Payer: Self-pay | Admitting: *Deleted

## 2015-05-09 NOTE — Telephone Encounter (Signed)
Left message for patient to call back regarding follow up appointment on elevated BP.

## 2015-05-24 ENCOUNTER — Encounter: Payer: Self-pay | Admitting: Family

## 2015-05-24 ENCOUNTER — Encounter (INDEPENDENT_AMBULATORY_CARE_PROVIDER_SITE_OTHER): Payer: Self-pay

## 2015-05-24 ENCOUNTER — Ambulatory Visit (INDEPENDENT_AMBULATORY_CARE_PROVIDER_SITE_OTHER): Payer: 59 | Admitting: Family

## 2015-05-24 VITALS — BP 158/93 | HR 61 | Temp 98.0°F | Ht 65.0 in | Wt 381.2 lb

## 2015-05-24 DIAGNOSIS — E1165 Type 2 diabetes mellitus with hyperglycemia: Secondary | ICD-10-CM

## 2015-05-24 DIAGNOSIS — I10 Essential (primary) hypertension: Secondary | ICD-10-CM | POA: Diagnosis not present

## 2015-05-24 DIAGNOSIS — E119 Type 2 diabetes mellitus without complications: Secondary | ICD-10-CM | POA: Insufficient documentation

## 2015-05-24 LAB — POCT GLYCOSYLATED HEMOGLOBIN (HGB A1C): HEMOGLOBIN A1C: 6.7

## 2015-05-24 MED ORDER — LISINOPRIL-HYDROCHLOROTHIAZIDE 20-12.5 MG PO TABS: 2.0000 | ORAL_TABLET | Freq: Every day | ORAL | Status: DC

## 2015-05-24 MED ORDER — HYDROCODONE-ACETAMINOPHEN 5-325 MG PO TABS: 1.0000 | ORAL_TABLET | Freq: Four times a day (QID) | ORAL | Status: DC | PRN

## 2015-05-24 MED ORDER — LISINOPRIL-HYDROCHLOROTHIAZIDE 20-12.5 MG PO TABS: 1.0000 | ORAL_TABLET | Freq: Every day | ORAL | Status: DC

## 2015-05-24 MED ORDER — METFORMIN HCL ER 500 MG PO TB24: 500.0000 mg | ORAL_TABLET | Freq: Every day | ORAL | Status: DC

## 2015-05-24 NOTE — Addendum Note (Signed)
Addended by: Evelina Dun A on: 05/24/2015 03:06 PM   Modules accepted: Orders

## 2015-05-24 NOTE — Progress Notes (Signed)
Subjective:    Patient ID: Ernest Martinez, male    DOB: 21-Dec-1955, 59 y.o.   MRN: [id number]  Pt presents to the office today to recheck HTN. Pt's BP is not at goal. Hypertension This is a chronic problem. The current episode started more than 1 year ago. The problem has been waxing and waning since onset. The problem is uncontrolled. Pertinent negatives include no anxiety, blurred vision, headaches, palpitations, peripheral edema or shortness of breath. Risk factors for coronary artery disease include diabetes mellitus, dyslipidemia, obesity, male gender and sedentary lifestyle. Past treatments include beta blockers, calcium channel blockers, ACE inhibitors and diuretics. The current treatment provides mild improvement. There is no history of kidney disease, CAD/MI, CVA, heart failure or a thyroid problem. There is no history of sleep apnea.  Diabetes He presents for his initial diabetic visit. He has type 2 diabetes mellitus. His disease course has been worsening. Pertinent negatives for hypoglycemia include no confusion or headaches. Pertinent negatives for diabetes include no blurred vision, no foot paresthesias, no foot ulcerations and no visual change. Pertinent negatives for hypoglycemia complications include no blackouts and no hospitalization. Symptoms are worsening. Pertinent negatives for diabetic complications include no CVA, heart disease, nephropathy or peripheral neuropathy. Risk factors for coronary artery disease include diabetes mellitus, dyslipidemia, male sex, hypertension, sedentary lifestyle and obesity. Current diabetic treatment includes oral agent (monotherapy). He is compliant with treatment some of the time. He is following a generally unhealthy diet. An ACE inhibitor/angiotensin II receptor blocker is being taken. Eye exam is current.      Review of Systems  Constitutional: Negative.   HENT: Negative.   Eyes: Negative for blurred vision.  Respiratory: Negative.   Negative for shortness of breath.   Cardiovascular: Negative.  Negative for palpitations.  Gastrointestinal: Negative.   Endocrine: Negative.   Genitourinary: Negative.   Musculoskeletal: Negative.   Neurological: Negative.  Negative for headaches.  Hematological: Negative.   Psychiatric/Behavioral: Negative.  Negative for confusion.  All other systems reviewed and are negative.      Objective:   Physical Exam  Constitutional: He is oriented to person, place, and time. He appears well-developed and well-nourished. No distress.  Morbid obese   HENT:  Head: Normocephalic.  Right Ear: External ear normal.  Left Ear: External ear normal.  Nose: Nose normal.  Mouth/Throat: Oropharynx is clear and moist.  Eyes: Pupils are equal, round, and reactive to light. Right eye exhibits no discharge. Left eye exhibits no discharge.  Neck: Normal range of motion. Neck supple. No thyromegaly present.  Cardiovascular: Normal rate, regular rhythm, normal heart sounds and intact distal pulses.   No murmur heard. Pulmonary/Chest: Effort normal and breath sounds normal. No respiratory distress. He has no wheezes.  Abdominal: Soft. Bowel sounds are normal. He exhibits no distension. There is no tenderness.  Musculoskeletal: Normal range of motion. He exhibits no edema or tenderness.  Neurological: He is alert and oriented to person, place, and time. He has normal reflexes. No cranial nerve deficit.  Skin: Skin is warm and dry. No rash noted. No erythema.  Psychiatric: He has a normal mood and affect. His behavior is normal. Judgment and thought content normal.  Vitals reviewed.    BP 158/93 mmHg  Pulse 61  Temp(Src) 98 F (36.7 C) (Oral)  Ht _0  (1.651 m)  Wt 381 lb 3.2 oz (172.911 kg)  BMI 63.43 kg/m2      Assessment & Plan:  1. Essential hypertension -Added HCTZ 25 mg  today --Daily blood pressure log given with instructions on how to fill out and told to bring to next visit -Dash diet  information given -Exercise encouraged - Stress Management  -Continue current meds -RTO in 2 weeks - lisinopril-hydrochlorothiazide (ZESTORETIC) 20-12.5 MG per tablet; Take 1 tablet by mouth daily.  Dispense: 90 tablet; Refill: 3 - CMP14+EGFR  2. Morbid obesity -Diet and exercise encouraged - CMP14+EGFR  3. Type 2 diabetes mellitus with hyperglycemia -Low carb diet  -Changed metformin to XR today because pt states he forgets to take it twice a day -Discussed taking with large meal to help decrease diarrhea - metFORMIN (GLUCOPHAGE XR) 500 MG 24 hr tablet; Take 1 tablet (500 mg total) by mouth daily with breakfast.  Dispense: 90 tablet; Refill: 1 - CMP14+EGFR - POCT glycosylated hemoglobin (Hb A1C)   Continue all meds Labs pending Health Maintenance reviewed Diet and exercise encouraged RTO 2 weeks  Evelina Dun, FNP

## 2015-05-24 NOTE — Patient Instructions (Signed)
Type 2 Diabetes Mellitus Type 2 diabetes mellitus, often simply referred to as type 2 diabetes, is a long-lasting (chronic) disease. In type 2 diabetes, the pancreas does not make enough insulin (a hormone), the cells are less responsive to the insulin that is made (insulin resistance), or both. Normally, insulin moves sugars from food into the tissue cells. The tissue cells use the sugars for energy. The lack of insulin or the lack of normal response to insulin causes excess sugars to build up in the blood instead of going into the tissue cells. As a result, high blood sugar (hyperglycemia) develops. The effect of high sugar (glucose) levels can cause many complications. Type 2 diabetes was also previously called adult-onset diabetes, but it can occur at any age.  RISK FACTORS  A person is predisposed to developing type 2 diabetes if someone in the family has the disease and also has one or more of the following primary risk factors:  Overweight.  An inactive lifestyle.  A history of consistently eating high-calorie foods. Maintaining a normal weight and regular physical activity can reduce the chance of developing type 2 diabetes. SYMPTOMS  A person with type 2 diabetes may not show symptoms initially. The symptoms of type 2 diabetes appear slowly. The symptoms include:  Increased thirst (polydipsia).  Increased urination (polyuria).  Increased urination during the night (nocturia).  Weight loss. This weight loss may be rapid.  Frequent, recurring infections.  Tiredness (fatigue).  Weakness.  Vision changes, such as blurred vision.  Fruity smell to your breath.  Abdominal pain.  Nausea or vomiting.  Cuts or bruises which are slow to heal.  Tingling or numbness in the hands or feet. DIAGNOSIS Type 2 diabetes is frequently not diagnosed until complications of diabetes are present. Type 2 diabetes is diagnosed when symptoms or complications are present and when blood  glucose levels are increased. Your blood glucose level may be checked by one or more of the following blood tests:  A fasting blood glucose test. You will not be allowed to eat for at least 8 hours before a blood sample is taken.  A random blood glucose test. Your blood glucose is checked at any time of the day regardless of when you ate.  A hemoglobin A1c blood glucose test. A hemoglobin A1c test provides information about blood glucose control over the previous 3 months.  An oral glucose tolerance test (OGTT). Your blood glucose is measured after you have not eaten (fasted) for 2 hours and then after you drink a glucose-containing beverage. TREATMENT   You may need to take insulin or diabetes medicine daily to keep blood glucose levels in the desired range.  If you use insulin, you may need to adjust the dosage depending on the carbohydrates that you eat with each meal or snack. The treatment goal is to maintain the before meal blood sugar (preprandial glucose) level at 70-130 mg/dL. HOME CARE INSTRUCTIONS   Have your hemoglobin A1c level checked twice a year.  Perform daily blood glucose monitoring as directed by your health care provider.  Monitor urine ketones when you are ill and as directed by your health care provider.  Take your diabetes medicine or insulin as directed by your health care provider to maintain your blood glucose levels in the desired range.  Never run out of diabetes medicine or insulin. It is needed every day.  If you are using insulin, you may need to adjust the amount of insulin given based on your intake of   carbohydrates. Carbohydrates can raise blood glucose levels but need to be included in your diet. Carbohydrates provide vitamins, minerals, and fiber which are an essential part of a healthy diet. Carbohydrates are found in fruits, vegetables, whole grains, dairy products, legumes, and foods containing added sugars.  Eat healthy foods. You should make an  appointment to see a registered dietitian to help you create an eating plan that is right for you.  Lose weight if you are overweight.  Carry a medical alert card or wear your medical alert jewelry.  Carry a 15-gram carbohydrate snack with you at all times to treat low blood glucose (hypoglycemia). Some examples of 15-gram carbohydrate snacks include:  Glucose tablets, 3 or 4.  Glucose gel, 15-gram tube.  Raisins, 2 tablespoons (24 grams).  Jelly beans, 6.  Animal crackers, 8.  Regular pop, 4 ounces (120 mL).  Gummy treats, 9.  Recognize hypoglycemia. Hypoglycemia occurs with blood glucose levels of 70 mg/dL and below. The risk for hypoglycemia increases when fasting or skipping meals, during or after intense exercise, and during sleep. Hypoglycemia symptoms can include:  Tremors or shakes.  Decreased ability to concentrate.  Sweating.  Increased heart rate.  Headache.  Dry mouth.  Hunger.  Irritability.  Anxiety.  Restless sleep.  Altered speech or coordination.  Confusion.  Treat hypoglycemia promptly. If you are alert and able to safely swallow, follow the 15:15 rule:  Take 15-20 grams of rapid-acting glucose or carbohydrate. Rapid-acting options include glucose gel, glucose tablets, or 4 ounces (120 mL) of fruit juice, regular soda, or low-fat milk.  Check your blood glucose level 15 minutes after taking the glucose.  Take 15-20 grams more of glucose if the repeat blood glucose level is still 70 mg/dL or below.  Eat a meal or snack within 1 hour once blood glucose levels return to normal.  Be alert to feeling very thirsty and urinating more frequently than usual, which are early signs of hyperglycemia. An early awareness of hyperglycemia allows for prompt treatment. Treat hyperglycemia as directed by your health care provider.  Engage in at least 150 minutes of moderate-intensity physical activity a week, spread over at least 3 days of the week or as  directed by your health care provider. In addition, you should engage in resistance exercise at least 2 times a week or as directed by your health care provider. Try to spend no more than 90 minutes at one time inactive.  Adjust your medicine and food intake as needed if you start a new exercise or sport.  Follow your sick-day plan anytime you are unable to eat or drink as usual.  Do not use any tobacco products including cigarettes, chewing tobacco, or electronic cigarettes. If you need help quitting, ask your health care provider.  Limit alcohol intake to no more than 1 drink per day for nonpregnant women and 2 drinks per day for men. You should drink alcohol only when you are also eating food. Talk with your health care provider whether alcohol is safe for you. Tell your health care provider if you drink alcohol several times a week.  Keep all follow-up visits as directed by your health care provider. This is important.  Schedule an eye exam soon after the diagnosis of type 2 diabetes and then annually.  Perform daily skin and foot care. Examine your skin and feet daily for cuts, bruises, redness, nail problems, bleeding, blisters, or sores. A foot exam by a health care provider should be done annually.    Brush your teeth and gums at least twice a day and floss at least once a day. Follow up with your dentist regularly.  Share your diabetes management plan with your workplace or school.  Stay up-to-date with immunizations. It is recommended that people with diabetes who are over 57 years old get the pneumonia vaccine. In some cases, two separate shots may be given. Ask your health care provider if your pneumonia vaccination is up-to-date.  Learn to manage stress.  Obtain ongoing diabetes education and support as needed.  Participate in or seek rehabilitation as needed to maintain or improve independence and quality of life. Request a physical or occupational therapy referral if you are  having foot or hand numbness, or difficulties with grooming, dressing, eating, or physical activity. SEEK MEDICAL CARE IF:   You are unable to eat food or drink fluids for more than 6 hours.  You have nausea and vomiting for more than 6 hours.  Your blood glucose level is over 240 mg/dL.  There is a change in mental status.  You develop an additional serious illness.  You have diarrhea for more than 6 hours.  You have been sick or have had a fever for a couple of days and are not getting better.  You have pain during any physical activity.  SEEK IMMEDIATE MEDICAL CARE IF:  You have difficulty breathing.  You have moderate to large ketone levels. MAKE SURE YOU:  Understand these instructions.  Will watch your condition.  Will get help right away if you are not doing well or get worse. Document Released: 09/09/2005 Document Revised: 01/24/2014 Document Reviewed: 04/07/2012 [facility] Patient Information 2015 [place], [place]. This information is not intended to replace advice given to you by your health care provider. Make sure you discuss any questions you have with your health care provider. Diabetes Mellitus and Food It is important for you to manage your blood sugar (glucose) level. Your blood glucose level can be greatly affected by what you eat. Eating healthier foods in the appropriate amounts throughout the day at about the same time each day will help you control your blood glucose level. It can also help slow or prevent worsening of your diabetes mellitus. Healthy eating may even help you improve the level of your blood pressure and reach or maintain a healthy weight.  HOW CAN FOOD AFFECT ME? Carbohydrates Carbohydrates affect your blood glucose level more than any other type of food. Your dietitian will help you determine how many carbohydrates to eat at each meal and teach you how to count carbohydrates. Counting carbohydrates is important to keep your blood glucose at a  healthy level, especially if you are using insulin or taking certain medicines for diabetes mellitus. Alcohol Alcohol can cause sudden decreases in blood glucose (hypoglycemia), especially if you use insulin or take certain medicines for diabetes mellitus. Hypoglycemia can be a life-threatening condition. Symptoms of hypoglycemia (sleepiness, dizziness, and disorientation) are similar to symptoms of having too much alcohol.  If your health care provider has given you approval to drink alcohol, do so in moderation and use the following guidelines:  Women should not have more than one drink per day, and men should not have more than two drinks per day. One drink is equal to:  12 oz of beer.  5 oz of wine.  1 oz of hard liquor.  Do not drink on an empty stomach.  Keep yourself hydrated. Have water, diet soda, or unsweetened iced tea.  Regular soda, juice,  and other mixers might contain a lot of carbohydrates and should be counted. WHAT FOODS ARE NOT RECOMMENDED? As you make food choices, it is important to remember that all foods are not the same. Some foods have fewer nutrients per serving than other foods, even though they might have the same number of calories or carbohydrates. It is difficult to get your body what it needs when you eat foods with fewer nutrients. Examples of foods that you should avoid that are high in calories and carbohydrates but low in nutrients include:  Trans fats (most processed foods list trans fats on the Nutrition Facts label).  Regular soda.  Juice.  Candy.  Sweets, such as cake, pie, doughnuts, and cookies.  Fried foods. WHAT FOODS CAN I EAT? Have nutrient-rich foods, which will nourish your body and keep you healthy. The food you should eat also will depend on several factors, including:  The calories you need.  The medicines you take.  Your weight.  Your blood glucose level.  Your blood pressure level.  Your cholesterol level. You also  should eat a variety of foods, including:  Protein, such as meat, poultry, fish, tofu, nuts, and seeds (lean animal proteins are best).  Fruits.  Vegetables.  Dairy products, such as milk, cheese, and yogurt (low fat is best).  Breads, grains, pasta, cereal, rice, and beans.  Fats such as olive oil, trans fat-free margarine, canola oil, avocado, and olives. DOES EVERYONE WITH DIABETES MELLITUS HAVE THE SAME MEAL PLAN? Because every person with diabetes mellitus is different, there is not one meal plan that works for everyone. It is very important that you meet with a dietitian who will help you create a meal plan that is just right for you. Document Released: 06/06/2005 Document Revised: 09/14/2013 Document Reviewed: 08/06/2013 [employer] Patient Information 2015 [employer]. This information is not intended to replace advice given to you by your health care provider. Make sure you discuss any questions you have with your health care provider.  

## 2015-05-24 NOTE — Addendum Note (Signed)
Addended by: Evelina Dun A on: 05/24/2015 03:11 PM   Modules accepted: Orders

## 2015-05-25 ENCOUNTER — Ambulatory Visit: Payer: 59 | Admitting: Family

## 2015-05-25 LAB — CMP14+EGFR
ALT: 21 IU/L (ref 0–44)
AST: 16 IU/L (ref 0–40)
Albumin/Globulin Ratio: 1.5 (ref 1.1–2.5)
Albumin: 4.1 g/dL (ref 3.5–5.5)
Alkaline Phosphatase: 67 IU/L (ref 39–117)
BILIRUBIN TOTAL: 0.5 mg/dL (ref 0.0–1.2)
BUN/Creatinine Ratio: 15 (ref 9–20)
BUN: 12 mg/dL (ref 6–24)
CALCIUM: 9.3 mg/dL (ref 8.7–10.2)
CHLORIDE: 99 mmol/L (ref 97–108)
CO2: 28 mmol/L (ref 18–29)
Creatinine, Ser: 0.78 mg/dL (ref 0.76–1.27)
GFR calc Af Amer: 115 mL/min/{1.73_m2} (ref >59)
GFR, EST NON AFRICAN AMERICAN: 99 mL/min/{1.73_m2} (ref >59)
GLUCOSE: 104 mg/dL — AB (ref 65–99)
Globulin, Total: 2.8 g/dL (ref 1.5–4.5)
POTASSIUM: 4.6 mmol/L (ref 3.5–5.2)
Sodium: 141 mmol/L (ref 134–144)
TOTAL PROTEIN: 6.9 g/dL (ref 6.0–8.5)

## 2015-06-02 ENCOUNTER — Telehealth: Payer: Self-pay | Admitting: Family Medicine

## 2015-06-02 DIAGNOSIS — I10 Essential (primary) hypertension: Secondary | ICD-10-CM

## 2015-06-02 NOTE — Telephone Encounter (Signed)
This was explained to the patient's wife.

## 2015-06-02 NOTE — Telephone Encounter (Signed)
Patient's and his wife are confused about his blood pressure medications.  The bottle of lisinopril/hctz says to take 1 tablet once daily. Your office note says the same thing but the mediation list says to take 2 tablets once daily. Which direction is correct. They also want to verify that he should continue amlodipine and carvedilol.

## 2015-06-02 NOTE — Telephone Encounter (Signed)
Pt needs to take 2 tabs of  lisinopril/Hctz 20-12.5 mg and continue amlodipine and carvedilol and follow up

## 2015-06-08 ENCOUNTER — Ambulatory Visit: Payer: Self-pay | Admitting: Family

## 2015-06-13 ENCOUNTER — Telehealth: Payer: Self-pay | Admitting: Family Medicine

## 2015-06-13 NOTE — Telephone Encounter (Signed)
Wife aware that we do not have samples and was given information about getting a celebrex coupon from the company.

## 2015-06-27 ENCOUNTER — Other Ambulatory Visit: Payer: Self-pay | Admitting: Family

## 2015-07-13 ENCOUNTER — Ambulatory Visit (INDEPENDENT_AMBULATORY_CARE_PROVIDER_SITE_OTHER): Payer: 59 | Admitting: Family

## 2015-07-13 ENCOUNTER — Encounter: Payer: Self-pay | Admitting: Family

## 2015-07-13 VITALS — BP 140/83 | HR 61 | Temp 97.7°F | Ht 65.0 in | Wt 377.4 lb

## 2015-07-13 DIAGNOSIS — M1990 Unspecified osteoarthritis, unspecified site: Secondary | ICD-10-CM

## 2015-07-13 DIAGNOSIS — IMO0001 Reserved for inherently not codable concepts without codable children: Secondary | ICD-10-CM

## 2015-07-13 DIAGNOSIS — Z136 Encounter for screening for cardiovascular disorders: Secondary | ICD-10-CM

## 2015-07-13 DIAGNOSIS — I10 Essential (primary) hypertension: Secondary | ICD-10-CM

## 2015-07-13 DIAGNOSIS — E1165 Type 2 diabetes mellitus with hyperglycemia: Secondary | ICD-10-CM

## 2015-07-13 DIAGNOSIS — Z13220 Encounter for screening for lipoid disorders: Secondary | ICD-10-CM

## 2015-07-13 LAB — POCT UA - MICROALBUMIN: MICROALBUMIN (UR) POC: 20 mg/L

## 2015-07-13 MED ORDER — ASPIRIN EC 81 MG PO TBEC: 81.0000 mg | DELAYED_RELEASE_TABLET | Freq: Every day | ORAL | Status: AC

## 2015-07-13 MED ORDER — DICLOFENAC SODIUM 75 MG PO TBEC
75.0000 mg | DELAYED_RELEASE_TABLET | Freq: Two times a day (BID) | ORAL | Status: DC
Start: 2015-07-13 — End: 2017-03-14

## 2015-07-13 MED ORDER — HYDROCODONE-ACETAMINOPHEN 5-325 MG PO TABS: 1.0000 | ORAL_TABLET | Freq: Four times a day (QID) | ORAL | Status: DC | PRN

## 2015-07-13 MED ORDER — LISINOPRIL-HYDROCHLOROTHIAZIDE 20-12.5 MG PO TABS: 2.0000 | ORAL_TABLET | Freq: Every day | ORAL | Status: DC

## 2015-07-13 NOTE — Progress Notes (Signed)
Subjective:    Patient ID: Ernest Martinez, male    DOB: 04-08-1956, 59 y.o.   MRN: [id number]  Pt presents to the office today for chronic follow up.  Hypertension This is a chronic problem. The current episode started more than 1 year ago. The problem has been resolved since onset. The problem is controlled. Pertinent negatives include no anxiety, blurred vision, headaches, palpitations, peripheral edema or shortness of breath. Risk factors for coronary artery disease include diabetes mellitus, dyslipidemia, obesity, male gender and sedentary lifestyle. Past treatments include beta blockers, calcium channel blockers, ACE inhibitors and diuretics. The current treatment provides mild improvement. There is no history of kidney disease, CAD/MI, CVA, heart failure or a thyroid problem. There is no history of sleep apnea.  Diabetes He presents for his initial diabetic visit. He has type 2 diabetes mellitus. His disease course has been worsening. Pertinent negatives for hypoglycemia include no confusion or headaches. Pertinent negatives for diabetes include no blurred vision, no foot paresthesias, no foot ulcerations and no visual change. Pertinent negatives for hypoglycemia complications include no blackouts and no hospitalization. Symptoms are worsening. Pertinent negatives for diabetic complications include no CVA, heart disease, nephropathy or peripheral neuropathy. Risk factors for coronary artery disease include diabetes mellitus, dyslipidemia, male sex, hypertension, sedentary lifestyle and obesity. Current diabetic treatment includes oral agent (monotherapy). He is compliant with treatment some of the time. He is following a generally unhealthy diet. An ACE inhibitor/angiotensin II receptor blocker is being taken. Eye exam is current.  Arthritis Presents for follow-up visit. The disease course has been fluctuating. He complains of pain and joint warmth. Affected locations include the left knee and  right knee. His pain is at a severity of 7/10. Associated symptoms include pain while resting. Pertinent negatives include no dry eyes. His past medical history is significant for osteoarthritis. Past treatments include NSAIDs and rest. The treatment provided moderate relief. Factors aggravating his arthritis include climbing stairs and activity. Compliance with prior treatments has been good.      Review of Systems  Constitutional: Negative.   HENT: Negative.   Eyes: Negative for blurred vision.  Respiratory: Negative.  Negative for shortness of breath.   Cardiovascular: Negative.  Negative for palpitations.  Gastrointestinal: Negative.   Endocrine: Negative.   Genitourinary: Negative.   Musculoskeletal: Positive for arthritis.  Neurological: Negative.  Negative for headaches.  Hematological: Negative.   Psychiatric/Behavioral: Negative.  Negative for confusion.  All other systems reviewed and are negative.      Objective:   Physical Exam  Constitutional: He is oriented to person, place, and time. He appears well-developed and well-nourished. No distress.  HENT:  Head: Normocephalic.  Right Ear: External ear normal.  Left Ear: External ear normal.  Nose: Nose normal.  Mouth/Throat: Oropharynx is clear and moist.  Eyes: Pupils are equal, round, and reactive to light. Right eye exhibits no discharge. Left eye exhibits no discharge.  Neck: Normal range of motion. Neck supple. No thyromegaly present.  Cardiovascular: Normal rate, regular rhythm, normal heart sounds and intact distal pulses.   No murmur heard. Pulmonary/Chest: Effort normal and breath sounds normal. No respiratory distress. He has no wheezes.  Abdominal: Soft. Bowel sounds are normal. He exhibits no distension. There is no tenderness.  Musculoskeletal: Normal range of motion. He exhibits no edema or tenderness.  Neurological: He is alert and oriented to person, place, and time. He has normal reflexes. No cranial  nerve deficit.  Skin: Skin is warm and dry. No  rash noted. No erythema.  Psychiatric: He has a normal mood and affect. His behavior is normal. Judgment and thought content normal.  Vitals reviewed.   BP 140/83 mmHg  Pulse 61  Temp(Src) 97.7 F (36.5 C) (Oral)  Ht 5' 5"  (1.651 m)  Wt 377 lb 6.4 oz (171.188 kg)  BMI 62.80 kg/m2       Assessment & Plan:  1. Essential hypertension - lisinopril-hydrochlorothiazide (ZESTORETIC) 20-12.5 MG tablet; Take 2 tablets by mouth daily.  Dispense: 180 tablet; Refill: 3 - CMP14+EGFR  2. Type 2 diabetes mellitus with hyperglycemia, without long-term current use of insulin (HCC) - CMP14+EGFR - POCT UA - Microalbumin  3. Morbid obesity, unspecified obesity type ([place]) - CMP14+EGFR  4. Encounter for cholesteral screening for cardiovascular disease - CMP14+EGFR - Lipid panel  5. Arthritis -Pt's Celebrex d/c today because of insurance Voltaren 75 mg ordered today - diclofenac (VOLTAREN) 75 MG EC tablet; Take 1 tablet (75 mg total) by mouth 2 (two) times daily.  Dispense: 60 tablet; Refill: 3   Continue all meds Labs pending Health Maintenance reviewed Diet and exercise encouraged RTO 3 months  Evelina Dun, FNP

## 2015-07-13 NOTE — Patient Instructions (Signed)

## 2015-07-13 NOTE — Addendum Note (Signed)
Addended by: Earlene Plater on: 07/13/2015 10:02 AM   Modules accepted: Orders, SmartSet

## 2015-07-14 LAB — MICROALBUMIN, URINE: Microalbumin, Urine: 22.8 ug/mL

## 2015-07-18 ENCOUNTER — Telehealth: Payer: Self-pay | Admitting: Family

## 2015-07-18 ENCOUNTER — Ambulatory Visit (INDEPENDENT_AMBULATORY_CARE_PROVIDER_SITE_OTHER): Payer: Self-pay | Admitting: Family Medicine

## 2015-07-18 ENCOUNTER — Encounter: Payer: Self-pay | Admitting: Family Medicine

## 2015-07-18 VITALS — BP 124/80 | HR 64 | Temp 97.2°F | Ht 65.0 in | Wt 377.0 lb

## 2015-07-18 DIAGNOSIS — Z139 Encounter for screening, unspecified: Secondary | ICD-10-CM

## 2015-07-18 DIAGNOSIS — Z024 Encounter for examination for driving license: Secondary | ICD-10-CM

## 2015-07-18 LAB — POCT URINALYSIS DIPSTICK
GLUCOSE UA: NEGATIVE
Ketones, UA: NEGATIVE
Leukocytes, UA: NEGATIVE
NITRITE UA: NEGATIVE
Protein, UA: NEGATIVE
Spec Grav, UA: 1.02
UROBILINOGEN UA: NEGATIVE
pH, UA: 5

## 2015-07-18 NOTE — Progress Notes (Signed)
Subjective:  Patient ID: Ernest Martinez, male    DOB: 09/06/1956  Age: 59 y.o. MRN: [id number]  CC: No chief complaint on file.   HPI Ernest Martinez presents for DOT CPE  History Ernest Martinez has a past medical history of Hypertension; Morbid obesity (Ernest Martinez); Abnormal EKG; GERD (gastroesophageal reflux disease); Borderline diabetes; Bladder filling defect; Bacteremia due to Gram-negative bacteria (Ernest Martinez); Hydrocele of testis; and Dislocation of right shoulder joint (sept 2014).   He has past surgical history that includes None and Hydrocele surgery (Right, 08/16/2013).   His family history includes Cancer in his father. There is no history of CAD.He reports that he has never smoked. He has never used smokeless tobacco. He reports that he drinks alcohol. He reports that he does not use illicit drugs.  Outpatient Prescriptions Prior to Visit  Medication Sig Dispense Refill  . amLODipine (NORVASC) 10 MG tablet TAKE  ONE TABLET BY MOUTH EVERY MORNING 30 tablet 4  . aspirin EC 81 MG tablet Take 1 tablet (81 mg total) by mouth daily. 90 tablet 1  . carvedilol (COREG) 12.5 MG tablet Take 1 tablet (12.5 mg total) by mouth 2 (two) times daily with a meal. 180 tablet 3  . diclofenac (VOLTAREN) 75 MG EC tablet Take 1 tablet (75 mg total) by mouth 2 (two) times daily. 60 tablet 3  . furosemide (LASIX) 20 MG tablet TAKE  ONE TABLET BY MOUTH EVERY MORNING 30 tablet 5  . HYDROcodone-acetaminophen (NORCO/VICODIN) 5-325 MG tablet Take 1-2 tablets by mouth every 6 (six) hours as needed for moderate pain or severe pain. 90 tablet 0  . ibuprofen (ADVIL) 200 MG tablet Take 600 mg by mouth daily as needed.    Marland Kitchen lisinopril-hydrochlorothiazide (ZESTORETIC) 20-12.5 MG tablet Take 2 tablets by mouth daily. 180 tablet 3  . metFORMIN (GLUCOPHAGE XR) 500 MG 24 hr tablet Take 1 tablet (500 mg total) by mouth daily with breakfast. 90 tablet 1  . omeprazole (PRILOSEC) 20 MG capsule TAKE ONE CAPSULE BY MOUTH ONE TIME  DAILY 30 capsule 4   No facility-administered medications prior to visit.    ROS Review of Systems  Constitutional: Negative for fever, chills and diaphoresis.  HENT: Negative for congestion, rhinorrhea and sore throat.   Respiratory: Negative for cough, shortness of breath and wheezing.   Cardiovascular: Negative for chest pain.  Gastrointestinal: Negative for nausea, vomiting, abdominal pain, diarrhea, constipation and abdominal distention.  Genitourinary: Negative for dysuria and frequency.  Musculoskeletal: Negative for joint swelling and arthralgias.  Skin: Negative for rash.  Neurological: Negative for headaches.    Objective:  BP 124/80 mmHg  Pulse 64  Temp(Src) 97.2 F (36.2 C) (Oral)  Ht 5\' 5"  (1.651 m)  Wt 377 lb (171.006 kg)  BMI 62.74 kg/m2  BP Readings from Last 3 Encounters:  07/18/15 124/80  07/13/15 140/83  05/24/15 158/93    Wt Readings from Last 3 Encounters:  07/18/15 377 lb (171.006 kg)  07/13/15 377 lb 6.4 oz (171.188 kg)  05/24/15 381 lb 3.2 oz (172.911 kg)     Physical Exam  Constitutional: He is oriented to person, place, and time. He appears well-developed and well-nourished.  HENT:  Head: Normocephalic and atraumatic.  Mouth/Throat: Oropharynx is clear and moist.  Eyes: EOM are normal. Pupils are equal, round, and reactive to light.  Neck: Normal range of motion. No tracheal deviation present. No thyromegaly present.  Cardiovascular: Normal rate, regular rhythm and normal heart sounds.  Exam reveals no gallop and no  friction rub.   No murmur heard. Pulmonary/Chest: Breath sounds normal. He has no wheezes. He has no rales.  Abdominal: Soft. He exhibits no mass. There is no tenderness.  Musculoskeletal: Normal range of motion. He exhibits no edema.  Neurological: He is alert and oriented to person, place, and time.  Skin: Skin is warm and dry.  Psychiatric: He has a normal mood and affect.    Lab Results  Component Value Date   HGBA1C  6.7 05/24/2015   HGBA1C 6.7 05/10/2014    Lab Results  Component Value Date   WBC 10.1 08/10/2013   HGB 15.3 08/10/2013   HCT 44.1 08/10/2013   PLT 227 08/10/2013   GLUCOSE 104* 05/24/2015   ALT 21 05/24/2015   AST 16 05/24/2015   NA 141 05/24/2015   K 4.6 05/24/2015   CL 99 05/24/2015   CREATININE 0.78 05/24/2015   BUN 12 05/24/2015   CO2 28 05/24/2015   HGBA1C 6.7 05/24/2015    Dg Foot Complete Left  10/25/2014  CLINICAL DATA:  Five day history of plantar region pain EXAM: LEFT FOOT - COMPLETE 3+ VIEW COMPARISON:  None. FINDINGS: Frontal, oblique, and lateral views were obtained. There is no fracture or dislocation. Joint spaces appear intact. There is a spur arising from the inferior calcaneus. IMPRESSION: Inferior calcaneal spur. No fracture or dislocation. No appreciable arthropathy. Electronically Signed   By: Lowella Grip M.D.   On: 10/25/2014 12:30    Assessment & Plan:   Diagnoses and all orders for this visit:  Screening -     POCT urinalysis dipstick  Encounter for commercial driver medical examination (CDME)   I am having [name] maintain his ibuprofen, carvedilol, furosemide, metFORMIN, amLODipine, omeprazole, lisinopril-hydrochlorothiazide, HYDROcodone-acetaminophen, aspirin EC, and diclofenac.  No orders of the defined types were placed in this encounter.     Follow-up: Return if symptoms worsen or fail to improve.  Claretta Fraise, M.D.

## 2015-07-18 NOTE — Telephone Encounter (Signed)
Patient is concerned because the pharmacist told them that Diclofenac and Metformin taken together could cause kidney failure.  Wants to know what you advise.

## 2015-07-18 NOTE — Telephone Encounter (Signed)
Patient informed and voices understanding, will take medications

## 2015-07-18 NOTE — Telephone Encounter (Signed)
They should be fine. I checked the source just be on the safe side and it shows no evidence for interaction. Additionally been prescribing them together fairly frequently for 20 years without any adverse effects in any patient. Thanks, [employer].

## 2015-08-01 ENCOUNTER — Other Ambulatory Visit: Payer: Self-pay | Admitting: Family

## 2015-08-24 ENCOUNTER — Other Ambulatory Visit: Payer: Self-pay | Admitting: Family

## 2015-08-24 MED ORDER — HYDROCODONE-ACETAMINOPHEN 5-325 MG PO TABS: 1.0000 | ORAL_TABLET | Freq: Four times a day (QID) | ORAL | Status: DC | PRN

## 2015-08-24 NOTE — Telephone Encounter (Signed)
Aware rx ready to be picked up. 

## 2015-08-24 NOTE — Telephone Encounter (Signed)
Last script for hydrocodone was q 6 hours,qty 90 on 07-13-15. Please advise on refill.

## 2015-08-24 NOTE — Telephone Encounter (Signed)
RX ready for pick up 

## 2015-09-23 ENCOUNTER — Other Ambulatory Visit: Payer: Self-pay | Admitting: Family

## 2015-09-26 ENCOUNTER — Other Ambulatory Visit: Payer: Self-pay | Admitting: Family

## 2015-09-26 NOTE — Telephone Encounter (Signed)
[facility] pt. Last filled 08/24/15, last seen 07/13/15 for regular office visit. Rx will print

## 2015-09-28 ENCOUNTER — Encounter: Payer: Self-pay | Admitting: Family

## 2015-09-28 ENCOUNTER — Ambulatory Visit (INDEPENDENT_AMBULATORY_CARE_PROVIDER_SITE_OTHER): Payer: Self-pay | Admitting: Family

## 2015-09-28 VITALS — BP 135/87 | HR 73 | Temp 97.4°F | Ht 65.0 in | Wt 385.2 lb

## 2015-09-28 DIAGNOSIS — M1990 Unspecified osteoarthritis, unspecified site: Secondary | ICD-10-CM

## 2015-09-28 DIAGNOSIS — E1165 Type 2 diabetes mellitus with hyperglycemia: Secondary | ICD-10-CM

## 2015-09-28 DIAGNOSIS — I440 Atrioventricular block, first degree: Secondary | ICD-10-CM

## 2015-09-28 DIAGNOSIS — I10 Essential (primary) hypertension: Secondary | ICD-10-CM

## 2015-09-28 MED ORDER — HYDROCODONE-ACETAMINOPHEN 5-325 MG PO TABS: 1.0000 | ORAL_TABLET | Freq: Four times a day (QID) | ORAL | Status: DC | PRN

## 2015-09-28 NOTE — Patient Instructions (Signed)

## 2015-09-28 NOTE — Progress Notes (Signed)
Subjective:    Patient ID: Ernest Martinez, male    DOB: 09-Jul-1956, 60 y.o.   MRN: [id number]  Pt presents to the office today for chronic follow up. Pt states he is getting insurance next month and plans to wait on his diabetic eye exam, colonoscopy, and ect.  Hypertension This is a chronic problem. The current episode started more than 1 year ago. The problem has been resolved since onset. The problem is controlled. Pertinent negatives include no anxiety, blurred vision, headaches, palpitations, peripheral edema or shortness of breath. Risk factors for coronary artery disease include diabetes mellitus, dyslipidemia, obesity, male gender and sedentary lifestyle. Past treatments include beta blockers, calcium channel blockers, ACE inhibitors and diuretics. The current treatment provides mild improvement. Hypertensive end-organ damage includes CAD/MI. There is no history of kidney disease, CVA, heart failure or a thyroid problem. There is no history of sleep apnea.  Diabetes He presents for his initial diabetic visit. He has type 2 diabetes mellitus. His disease course has been worsening. Pertinent negatives for hypoglycemia include no confusion or headaches. Pertinent negatives for diabetes include no blurred vision, no foot paresthesias, no foot ulcerations and no visual change. Pertinent negatives for hypoglycemia complications include no blackouts and no hospitalization. Symptoms are worsening. Pertinent negatives for diabetic complications include no CVA, heart disease, nephropathy or peripheral neuropathy. Risk factors for coronary artery disease include diabetes mellitus, dyslipidemia, male sex, hypertension, sedentary lifestyle and obesity. Current diabetic treatment includes oral agent (monotherapy). He is compliant with treatment some of the time. He is following a generally unhealthy diet. (Pt does not check blood sugars at home ) An ACE inhibitor/angiotensin II receptor blocker is being  taken. Eye exam is current.  Arthritis Presents for follow-up visit. The disease course has been fluctuating. He complains of pain and joint warmth. Affected locations include the left knee and right knee. His pain is at a severity of 7/10. Associated symptoms include pain while resting. Pertinent negatives include no dry eyes. His past medical history is significant for osteoarthritis. Past treatments include NSAIDs and rest. The treatment provided moderate relief. Factors aggravating his arthritis include climbing stairs and activity. Compliance with prior treatments has been good.      Review of Systems  Constitutional: Negative.   HENT: Negative.   Eyes: Negative for blurred vision.  Respiratory: Negative.  Negative for shortness of breath.   Cardiovascular: Negative.  Negative for palpitations.  Gastrointestinal: Negative.   Endocrine: Negative.   Genitourinary: Negative.   Musculoskeletal: Positive for arthritis.  Neurological: Negative.  Negative for headaches.  Hematological: Negative.   Psychiatric/Behavioral: Negative.  Negative for confusion.  All other systems reviewed and are negative.      Objective:   Physical Exam  Constitutional: He is oriented to person, place, and time. He appears well-developed and well-nourished. No distress.  Morbid obese   HENT:  Head: Normocephalic.  Right Ear: External ear normal.  Left Ear: External ear normal.  Nose: Nose normal.  Mouth/Throat: Oropharynx is clear and moist.  Eyes: Pupils are equal, round, and reactive to light. Right eye exhibits no discharge. Left eye exhibits no discharge.  Neck: Normal range of motion. Neck supple. No thyromegaly present.  Cardiovascular: Normal rate, regular rhythm, normal heart sounds and intact distal pulses.   No murmur heard. Pulmonary/Chest: Effort normal and breath sounds normal. No respiratory distress. He has no wheezes.  Abdominal: Soft. Bowel sounds are normal. He exhibits no  distension. There is no tenderness.  Musculoskeletal: Normal  range of motion. He exhibits no edema or tenderness.  Neurological: He is alert and oriented to person, place, and time. He has normal reflexes. No cranial nerve deficit.  Skin: Skin is warm and dry. No rash noted. No erythema.  Psychiatric: He has a normal mood and affect. His behavior is normal. Judgment and thought content normal.  Vitals reviewed.   BP 135/87 mmHg  Pulse 73  Temp(Src) 97.4 F (36.3 C) (Oral)  Ht 5\' 5"  (1.651 m)  Wt 385 lb 3.2 oz (174.726 kg)  BMI 64.10 kg/m2       Assessment & Plan:  1. Type 2 diabetes mellitus with hyperglycemia, without long-term current use of insulin ([place])  2. Morbid obesity, unspecified obesity type ([place])  3. Arthritis - HYDROcodone-acetaminophen (NORCO/VICODIN) 5-325 MG tablet; Take 1-2 tablets by mouth every 6 (six) hours as needed for moderate pain or severe pain.  Dispense: 90 tablet; Refill: 0  4. Essential hypertension  5. 1St degree AV block   Continue all meds Pt states he would rather wait for lab work until his "insurance kicks in" Health Maintenance reviewed Diet and exercise encouraged RTO 3 months  Evelina Dun, [place]

## 2015-10-30 ENCOUNTER — Other Ambulatory Visit: Payer: Self-pay | Admitting: Family

## 2015-10-30 DIAGNOSIS — M1990 Unspecified osteoarthritis, unspecified site: Secondary | ICD-10-CM

## 2015-10-31 MED ORDER — HYDROCODONE-ACETAMINOPHEN 5-325 MG PO TABS: 1.0000 | ORAL_TABLET | Freq: Four times a day (QID) | ORAL | Status: DC | PRN

## 2015-10-31 NOTE — Telephone Encounter (Signed)
RX ready for pick up 

## 2015-10-31 NOTE — Telephone Encounter (Signed)
Last seen and filled 09/28/15

## 2015-12-07 ENCOUNTER — Other Ambulatory Visit: Payer: Self-pay | Admitting: Family Medicine

## 2015-12-07 DIAGNOSIS — M1990 Unspecified osteoarthritis, unspecified site: Secondary | ICD-10-CM

## 2015-12-08 MED ORDER — HYDROCODONE-ACETAMINOPHEN 5-325 MG PO TABS: 1.0000 | ORAL_TABLET | Freq: Four times a day (QID) | ORAL | Status: DC | PRN

## 2015-12-08 NOTE — Telephone Encounter (Signed)
Patient aware rx ready to be picked up 

## 2015-12-08 NOTE — Telephone Encounter (Signed)
rx ready for pickup 

## 2015-12-08 NOTE — Telephone Encounter (Signed)
Requesting hydrocodone refill. Last filled on 2/7 for #90. Alyse Low is out of the office today.  He has a follow up appt for 12/29/15. If approved print and route to nurse to call in.

## 2015-12-21 ENCOUNTER — Other Ambulatory Visit: Payer: Self-pay | Admitting: Family

## 2015-12-29 ENCOUNTER — Ambulatory Visit: Payer: Self-pay | Admitting: Family

## 2016-01-06 ENCOUNTER — Other Ambulatory Visit: Payer: Self-pay | Admitting: Family

## 2016-01-06 ENCOUNTER — Other Ambulatory Visit: Payer: Self-pay | Admitting: Nurse Practitioner

## 2016-01-08 NOTE — Telephone Encounter (Signed)
Patient aware rx is ready to be picked up 

## 2016-01-08 NOTE — Telephone Encounter (Signed)
RX ready for pick up 

## 2016-01-08 NOTE — Telephone Encounter (Signed)
Last seen 10/08/15  Ernest Martinez  If approved print

## 2016-01-13 ENCOUNTER — Other Ambulatory Visit: Payer: Self-pay | Admitting: Family

## 2016-02-16 ENCOUNTER — Other Ambulatory Visit: Payer: Self-pay | Admitting: Family

## 2016-02-16 NOTE — Telephone Encounter (Signed)
RX ready for pick up 

## 2016-02-16 NOTE — Telephone Encounter (Signed)
Last filled 01/13/16,last seen 09/28/15

## 2016-02-21 NOTE — Telephone Encounter (Signed)
Aware, script for pain medication is ready.

## 2016-02-28 ENCOUNTER — Other Ambulatory Visit: Payer: Self-pay | Admitting: Family

## 2016-03-20 ENCOUNTER — Other Ambulatory Visit: Payer: Self-pay | Admitting: Family

## 2016-03-22 ENCOUNTER — Other Ambulatory Visit: Payer: Self-pay | Admitting: Family

## 2016-03-29 ENCOUNTER — Telehealth: Payer: Self-pay | Admitting: Family

## 2016-04-03 ENCOUNTER — Other Ambulatory Visit: Payer: Self-pay

## 2016-04-03 NOTE — Telephone Encounter (Signed)
Last seen 09/28/15 Ernest Martinez  If approved print

## 2016-04-04 MED ORDER — HYDROCODONE-ACETAMINOPHEN 5-325 MG PO TABS: 1.0000 | ORAL_TABLET | Freq: Four times a day (QID) | ORAL | Status: DC | PRN

## 2016-04-04 NOTE — Telephone Encounter (Signed)
Pt aware written Rx is at front desk ready for pickup  

## 2016-04-04 NOTE — Telephone Encounter (Signed)
RX ready for pick up 

## 2016-04-27 ENCOUNTER — Other Ambulatory Visit: Payer: Self-pay | Admitting: Family

## 2016-04-29 NOTE — Telephone Encounter (Signed)
PT needs follow up appt. Medications refilled

## 2016-05-07 ENCOUNTER — Other Ambulatory Visit: Payer: Self-pay | Admitting: Family

## 2016-05-07 DIAGNOSIS — I10 Essential (primary) hypertension: Secondary | ICD-10-CM

## 2016-05-07 NOTE — Telephone Encounter (Signed)
Patient NTBS for follow up and lab work  

## 2016-05-08 NOTE — Telephone Encounter (Signed)
Patient has no insurance at this time and has applied for Social security/disability.  He will try to schedule a follow up soon for office visit and lab work.

## 2016-05-23 ENCOUNTER — Other Ambulatory Visit: Payer: Self-pay | Admitting: Family

## 2016-05-23 DIAGNOSIS — I10 Essential (primary) hypertension: Secondary | ICD-10-CM

## 2016-05-31 ENCOUNTER — Other Ambulatory Visit: Payer: Self-pay | Admitting: Family

## 2016-05-31 ENCOUNTER — Telehealth: Payer: Self-pay | Admitting: Family

## 2016-05-31 DIAGNOSIS — I10 Essential (primary) hypertension: Secondary | ICD-10-CM

## 2016-05-31 MED ORDER — CELECOXIB 200 MG PO CAPS: ORAL_CAPSULE | ORAL | 0 refills | Status: DC

## 2016-05-31 MED ORDER — LISINOPRIL-HYDROCHLOROTHIAZIDE 20-12.5 MG PO TABS: ORAL_TABLET | ORAL | 0 refills | Status: DC

## 2016-05-31 MED ORDER — HYDROCODONE-ACETAMINOPHEN 5-325 MG PO TABS: 1.0000 | ORAL_TABLET | Freq: Four times a day (QID) | ORAL | 0 refills | Status: DC | PRN

## 2016-05-31 MED ORDER — AMLODIPINE BESYLATE 10 MG PO TABS: 10.0000 mg | ORAL_TABLET | Freq: Every morning | ORAL | 0 refills | Status: DC

## 2016-05-31 MED ORDER — METFORMIN HCL ER 500 MG PO TB24: ORAL_TABLET | ORAL | 0 refills | Status: DC

## 2016-05-31 NOTE — Telephone Encounter (Signed)
Pt aware.

## 2016-05-31 NOTE — Telephone Encounter (Signed)
Pt needs to keep appt. RX refilled. Pain medication ready for pick up

## 2016-05-31 NOTE — Telephone Encounter (Signed)
Please review and advise.

## 2016-06-05 ENCOUNTER — Other Ambulatory Visit: Payer: Self-pay | Admitting: Family

## 2016-06-10 ENCOUNTER — Ambulatory Visit (INDEPENDENT_AMBULATORY_CARE_PROVIDER_SITE_OTHER): Payer: Self-pay | Admitting: Family

## 2016-06-10 ENCOUNTER — Encounter: Payer: Self-pay | Admitting: Family

## 2016-06-10 VITALS — BP 136/83 | HR 58 | Temp 98.0°F | Ht 65.0 in | Wt 389.0 lb

## 2016-06-10 DIAGNOSIS — E1165 Type 2 diabetes mellitus with hyperglycemia: Secondary | ICD-10-CM

## 2016-06-10 DIAGNOSIS — K219 Gastro-esophageal reflux disease without esophagitis: Secondary | ICD-10-CM | POA: Insufficient documentation

## 2016-06-10 DIAGNOSIS — M1990 Unspecified osteoarthritis, unspecified site: Secondary | ICD-10-CM

## 2016-06-10 DIAGNOSIS — I10 Essential (primary) hypertension: Secondary | ICD-10-CM

## 2016-06-10 LAB — BAYER DCA HB A1C WAIVED: HB A1C (BAYER DCA - WAIVED): 7.8 % — ABNORMAL HIGH (ref <7.0)

## 2016-06-10 MED ORDER — OMEPRAZOLE 20 MG PO CPDR: DELAYED_RELEASE_CAPSULE | ORAL | 3 refills | Status: DC

## 2016-06-10 MED ORDER — METFORMIN HCL ER 500 MG PO TB24: ORAL_TABLET | ORAL | 0 refills | Status: DC

## 2016-06-10 MED ORDER — HYDROCODONE-ACETAMINOPHEN 5-325 MG PO TABS: 1.0000 | ORAL_TABLET | Freq: Four times a day (QID) | ORAL | 0 refills | Status: DC | PRN

## 2016-06-10 MED ORDER — AMLODIPINE BESYLATE 10 MG PO TABS: 10.0000 mg | ORAL_TABLET | Freq: Every morning | ORAL | 0 refills | Status: DC

## 2016-06-10 MED ORDER — CELECOXIB 200 MG PO CAPS: ORAL_CAPSULE | ORAL | 0 refills | Status: DC

## 2016-06-10 MED ORDER — TRAMADOL HCL 50 MG PO TABS: 50.0000 mg | ORAL_TABLET | Freq: Three times a day (TID) | ORAL | 2 refills | Status: DC | PRN

## 2016-06-10 MED ORDER — LISINOPRIL-HYDROCHLOROTHIAZIDE 20-12.5 MG PO TABS: ORAL_TABLET | ORAL | 0 refills | Status: DC

## 2016-06-10 MED ORDER — CARVEDILOL 12.5 MG PO TABS: 12.5000 mg | ORAL_TABLET | Freq: Two times a day (BID) | ORAL | 3 refills | Status: DC

## 2016-06-10 NOTE — Progress Notes (Signed)
Subjective:    Patient ID: Ernest Martinez, male    DOB: [birth year], 60 y.o.   MRN: [id number]  Pt presents to the office today for chronic follow up. Pt states he hopes to get disability soon and will get his diabetic eye exam, colonoscopy, and ect.  Diabetes  He presents for his initial diabetic visit. He has type 2 diabetes mellitus. His disease course has been worsening. Pertinent negatives for hypoglycemia include no confusion or headaches. Associated symptoms include fatigue. Pertinent negatives for diabetes include no blurred vision, no foot paresthesias, no foot ulcerations and no visual change. Pertinent negatives for hypoglycemia complications include no blackouts and no hospitalization. Symptoms are worsening. Pertinent negatives for diabetic complications include no CVA, heart disease, nephropathy or peripheral neuropathy. Risk factors for coronary artery disease include diabetes mellitus, dyslipidemia, male sex, hypertension, sedentary lifestyle and obesity. Current diabetic treatment includes oral agent (monotherapy). He is compliant with treatment some of the time. He is following a generally unhealthy diet. (Pt does not check blood sugars at home ) An ACE inhibitor/angiotensin II receptor blocker is being taken. Eye exam is not current.  Hypertension  This is a chronic problem. The current episode started more than 1 year ago. The problem has been resolved since onset. The problem is controlled. Pertinent negatives include no anxiety, blurred vision, headaches, palpitations, peripheral edema or shortness of breath. Risk factors for coronary artery disease include diabetes mellitus, dyslipidemia, obesity, male gender and sedentary lifestyle. Past treatments include beta blockers, calcium channel blockers, ACE inhibitors and diuretics. The current treatment provides mild improvement. Hypertensive end-organ damage includes CAD/MI. There is no history of kidney disease, CVA, heart failure  or a thyroid problem. There is no history of sleep apnea.  Arthritis  Presents for follow-up visit. The disease course has been fluctuating. He complains of pain and joint warmth. Affected locations include the left knee and right knee (back). His pain is at a severity of 10/10. Associated symptoms include fatigue, pain at night and pain while resting. Pertinent negatives include no dry eyes. His past medical history is significant for osteoarthritis. Past treatments include NSAIDs and rest. The treatment provided moderate relief. Factors aggravating his arthritis include climbing stairs and activity. Compliance with prior treatments has been good.  Gastroesophageal Reflux  He reports no coughing or no heartburn. This is a chronic problem. The current episode started more than 1 year ago. The problem occurs occasionally. Associated symptoms include fatigue. Risk factors include obesity. He has tried a PPI for the symptoms. The treatment provided moderate relief.      Review of Systems  Constitutional: Positive for fatigue.  HENT: Negative.   Eyes: Negative for blurred vision.  Respiratory: Negative.  Negative for cough and shortness of breath.   Cardiovascular: Negative.  Negative for palpitations.  Gastrointestinal: Negative.  Negative for heartburn.  Endocrine: Negative.   Genitourinary: Negative.   Musculoskeletal: Positive for arthritis.  Neurological: Negative.  Negative for headaches.  Hematological: Negative.   Psychiatric/Behavioral: Negative.  Negative for confusion.  All other systems reviewed and are negative.      Objective:   Physical Exam  Constitutional: He is oriented to person, place, and time. He appears well-developed and well-nourished. No distress.  Morbid obese   HENT:  Head: Normocephalic.  Right Ear: External ear normal.  Left Ear: External ear normal.  Nose: Nose normal.  Mouth/Throat: Oropharynx is clear and moist.  Eyes: Pupils are equal, round, and  reactive to light. Right eye exhibits  no discharge. Left eye exhibits no discharge.  Neck: Normal range of motion. Neck supple. No thyromegaly present.  Cardiovascular: Normal rate, regular rhythm, normal heart sounds and intact distal pulses.   No murmur heard. Pulmonary/Chest: Effort normal and breath sounds normal. No respiratory distress. He has no wheezes.  Abdominal: Soft. Bowel sounds are normal. He exhibits no distension. There is no tenderness.  Musculoskeletal: Normal range of motion. He exhibits no edema or tenderness.  Neurological: He is alert and oriented to person, place, and time. He has normal reflexes. No cranial nerve deficit.  Skin: Skin is warm and dry. No rash noted. No erythema.  Psychiatric: He has a normal mood and affect. His behavior is normal. Judgment and thought content normal.  Vitals reviewed.   BP 136/83   Pulse (!) 58   Temp 98 F (36.7 C) (Oral)   Ht 5' 5"  (1.651 m)   Wt (!) 389 lb (176.4 kg)   BMI 64.73 kg/m        Assessment & Plan:  1. Essential hypertension - CMP14+EGFR - Lipid panel - lisinopril-hydrochlorothiazide (PRINZIDE,ZESTORETIC) 20-12.5 MG tablet; TAKE ONE (1) TABLET EACH DAY  Dispense: 30 tablet; Refill: 0 - carvedilol (COREG) 12.5 MG tablet; Take 1 tablet (12.5 mg total) by mouth 2 (two) times daily with a meal.  Dispense: 180 tablet; Refill: 3 - amLODipine (NORVASC) 10 MG tablet; Take 1 tablet (10 mg total) by mouth every morning.  Dispense: 30 tablet; Refill: 0  2. Type 2 diabetes mellitus with hyperglycemia, without long-term current use of insulin (HCC) - Bayer DCA Hb A1c Waived - CMP14+EGFR - Lipid panel - metFORMIN (GLUCOPHAGE-XR) 500 MG 24 hr tablet; TAKE 1 TABLET EVERY MORNING WITH BREAKFAST  Dispense: 30 tablet; Refill: 0  3. Arthritis - CMP14+EGFR - celecoxib (CELEBREX) 200 MG capsule; TAKE ONE (1) CAPSULE EACH DAY  Dispense: 30 capsule; Refill: 0 - traMADol (ULTRAM) 50 MG tablet; Take 1-2 tablets (50-100 mg total)  by mouth every 8 (eight) hours as needed.  Dispense: 120 tablet; Refill: 2  4. Morbid obesity, unspecified obesity type ([place]) - CMP14+EGFR  5. Gastroesophageal reflux disease, esophagitis presence not specified - CMP14+EGFR - omeprazole (PRILOSEC) 20 MG capsule; TAKE ONE CAPSULE EACH MORNING  Dispense: 30 capsule; Refill: 3    Continue all meds Labs pending Health Maintenance reviewed- Pt refuses all until his disability is approved Diet and exercise encouraged RTO 3 months  Evelina Dun, FNP

## 2016-06-10 NOTE — Patient Instructions (Signed)

## 2016-06-11 ENCOUNTER — Other Ambulatory Visit: Payer: Self-pay | Admitting: Family

## 2016-06-11 LAB — CMP14+EGFR
ALBUMIN: 3.7 g/dL (ref 3.5–5.5)
ALT: 23 IU/L (ref 0–44)
AST: 16 IU/L (ref 0–40)
Albumin/Globulin Ratio: 1.2 (ref 1.2–2.2)
Alkaline Phosphatase: 61 IU/L (ref 39–117)
BILIRUBIN TOTAL: 0.5 mg/dL (ref 0.0–1.2)
BUN / CREAT RATIO: 14 (ref 9–20)
BUN: 12 mg/dL (ref 6–24)
CHLORIDE: 99 mmol/L (ref 96–106)
CO2: 28 mmol/L (ref 18–29)
Calcium: 9.1 mg/dL (ref 8.7–10.2)
Creatinine, Ser: 0.86 mg/dL (ref 0.76–1.27)
GFR calc non Af Amer: 95 mL/min/{1.73_m2} (ref >59)
GFR, EST AFRICAN AMERICAN: 110 mL/min/{1.73_m2} (ref >59)
GLOBULIN, TOTAL: 3 g/dL (ref 1.5–4.5)
Glucose: 202 mg/dL — ABNORMAL HIGH (ref 65–99)
Potassium: 3.9 mmol/L (ref 3.5–5.2)
SODIUM: 142 mmol/L (ref 134–144)
TOTAL PROTEIN: 6.7 g/dL (ref 6.0–8.5)

## 2016-06-11 LAB — LIPID PANEL
Chol/HDL Ratio: 3.5 ratio units (ref 0.0–5.0)
Cholesterol, Total: 168 mg/dL (ref 100–199)
HDL: 48 mg/dL (ref >39)
LDL CALC: 102 mg/dL — AB (ref 0–99)
Triglycerides: 90 mg/dL (ref 0–149)
VLDL CHOLESTEROL CAL: 18 mg/dL (ref 5–40)

## 2016-06-11 MED ORDER — LOVASTATIN 20 MG PO TABS: 20.0000 mg | ORAL_TABLET | Freq: Every day | ORAL | 1 refills | Status: DC

## 2016-06-11 MED ORDER — METFORMIN HCL 1000 MG PO TABS: 1000.0000 mg | ORAL_TABLET | Freq: Two times a day (BID) | ORAL | 3 refills | Status: DC

## 2016-06-14 LAB — BASIC METABOLIC PANEL: GLUCOSE: 101 mg/dL

## 2016-07-01 ENCOUNTER — Other Ambulatory Visit: Payer: Self-pay | Admitting: Family

## 2016-07-01 DIAGNOSIS — M1990 Unspecified osteoarthritis, unspecified site: Secondary | ICD-10-CM

## 2016-07-19 ENCOUNTER — Other Ambulatory Visit: Payer: Self-pay | Admitting: Family

## 2016-07-19 DIAGNOSIS — I10 Essential (primary) hypertension: Secondary | ICD-10-CM

## 2016-08-01 ENCOUNTER — Telehealth: Payer: Self-pay | Admitting: Family

## 2016-08-01 NOTE — Telephone Encounter (Signed)
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

## 2016-08-05 DIAGNOSIS — Z0289 Encounter for other administrative examinations: Secondary | ICD-10-CM

## 2016-08-19 ENCOUNTER — Encounter: Payer: Self-pay | Admitting: Family Medicine

## 2016-08-21 ENCOUNTER — Other Ambulatory Visit: Payer: Self-pay | Admitting: *Deleted

## 2016-08-21 DIAGNOSIS — M1990 Unspecified osteoarthritis, unspecified site: Secondary | ICD-10-CM

## 2016-08-26 MED ORDER — TRAMADOL HCL 50 MG PO TABS: 50.0000 mg | ORAL_TABLET | Freq: Three times a day (TID) | ORAL | 2 refills | Status: DC | PRN

## 2016-09-09 ENCOUNTER — Telehealth: Payer: Self-pay | Admitting: Family

## 2016-09-09 ENCOUNTER — Ambulatory Visit (INDEPENDENT_AMBULATORY_CARE_PROVIDER_SITE_OTHER): Payer: Medicaid Other | Admitting: Family

## 2016-09-09 ENCOUNTER — Encounter: Payer: Self-pay | Admitting: Family

## 2016-09-09 VITALS — BP 132/78 | HR 58 | Temp 97.4°F | Ht 65.0 in | Wt 379.6 lb

## 2016-09-09 DIAGNOSIS — E785 Hyperlipidemia, unspecified: Secondary | ICD-10-CM | POA: Diagnosis not present

## 2016-09-09 DIAGNOSIS — I10 Essential (primary) hypertension: Secondary | ICD-10-CM | POA: Diagnosis not present

## 2016-09-09 DIAGNOSIS — Z114 Encounter for screening for human immunodeficiency virus [HIV]: Secondary | ICD-10-CM

## 2016-09-09 DIAGNOSIS — Z125 Encounter for screening for malignant neoplasm of prostate: Secondary | ICD-10-CM

## 2016-09-09 DIAGNOSIS — Z1211 Encounter for screening for malignant neoplasm of colon: Secondary | ICD-10-CM

## 2016-09-09 DIAGNOSIS — E1165 Type 2 diabetes mellitus with hyperglycemia: Secondary | ICD-10-CM | POA: Diagnosis not present

## 2016-09-09 DIAGNOSIS — M1990 Unspecified osteoarthritis, unspecified site: Secondary | ICD-10-CM

## 2016-09-09 DIAGNOSIS — K219 Gastro-esophageal reflux disease without esophagitis: Secondary | ICD-10-CM | POA: Diagnosis not present

## 2016-09-09 DIAGNOSIS — Z1159 Encounter for screening for other viral diseases: Secondary | ICD-10-CM | POA: Diagnosis not present

## 2016-09-09 LAB — BAYER DCA HB A1C WAIVED: HB A1C (BAYER DCA - WAIVED): 6.7 % (ref <7.0)

## 2016-09-09 NOTE — Progress Notes (Signed)
Subjective:    Patient ID: Ernest Martinez, male    DOB: 16-Feb-1956, 60 y.o.   MRN: [id number]  Pt presents to the office today for chronic follow up. Pt states he hopes to get disability soon and will get his diabetic eye exam, colonoscopy, and ect.  Diabetes  He presents for his initial diabetic visit. He has type 2 diabetes mellitus. His disease course has been worsening. Pertinent negatives for hypoglycemia include no confusion or headaches. Associated symptoms include fatigue. Pertinent negatives for diabetes include no blurred vision, no foot paresthesias, no foot ulcerations and no visual change. Pertinent negatives for hypoglycemia complications include no blackouts and no hospitalization. Symptoms are worsening. Pertinent negatives for diabetic complications include no CVA, heart disease, nephropathy or peripheral neuropathy. Risk factors for coronary artery disease include diabetes mellitus, dyslipidemia, male sex, hypertension, sedentary lifestyle and obesity. Current diabetic treatment includes oral agent (monotherapy). He is compliant with treatment some of the time. He is following a generally unhealthy diet. (Pt does not check blood sugars at home ) An ACE inhibitor/angiotensin II receptor blocker is being taken. Eye exam is not current.  Hypertension  This is a chronic problem. The current episode started more than 1 year ago. The problem has been resolved since onset. The problem is controlled. Pertinent negatives include no anxiety, blurred vision, headaches, palpitations, peripheral edema or shortness of breath. Risk factors for coronary artery disease include diabetes mellitus, dyslipidemia, obesity, male gender and sedentary lifestyle. Past treatments include beta blockers, calcium channel blockers, ACE inhibitors and diuretics. The current treatment provides mild improvement. Hypertensive end-organ damage includes CAD/MI. There is no history of kidney disease, CVA, heart failure  or a thyroid problem. There is no history of sleep apnea.  Arthritis  Presents for follow-up visit. The disease course has been fluctuating. He complains of pain and joint warmth. Affected locations include the left knee and right knee (back). His pain is at a severity of 9/10. Associated symptoms include fatigue, pain at night and pain while resting. Pertinent negatives include no dry eyes. His past medical history is significant for osteoarthritis. Past treatments include NSAIDs and rest. The treatment provided moderate relief. Factors aggravating his arthritis include climbing stairs and activity. Compliance with prior treatments has been good.  Gastroesophageal Reflux  He reports no coughing or no heartburn. This is a chronic problem. The current episode started more than 1 year ago. The problem occurs occasionally. Associated symptoms include fatigue. Risk factors include obesity. He has tried a PPI for the symptoms. The treatment provided moderate relief.      Review of Systems  Constitutional: Positive for fatigue.  HENT: Negative.   Eyes: Negative for blurred vision.  Respiratory: Negative.  Negative for cough and shortness of breath.   Cardiovascular: Negative.  Negative for palpitations.  Gastrointestinal: Negative.  Negative for heartburn.  Endocrine: Negative.   Genitourinary: Negative.   Musculoskeletal: Positive for arthritis.  Neurological: Negative.  Negative for headaches.  Hematological: Negative.   Psychiatric/Behavioral: Negative.  Negative for confusion.  All other systems reviewed and are negative.      Objective:   Physical Exam  Constitutional: He is oriented to person, place, and time. He appears well-developed and well-nourished. No distress.  Morbid obese   HENT:  Head: Normocephalic.  Right Ear: External ear normal.  Left Ear: External ear normal.  Nose: Nose normal.  Mouth/Throat: Oropharynx is clear and moist.  Eyes: Pupils are equal, round, and  reactive to light. Right eye exhibits  no discharge. Left eye exhibits no discharge.  Neck: Normal range of motion. Neck supple. No thyromegaly present.  Cardiovascular: Normal rate, regular rhythm, normal heart sounds and intact distal pulses.   No murmur heard. Pulmonary/Chest: Effort normal and breath sounds normal. No respiratory distress. He has no wheezes.  Abdominal: Soft. Bowel sounds are normal. He exhibits no distension. There is no tenderness.  Musculoskeletal: Normal range of motion. He exhibits no edema or tenderness.  Neurological: He is alert and oriented to person, place, and time. No cranial nerve deficit.  Skin: Skin is warm and dry. No rash noted. No erythema.  Psychiatric: He has a normal mood and affect. His behavior is normal. Judgment and thought content normal.  Vitals reviewed.   BP 132/78   Pulse (!) 58   Temp 97.4 F (36.3 C) (Oral)   Ht _0  (1.651 m)   Wt (!) 379 lb 9.6 oz (172.2 kg)   BMI 63.17 kg/m   See Diabetic foot note    Assessment & Plan:  1. Essential hypertension - CMP14+EGFR  2. Gastroesophageal reflux disease, esophagitis presence not specified - CMP14+EGFR  3. Type 2 diabetes mellitus with hyperglycemia, without long-term current use of insulin ([facility]CC) - Bayer DCA Hb A1c Waived - CMP14+EGFR - Lipid panel - Microalbumin / creatinine urine ratio - Ambulatory referral to Ophthalmology  4. Arthritis - CMP14+EGFR  5. Morbid obesity ([place]) - CMP14+EGFR  6. Encounter for screening for HIV  - CMP14+EGFR - HIV antibody  7. Need for hepatitis C screening test - CMP14+EGFR - Hepatitis C antibody  8. Prostate cancer screening - CMP14+EGFR - PSA, total and free  9. Colon cancer screening - CMP14+EGFR - Fecal occult blood, imunochemical; Future  10. Hyperlipidemia, unspecified hyperlipidemia type - CMP14+EGFR - Lipid panel   Continue all meds Labs pending Health Maintenance reviewed Diet and exercise encouraged RTO 3  months   Evelina Dun, FNP

## 2016-09-09 NOTE — Patient Instructions (Signed)

## 2016-09-10 ENCOUNTER — Encounter: Payer: Self-pay | Admitting: Family Medicine

## 2016-09-10 ENCOUNTER — Ambulatory Visit (INDEPENDENT_AMBULATORY_CARE_PROVIDER_SITE_OTHER): Payer: Self-pay | Admitting: Family Medicine

## 2016-09-10 VITALS — BP 130/79 | HR 57 | Ht 67.0 in | Wt 381.0 lb

## 2016-09-10 DIAGNOSIS — Z024 Encounter for examination for driving license: Secondary | ICD-10-CM

## 2016-09-10 LAB — PSA, TOTAL AND FREE
PROSTATE SPECIFIC AG, SERUM: 0.3 ng/mL (ref 0.0–4.0)
PSA FREE PCT: 33.3 %
PSA, Free: 0.1 ng/mL

## 2016-09-10 LAB — LIPID PANEL
CHOLESTEROL TOTAL: 151 mg/dL (ref 100–199)
Chol/HDL Ratio: 3.5 ratio units (ref 0.0–5.0)
HDL: 43 mg/dL (ref >39)
LDL CALC: 86 mg/dL (ref 0–99)
TRIGLYCERIDES: 110 mg/dL (ref 0–149)
VLDL CHOLESTEROL CAL: 22 mg/dL (ref 5–40)

## 2016-09-10 LAB — CMP14+EGFR
ALBUMIN: 4.1 g/dL (ref 3.6–4.8)
ALK PHOS: 59 IU/L (ref 39–117)
ALT: 25 IU/L (ref 0–44)
AST: 17 IU/L (ref 0–40)
Albumin/Globulin Ratio: 1.4 (ref 1.2–2.2)
BILIRUBIN TOTAL: 0.4 mg/dL (ref 0.0–1.2)
BUN / CREAT RATIO: 21 (ref 10–24)
BUN: 16 mg/dL (ref 8–27)
CHLORIDE: 98 mmol/L (ref 96–106)
CO2: 28 mmol/L (ref 18–29)
CREATININE: 0.77 mg/dL (ref 0.76–1.27)
Calcium: 9.6 mg/dL (ref 8.6–10.2)
GFR calc Af Amer: 114 mL/min/{1.73_m2} (ref >59)
GFR calc non Af Amer: 99 mL/min/{1.73_m2} (ref >59)
GLUCOSE: 129 mg/dL — AB (ref 65–99)
Globulin, Total: 3 g/dL (ref 1.5–4.5)
Potassium: 4.2 mmol/L (ref 3.5–5.2)
Sodium: 144 mmol/L (ref 134–144)
Total Protein: 7.1 g/dL (ref 6.0–8.5)

## 2016-09-10 LAB — MICROALBUMIN / CREATININE URINE RATIO
Creatinine, Urine: 43.9 mg/dL
Microalb/Creat Ratio: <6.8 mg/g creat (ref 0.0–30.0)
Microalbumin, Urine: <3 ug/mL

## 2016-09-10 LAB — HEPATITIS C ANTIBODY

## 2016-09-10 LAB — HIV ANTIBODY (ROUTINE TESTING W REFLEX): HIV Screen 4th Generation wRfx: NONREACTIVE

## 2016-09-10 NOTE — Progress Notes (Signed)
Subjective:  Patient ID: Ernest Martinez, male    DOB: Feb 08, 1956  Age: 60 y.o. 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 His blood pressure has limited him to 1 year certifications. He is also obese   History Ernest Martinez has a past medical history of Abnormal EKG; Bacteremia due to Gram-negative bacteria; Bladder filling defect; Borderline diabetes; Dislocation of right shoulder joint (sept 2014); GERD (gastroesophageal reflux disease); Hydrocele of testis; Hypertension; and Morbid obesity ([place]).   He has a past surgical history that includes None and Hydrocele surgery (Right, 08/16/2013).   His family history includes Cancer in his father.He reports that he has never smoked. He has never used smokeless tobacco. He reports that he drinks alcohol. He reports that he does not use drugs.    ROS Review of Systems  Constitutional: Negative for chills, diaphoresis, fever and unexpected weight change.  HENT: Negative for congestion, hearing loss, rhinorrhea and sore throat.   Eyes: Negative for visual disturbance.  Respiratory: Negative for cough and shortness of breath.   Cardiovascular: Negative for chest pain.  Gastrointestinal: Negative for abdominal pain, constipation and diarrhea.  Genitourinary: Negative for dysuria and flank pain.  Musculoskeletal: Negative for arthralgias and joint swelling.  Skin: Negative for rash.  Neurological: Negative for dizziness and headaches.  Psychiatric/Behavioral: Negative for dysphoric mood and sleep disturbance.    Objective:  BP 130/79   Pulse (!) 57   Ht 5\' 7"  (1.702 m)   Wt (!) 381 lb (172.8 kg)   BMI 59.67 kg/m   BP Readings from Last 3 Encounters:  09/10/16 130/79  09/09/16 132/78  06/14/16 127/82    Wt Readings from Last 3 Encounters:  09/10/16 (!) 381 lb (172.8 kg)  09/09/16 (!) 379 lb 9.6 oz (172.2 kg)  06/10/16 (!) 389 lb (176.4 kg)     Physical  Exam  Constitutional: He is oriented to person, place, and time. He appears well-developed and well-nourished. No distress.  HENT:  Head: Normocephalic and atraumatic.  Right Ear: External ear normal.  Left Ear: External ear normal.  Nose: Nose normal.  Mouth/Throat: Oropharynx is clear and moist.  Eyes: Conjunctivae and EOM are normal. Pupils are equal, round, and reactive to light.  Neck: Normal range of motion. Neck supple. No thyromegaly present.  Cardiovascular: Normal rate, regular rhythm and normal heart sounds.   No murmur heard. Pulmonary/Chest: Effort normal and breath sounds normal. No respiratory distress. He has no wheezes. He has no rales.  Abdominal: Soft. Bowel sounds are normal. He exhibits no distension. There is no tenderness.  Lymphadenopathy:    He has no cervical adenopathy.  Neurological: He is alert and oriented to person, place, and time. He has normal reflexes.  Skin: Skin is warm and dry.  Psychiatric: He has a normal mood and affect. His behavior is normal. Judgment and thought content normal.     Lab Results  Component Value Date   WBC 10.1 08/10/2013   HGB 15.3 08/10/2013   HCT 44.1 08/10/2013   PLT 227 08/10/2013   GLUCOSE 129 (H) 09/09/2016   CHOL 151 09/09/2016   TRIG 110 09/09/2016   HDL 43 09/09/2016   LDLCALC 86 09/09/2016   ALT 25 09/09/2016   AST 17 09/09/2016   NA 144 09/09/2016   K 4.2 09/09/2016   CL 98 09/09/2016   CREATININE 0.77 09/09/2016   BUN 16 09/09/2016   CO2 28 09/09/2016  HGBA1C 6.7 05/24/2015   MICROALBUR 20 07/13/2015    Dg Foot Complete Left  Result Date: 10/25/2014 CLINICAL DATA:  Five day history of plantar region pain EXAM: LEFT FOOT - COMPLETE 3+ VIEW COMPARISON:  None. FINDINGS: Frontal, oblique, and lateral views were obtained. There is no fracture or dislocation. Joint spaces appear intact. There is a spur arising from the inferior calcaneus. IMPRESSION: Inferior calcaneal spur. No fracture or dislocation. No  appreciable arthropathy. Electronically Signed   By: Lowella Grip M.D.   On: 10/25/2014 12:30    Assessment & Plan:   Aleki was seen today for private dot physical.  Diagnoses and all orders for this visit:  Encounter for Department of Transportation (DOT) examination for driving license renewal    -  I am having Mr. Stickle maintain his ibuprofen, aspirin EC, diclofenac, omeprazole, lisinopril-hydrochlorothiazide, carvedilol, metFORMIN, celecoxib, furosemide, amLODipine, and traMADol.  No orders of the defined types were placed in this encounter.    Follow-up: Return in about 1 year (around 09/10/2017).  Claretta Fraise, M.D.

## 2016-09-10 NOTE — Telephone Encounter (Signed)
No answer

## 2016-09-10 NOTE — Telephone Encounter (Signed)
Yes as long as his diabetes is controlled

## 2016-09-30 ENCOUNTER — Other Ambulatory Visit: Payer: Self-pay | Admitting: Family

## 2016-09-30 DIAGNOSIS — I10 Essential (primary) hypertension: Secondary | ICD-10-CM

## 2016-09-30 DIAGNOSIS — M1990 Unspecified osteoarthritis, unspecified site: Secondary | ICD-10-CM

## 2016-10-01 LAB — HM DIABETES EYE EXAM

## 2016-11-18 ENCOUNTER — Other Ambulatory Visit: Payer: Self-pay | Admitting: Family

## 2016-11-18 DIAGNOSIS — M1990 Unspecified osteoarthritis, unspecified site: Secondary | ICD-10-CM

## 2016-11-28 ENCOUNTER — Other Ambulatory Visit: Payer: Self-pay | Admitting: Family Medicine

## 2016-11-28 ENCOUNTER — Other Ambulatory Visit: Payer: Self-pay | Admitting: Family

## 2016-11-28 DIAGNOSIS — M1990 Unspecified osteoarthritis, unspecified site: Secondary | ICD-10-CM

## 2016-11-28 DIAGNOSIS — K219 Gastro-esophageal reflux disease without esophagitis: Secondary | ICD-10-CM

## 2016-12-16 ENCOUNTER — Encounter: Payer: Self-pay | Admitting: Family

## 2016-12-16 ENCOUNTER — Ambulatory Visit (INDEPENDENT_AMBULATORY_CARE_PROVIDER_SITE_OTHER): Payer: Medicaid Other | Admitting: Family

## 2016-12-16 VITALS — BP 119/72 | HR 59 | Temp 97.1°F | Ht 67.0 in | Wt 369.8 lb

## 2016-12-16 DIAGNOSIS — Z1211 Encounter for screening for malignant neoplasm of colon: Secondary | ICD-10-CM

## 2016-12-16 DIAGNOSIS — I10 Essential (primary) hypertension: Secondary | ICD-10-CM

## 2016-12-16 DIAGNOSIS — E1165 Type 2 diabetes mellitus with hyperglycemia: Secondary | ICD-10-CM

## 2016-12-16 DIAGNOSIS — K219 Gastro-esophageal reflux disease without esophagitis: Secondary | ICD-10-CM | POA: Diagnosis not present

## 2016-12-16 DIAGNOSIS — M1990 Unspecified osteoarthritis, unspecified site: Secondary | ICD-10-CM | POA: Diagnosis not present

## 2016-12-16 LAB — CMP14+EGFR
ALK PHOS: 58 IU/L (ref 39–117)
ALT: 18 IU/L (ref 0–44)
AST: 14 IU/L (ref 0–40)
Albumin/Globulin Ratio: 1.3 (ref 1.2–2.2)
Albumin: 3.7 g/dL (ref 3.6–4.8)
BUN/Creatinine Ratio: 18 (ref 10–24)
BUN: 15 mg/dL (ref 8–27)
Bilirubin Total: 0.5 mg/dL (ref 0.0–1.2)
CALCIUM: 9.2 mg/dL (ref 8.6–10.2)
CHLORIDE: 97 mmol/L (ref 96–106)
CO2: 26 mmol/L (ref 18–29)
Creatinine, Ser: 0.85 mg/dL (ref 0.76–1.27)
GFR calc non Af Amer: 95 mL/min/{1.73_m2} (ref >59)
GFR, EST AFRICAN AMERICAN: 109 mL/min/{1.73_m2} (ref >59)
GLOBULIN, TOTAL: 2.8 g/dL (ref 1.5–4.5)
Glucose: 148 mg/dL — ABNORMAL HIGH (ref 65–99)
POTASSIUM: 3.8 mmol/L (ref 3.5–5.2)
Sodium: 141 mmol/L (ref 134–144)
Total Protein: 6.5 g/dL (ref 6.0–8.5)

## 2016-12-16 LAB — BAYER DCA HB A1C WAIVED: HB A1C: 6.3 % (ref <7.0)

## 2016-12-16 MED ORDER — CELECOXIB 200 MG PO CAPS: ORAL_CAPSULE | ORAL | 0 refills | Status: DC

## 2016-12-16 MED ORDER — TRAMADOL HCL 50 MG PO TABS: 50.0000 mg | ORAL_TABLET | Freq: Three times a day (TID) | ORAL | 2 refills | Status: DC | PRN

## 2016-12-16 NOTE — Progress Notes (Signed)
Subjective:    Patient ID: Ernest Martinez, male    DOB: 1955-11-02, 61 y.o.   MRN: [id number]  Pt presents to the office today for chronic follow up. Pt states he hopes to get disability soon and will get his diabetic eye exam, colonoscopy, and ect.  Diabetes  He presents for his initial diabetic visit. He has type 2 diabetes mellitus. His disease course has been worsening. Pertinent negatives for hypoglycemia include no confusion or headaches. Associated symptoms include fatigue. Pertinent negatives for diabetes include no blurred vision, no foot paresthesias, no foot ulcerations and no visual change. Pertinent negatives for hypoglycemia complications include no blackouts and no hospitalization. Symptoms are worsening. Diabetic complications include heart disease. Pertinent negatives for diabetic complications include no CVA, nephropathy or peripheral neuropathy. Risk factors for coronary artery disease include diabetes mellitus, dyslipidemia, male sex, hypertension, sedentary lifestyle and obesity. Current diabetic treatment includes oral agent (monotherapy). He is compliant with treatment some of the time. He is following a generally unhealthy diet. (Pt does not check blood sugars at home ) An ACE inhibitor/angiotensin II receptor blocker is being taken. Eye exam is current (09/2016).  Hypertension  This is a chronic problem. The current episode started more than 1 year ago. The problem has been resolved since onset. The problem is controlled. Pertinent negatives include no anxiety, blurred vision, headaches, palpitations, peripheral edema or shortness of breath. Risk factors for coronary artery disease include diabetes mellitus, dyslipidemia, obesity, male gender and sedentary lifestyle. Past treatments include beta blockers, calcium channel blockers, ACE inhibitors and diuretics. The current treatment provides mild improvement. Hypertensive end-organ damage includes CAD/MI. There is no history  of kidney disease, CVA or heart failure. There is no history of sleep apnea or a thyroid problem.  Arthritis  Presents for follow-up visit. The disease course has been fluctuating. He complains of pain and joint warmth. Affected locations include the left knee and right knee (back). His pain is at a severity of 10/10. Associated symptoms include fatigue, pain at night and pain while resting. Pertinent negatives include no dry eyes. His past medical history is significant for osteoarthritis. Past treatments include NSAIDs and rest. The treatment provided moderate relief. Factors aggravating his arthritis include climbing stairs and activity. Compliance with prior treatments has been good.  Gastroesophageal Reflux  He reports no coughing or no heartburn. This is a chronic problem. The current episode started more than 1 year ago. The problem occurs occasionally. Associated symptoms include fatigue. Risk factors include obesity. He has tried a PPI for the symptoms. The treatment provided moderate relief.      Review of Systems  Constitutional: Positive for fatigue.  HENT: Negative.   Eyes: Negative for blurred vision.  Respiratory: Negative.  Negative for cough and shortness of breath.   Cardiovascular: Negative.  Negative for palpitations.  Gastrointestinal: Negative.  Negative for heartburn.  Endocrine: Negative.   Genitourinary: Negative.   Musculoskeletal: Positive for arthritis.  Neurological: Negative.  Negative for headaches.  Hematological: Negative.   Psychiatric/Behavioral: Negative.  Negative for confusion.  All other systems reviewed and are negative.      Objective:   Physical Exam  Constitutional: He is oriented to person, place, and time. He appears well-developed and well-nourished. No distress.  Morbid obese   HENT:  Head: Normocephalic.  Right Ear: External ear normal.  Left Ear: External ear normal.  Nose: Nose normal.  Mouth/Throat: Oropharynx is clear and moist.    Eyes: Pupils are equal, round, and reactive  to light. Right eye exhibits no discharge. Left eye exhibits no discharge.  Neck: Normal range of motion. Neck supple. No thyromegaly present.  Cardiovascular: Normal rate, regular rhythm, normal heart sounds and intact distal pulses.   No murmur heard. Pulmonary/Chest: Effort normal and breath sounds normal. No respiratory distress. He has no wheezes.  Abdominal: Soft. Bowel sounds are normal. He exhibits no distension. There is no tenderness.  Musculoskeletal: Normal range of motion. He exhibits tenderness. He exhibits no edema.  Bilateral knee tenderness with flexion and extension  Neurological: He is alert and oriented to person, place, and time. No cranial nerve deficit.  Skin: Skin is warm and dry. No rash noted. No erythema.  Psychiatric: He has a normal mood and affect. His behavior is normal. Judgment and thought content normal.  Vitals reviewed.   BP 119/72   Pulse (!) 59   Temp 97.1 F (36.2 C) (Oral)   Ht 5' 7"  (1.702 m)   Wt (!) 369 lb 12.8 oz (167.7 kg)   BMI 57.92 kg/m      Assessment & Plan:  1. Essential hypertension - CMP14+EGFR  2. Gastroesophageal reflux disease, esophagitis presence not specified - CMP14+EGFR  3. Type 2 diabetes mellitus with hyperglycemia, without long-term current use of insulin (HCC) - CMP14+EGFR - Bayer DCA Hb A1c Waived  4. Morbid obesity ([place]) - CMP14+EGFR  5. Arthritis - CMP14+EGFR - traMADol (ULTRAM) 50 MG tablet; Take 1-2 tablets (50-100 mg total) by mouth every 8 (eight) hours as needed.  Dispense: 120 tablet; Refill: 2 - celecoxib (CELEBREX) 200 MG capsule; TAKE ONE (1) CAPSULE EACH DAY  Dispense: 90 capsule; Refill: 0  6. Colon cancer screening - Fecal occult blood, imunochemical; Future   Continue all meds Labs pending Health Maintenance reviewed Diet and exercise encouraged RTO 3 months   Evelina Dun, FNP

## 2016-12-16 NOTE — Patient Instructions (Signed)
Joint Pain Joint pain, which is also called arthralgia, can be caused by many things. Joint pain often goes away when you follow your health care provider's instructions for relieving pain at home. However, joint pain can also be caused by conditions that require further treatment. Common causes of joint pain include:  Bruising in the area of the joint.  Overuse of the joint.  Wear and tear on the joints that occur with aging (osteoarthritis).  Various other forms of arthritis.  A buildup of a crystal form of uric acid in the joint (gout).  Infections of the joint (septic arthritis) or of the bone (osteomyelitis). Your health care provider may recommend medicine to help with the pain. If your joint pain continues, additional tests may be needed to diagnose your condition. Follow these instructions at home: Watch your condition for any changes. Follow these instructions as directed to lessen the pain that you are feeling.  Take medicines only as directed by your health care provider.  Rest the affected area for as long as your health care provider says that you should. If directed to do so, raise the painful joint above the level of your heart while you are sitting or lying down.  Do not do things that cause or worsen pain.  If directed, apply ice to the painful area:  Put ice in a plastic bag.  Place a towel between your skin and the bag.  Leave the ice on for 20 minutes, 2-3 times per day.  Wear an elastic bandage, splint, or sling as directed by your health care provider. Loosen the elastic bandage or splint if your fingers or toes become numb and tingle, or if they turn cold and blue.  Begin exercising or stretching the affected area as directed by your health care provider. Ask your health care provider what types of exercise are safe for you.  Keep all follow-up visits as directed by your health care provider. This is important. Contact a health care provider if:  Your  pain increases, and medicine does not help.  Your joint pain does not improve within 3 days.  You have increased bruising or swelling.  You have a fever.  You lose 10 lb (4.5 kg) or more without trying. Get help right away if:  You are not able to move the joint.  Your fingers or toes become numb or they turn cold and blue. This information is not intended to replace advice given to you by your health care provider. Make sure you discuss any questions you have with your health care provider. Document Released: 09/09/2005 Document Revised: 02/09/2016 Document Reviewed: 06/21/2014 Elsevier Interactive Patient Education  2017 Else[employer] Inc.  

## 2016-12-17 ENCOUNTER — Other Ambulatory Visit: Payer: Self-pay | Admitting: Family

## 2016-12-17 DIAGNOSIS — I10 Essential (primary) hypertension: Secondary | ICD-10-CM

## 2016-12-30 ENCOUNTER — Ambulatory Visit (INDEPENDENT_AMBULATORY_CARE_PROVIDER_SITE_OTHER): Payer: Medicaid Other | Admitting: Family

## 2016-12-30 ENCOUNTER — Encounter: Payer: Self-pay | Admitting: Family

## 2016-12-30 VITALS — BP 128/81 | HR 58 | Temp 97.0°F | Ht 67.0 in | Wt 367.6 lb

## 2016-12-30 DIAGNOSIS — G8929 Other chronic pain: Secondary | ICD-10-CM | POA: Diagnosis not present

## 2016-12-30 DIAGNOSIS — M170 Bilateral primary osteoarthritis of knee: Secondary | ICD-10-CM | POA: Diagnosis not present

## 2016-12-30 DIAGNOSIS — M25561 Pain in right knee: Secondary | ICD-10-CM

## 2016-12-30 DIAGNOSIS — M25562 Pain in left knee: Secondary | ICD-10-CM | POA: Diagnosis not present

## 2016-12-30 MED ORDER — BUPIVACAINE HCL 0.25 % IJ SOLN
1.0000 mL | Freq: Once | INTRAMUSCULAR | Status: AC
  Administered 2016-12-30: 1 mL via INTRA_ARTICULAR

## 2016-12-30 MED ORDER — METHYLPREDNISOLONE ACETATE 40 MG/ML IJ SUSP
40.0000 mg | Freq: Once | INTRAMUSCULAR | Status: AC
  Administered 2016-12-30: 40 mg via INTRA_ARTICULAR

## 2016-12-30 MED ORDER — METHYLPREDNISOLONE ACETATE 40 MG/ML IJ SUSP
40.0000 mg | Freq: Once | INTRAMUSCULAR | Status: AC
  Administered 2016-12-30: 40 mg via INTRAMUSCULAR

## 2016-12-30 NOTE — Progress Notes (Signed)
   Subjective:    Patient ID: Ernest Martinez, male    DOB: 1956/02/04, 61 y.o.   MRN: [id number]   HPI PT presents to the office today with bilateral knee pain. Pt has arthritis and states he has  Constantly pain of 10 out 10. Pt currently taking Celebrex with mild relief. PT is morbid obese and states walking increases his pain. Pt has seen ortho in the past and was told he needed bilateral knee replacement, but had to lose weight first.    Review of Systems  Musculoskeletal: Positive for arthralgias.  All other systems reviewed and are negative.      Objective:   Physical Exam  Constitutional: He is oriented to person, place, and time. He appears well-developed and well-nourished. No distress.  HENT:  Head: Normocephalic.  Neck: No thyromegaly present.  Cardiovascular: Normal rate, regular rhythm, normal heart sounds and intact distal pulses.   No murmur heard. Pulmonary/Chest: Effort normal and breath sounds normal. No respiratory distress. He has no wheezes.  Musculoskeletal: Normal range of motion. He exhibits tenderness (present in bilateral knee with flexion and extension). He exhibits no edema.  Neurological: He is alert and oriented to person, place, and time.  Skin: Skin is warm and dry. No rash noted. No erythema.  Psychiatric: He has a normal mood and affect. His behavior is normal. Judgment and thought content normal.  Vitals reviewed.  rightknee prepped with betadine Injected with Marcaine .5% plain and methylprednisolone with 22 guage needle x 1. Patient tolerated well.  leftknee prepped with betadine Injected with Marcaine .5% plain and methylprednisolone with 22 guage needle x 1. Patient tolerated well.    BP 128/81   Pulse (!) 58   Temp 97 F (36.1 C) (Oral)   Ht 5\' 7"  (1.702 m)   Wt (!) 367 lb 9.6 oz (166.7 kg)   BMI 57.57 kg/m       Assessment & Plan:  1. Chronic pain of both knees - methylPREDNISolone acetate (DEPO-MEDROL) injection 40 mg;  Inject 1 mL (40 mg total) into the articular space once. - bupivacaine (MARCAINE) 0.25 % (with pres) injection 1 mL; Inject 1 mL into the articular space once. - methylPREDNISolone acetate (DEPO-MEDROL) injection 40 mg; Inject 1 mL (40 mg total) into the muscle once. - bupivacaine (MARCAINE) 0.25 % (with pres) injection 1 mL; Inject 1 mL into the articular space once.  2. Primary osteoarthritis of both knees - methylPREDNISolone acetate (DEPO-MEDROL) injection 40 mg; Inject 1 mL (40 mg total) into the articular space once. - bupivacaine (MARCAINE) 0.25 % (with pres) injection 1 mL; Inject 1 mL into the articular space once. - methylPREDNISolone acetate (DEPO-MEDROL) injection 40 mg; Inject 1 mL (40 mg total) into the muscle once. - bupivacaine (MARCAINE) 0.25 % (with pres) injection 1 mL; Inject 1 mL into the articular space once.  Rest Ice and heat Encouraged to lose weight ROM exercises encouraged RTO prn    Evelina Dun, FNP

## 2016-12-30 NOTE — Patient Instructions (Addendum)
Osteoarthritis  Osteoarthritis is a type of arthritis that affects tissue that covers the ends of bones in joints (cartilage). Cartilage acts as a cushion between the bones and helps them move smoothly. Osteoarthritis results when cartilage in the joints gets worn down. Osteoarthritis is sometimes called "wear and tear" arthritis.  Osteoarthritis is the most common form of arthritis. It often occurs in older people. It is a condition that gets worse over time (a progressive condition). Joints that are most often affected by this condition are in:  · Fingers.  · Toes.  · Hips.  · Knees.  · Spine, including neck and lower back.    What are the causes?  This condition is caused by age-related wearing down of cartilage that covers the ends of bones.  What increases the risk?  The following factors may make you more likely to develop this condition:  · Older age.  · Being overweight or obese.  · Overuse of joints, such as in athletes.  · Past injury of a joint.  · Past surgery on a joint.  · Family history of osteoarthritis.    What are the signs or symptoms?  The main symptoms of this condition are pain, swelling, and stiffness in the joint. The joint may lose its shape over time. Small pieces of bone or cartilage may break off and float inside of the joint, which may cause more pain and damage to the joint. Small deposits of bone (osteophytes) may grow on the edges of the joint. Other symptoms may include:  · A grating or scraping feeling inside the joint when you move it.  · Popping or creaking sounds when you move.    Symptoms may affect one or more joints. Osteoarthritis in a major joint, such as your knee or hip, can make it painful to walk or exercise. If you have osteoarthritis in your hands, you might not be able to grip items, twist your hand, or control small movements of your hands and fingers (fine motor skills).  How is this diagnosed?  This condition may be diagnosed based on:  · Your medical history.  · A  physical exam.  · Your symptoms.  · X-rays of the affected joint(s).  · Blood tests to rule out other types of arthritis.    How is this treated?  There is no cure for this condition, but treatment can help to control pain and improve joint function. Treatment plans may include:  · A prescribed exercise program that allows for rest and joint relief. You may work with a physical therapist.  · A weight control plan.  · Pain relief techniques, such as:  ? Applying heat and cold to the joint.  ? Electric pulses delivered to nerve endings under the skin (transcutaneous electrical nerve stimulation, or TENS).  ? Massage.  ? Certain nutritional supplements.  · NSAIDs or prescription medicines to help relieve pain.  · Medicine to help relieve pain and inflammation (corticosteroids). This can be given by mouth (orally) or as an injection.  · Assistive devices, such as a brace, wrap, splint, specialized glove, or cane.  · Surgery, such as:  ? An osteotomy. This is done to reposition the bones and relieve pain or to remove loose pieces of bone and cartilage.  ? Joint replacement surgery. You may need this surgery if you have very bad (advanced) osteoarthritis.    Follow these instructions at home:  Activity   · Rest your affected joints as directed by your   health care provider.  · Do not drive or use heavy machinery while taking prescription pain medicine.  · Exercise as directed. Your health care provider or physical therapist may recommend specific types of exercise, such as:  ? Strengthening exercises. These are done to strengthen the muscles that support joints that are affected by arthritis. They can be performed with weights or with exercise bands to add resistance.  ? Aerobic activities. These are exercises, such as brisk walking or water aerobics, that get your heart pumping.  ? Range-of-motion activities. These keep your joints easy to move.  ? Balance and agility exercises.  Managing pain, stiffness, and swelling    · If directed, apply heat to the affected area as often as told by your health care provider. Use the heat source that your health care provider recommends, such as a moist heat pack or a heating pad.  ? If you have a removable assistive device, remove it as told by your health care provider.  ? Place a towel between your skin and the heat source. If your health care provider tells you to keep the assistive device on while you apply heat, place a towel between the assistive device and the heat source.  ? Leave the heat on for 20-30 minutes.  ? Remove the heat if your skin turns bright red. This is especially important if you are unable to feel pain, heat, or cold. You may have a greater risk of getting burned.  · If directed, put ice on the affected joint:  ? If you have a removable assistive device, remove it as told by your health care provider.  ? Put ice in a plastic bag.  ? Place a towel between your skin and the bag. If your health care provider tells you to keep the assistive device on during icing, place a towel between the assistive device and the bag.  ? Leave the ice on for 20 minutes, 2-3 times a day.  General instructions   · Take over-the-counter and prescription medicines only as told by your health care provider.  · Maintain a healthy weight. Follow instructions from your health care provider for weight control. These may include dietary restrictions.  · Do not use any products that contain nicotine or tobacco, such as cigarettes and e-cigarettes. These can delay bone healing. If you need help quitting, ask your health care provider.  · Use assistive devices as directed by your health care provider.  · Keep all follow-up visits as told by your health care provider. This is important.  Where to find more information:  · National Institute of Arthritis and Musculoskeletal and Skin Diseases: www.niams.nih.gov  · National Institute on Aging: www.nia.nih.gov  · American College of Rheumatology:  www.rheumatology.org  Contact a health care provider if:  · Your skin turns red.  · You develop a rash.  · You have pain that gets worse.  · You have a fever along with joint or muscle aches.  Get help right away if:  · You lose a lot of weight.  · You suddenly lose your appetite.  · You have night sweats.  Summary  · Osteoarthritis is a type of arthritis that affects tissue covering the ends of bones in joints (cartilage).  · This condition is caused by age-related wearing down of cartilage that covers the ends of bones.  · The main symptom of this condition is pain, swelling, and stiffness in the joint.  · There is no cure for this   condition, but treatment can help to control pain and improve joint function.  This information is not intended to replace advice given to you by your health care provider. Make sure you discuss any questions you have with your health care provider.  Document Released: 09/09/2005 Document Revised: 05/13/2016 Document Reviewed: 05/13/2016  Elsevier Interactive Patient Education © 2017 [employer]

## 2017-01-16 ENCOUNTER — Other Ambulatory Visit: Payer: Self-pay | Admitting: Family

## 2017-01-29 ENCOUNTER — Other Ambulatory Visit: Payer: Self-pay | Admitting: Family

## 2017-01-29 DIAGNOSIS — M1990 Unspecified osteoarthritis, unspecified site: Secondary | ICD-10-CM

## 2017-02-14 ENCOUNTER — Other Ambulatory Visit: Payer: Self-pay | Admitting: Family Medicine

## 2017-02-14 ENCOUNTER — Other Ambulatory Visit: Payer: Self-pay | Admitting: Family

## 2017-02-14 DIAGNOSIS — K219 Gastro-esophageal reflux disease without esophagitis: Secondary | ICD-10-CM

## 2017-02-14 DIAGNOSIS — I10 Essential (primary) hypertension: Secondary | ICD-10-CM

## 2017-02-14 DIAGNOSIS — M1990 Unspecified osteoarthritis, unspecified site: Secondary | ICD-10-CM

## 2017-02-18 ENCOUNTER — Other Ambulatory Visit: Payer: Self-pay

## 2017-02-18 DIAGNOSIS — I10 Essential (primary) hypertension: Secondary | ICD-10-CM

## 2017-02-18 MED ORDER — METFORMIN HCL 1000 MG PO TABS: 1000.0000 mg | ORAL_TABLET | Freq: Two times a day (BID) | ORAL | 0 refills | Status: DC

## 2017-02-18 MED ORDER — AMLODIPINE BESYLATE 10 MG PO TABS: 10.0000 mg | ORAL_TABLET | Freq: Every morning | ORAL | 4 refills | Status: DC

## 2017-03-06 ENCOUNTER — Emergency Department (HOSPITAL_COMMUNITY): Payer: Medicaid Other

## 2017-03-06 ENCOUNTER — Emergency Department (HOSPITAL_COMMUNITY)
Admission: EM | Admit: 2017-03-06 | Discharge: 2017-03-06 | Disposition: A | Discharge status: Home / Self Care | Payer: Medicaid Other | Attending: Emergency Medicine | Admitting: Emergency Medicine

## 2017-03-06 ENCOUNTER — Encounter (HOSPITAL_COMMUNITY): Payer: Self-pay | Admitting: Emergency Medicine

## 2017-03-06 DIAGNOSIS — M545 Low back pain, unspecified: Secondary | ICD-10-CM

## 2017-03-06 DIAGNOSIS — E119 Type 2 diabetes mellitus without complications: Secondary | ICD-10-CM | POA: Insufficient documentation

## 2017-03-06 DIAGNOSIS — Z7982 Long term (current) use of aspirin: Secondary | ICD-10-CM | POA: Diagnosis not present

## 2017-03-06 DIAGNOSIS — Z7984 Long term (current) use of oral hypoglycemic drugs: Secondary | ICD-10-CM | POA: Diagnosis not present

## 2017-03-06 DIAGNOSIS — M5137 Other intervertebral disc degeneration, lumbosacral region: Secondary | ICD-10-CM | POA: Diagnosis not present

## 2017-03-06 DIAGNOSIS — I10 Essential (primary) hypertension: Secondary | ICD-10-CM | POA: Insufficient documentation

## 2017-03-06 MED ORDER — METHOCARBAMOL 500 MG PO TABS: 1000.0000 mg | ORAL_TABLET | Freq: Four times a day (QID) | ORAL | 0 refills | Status: AC

## 2017-03-06 NOTE — ED Provider Notes (Signed)
[place] DEPT Provider Note   CSN: [id number] Arrival date & time: 03/06/17  1145     History   Chief Complaint Chief Complaint  Patient presents with  . Back Pain    HPI Ernest Martinez is a 61 y.o. male presenting with acute on chronic low back pain which has which has been present for the past several days.   Patient denies any new injury specifically, reports he simply sat down on a stool and felt a sudden popping sensation in his left lower back.  There is no radiation of pain into his legs, no weakness or numbness in the lower extremities and no urinary or bowel retention or incontinence.  Patient does not have a history of cancer or IVDU.  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 .  The history is provided by the patient.    Past Medical History:  Diagnosis Date  . Abnormal EKG   . Bacteremia due to Gram-negative bacteria    a. 03/2013: klebsiella UTI with bacteremia.  . Bladder filling defect    a. 03/2013: Filling defect at base of bladder by CT pelvis, for f/u urology.  . Borderline diabetes   . Dislocation of right shoulder joint sept 2014   after fall  . GERD (gastroesophageal reflux disease)   . Hydrocele of testis    a. 03/2013: US showing Large right testicular hydrocele and small left hydrocele.  Marland Kitchen Hypertension   . Morbid obesity [facility])     Patient Active Problem List   Diagnosis Date Noted  . GERD (gastroesophageal reflux disease) 06/10/2016  . Arthritis 07/13/2015  . Diabetes mellitus, type 2 ([place]) 05/24/2015  . Morbid obesity ([place]) 03/29/2013  . Hydrocele, right 03/29/2013  . Chest pain 03/28/2013  . 1st degree AV block 10/28/2012  . IVCD (intraventricular conduction defect) 10/28/2012  . HTN (hypertension) 10/28/2012    Past Surgical History:  Procedure Laterality Date  . HYDROCELE EXCISION Right 08/16/2013   Procedure: HYDROCELECTOMY ADULT  Procedure: Right Scrotal  Hydrocele Repair;  Surgeon: Bernestine Amass, MD;  Location: [facility] ORS;  Service: Urology;  Laterality: Right;  . None         Home Medications    Prior to Admission medications   Medication Sig Start Date End Date Taking? Authorizing Provider  amLODipine (NORVASC) 10 MG tablet Take 1 tablet (10 mg total) by mouth every morning. 02/18/17   Evelina Dun A, FNP  aspirin EC 81 MG tablet Take 1 tablet (81 mg total) by mouth daily. 07/13/15   Sharion Balloon, FNP  carvedilol (COREG) 12.5 MG tablet Take 1 tablet (12.5 mg total) by mouth 2 (two) times daily with a meal. 06/10/16   Hawks, Alyse Low A, FNP  celecoxib (CELEBREX) 200 MG capsule TAKE ONE (1) CAPSULE EACH DAY 01/30/17   Hawks, Christy A, FNP  diclofenac (VOLTAREN) 75 MG EC tablet Take 1 tablet (75 mg total) by mouth 2 (two) times daily. 07/13/15   Sharion Balloon, FNP  furosemide (LASIX) 20 MG tablet TAKE ONE TABLET EVERY MORNING 07/19/16   Evelina Dun A, FNP  furosemide (LASIX) 20 MG tablet TAKE ONE TABLET EVERY MORNING 01/16/17   Evelina Dun A, FNP  ibuprofen (ADVIL) 200 MG tablet Take 600 mg by mouth daily as needed.    [provider]  lisinopril-hydrochlorothiazide (PRINZIDE,ZESTORETIC) 20-12.5 MG tablet TAKE ONE (1) TABLET EACH DAY 02/14/17   Sharion Balloon, FNP  metFORMIN (GLUCOPHAGE) 1000 MG tablet Take 1 tablet (1,000 mg total) by mouth 2 (two) times daily with a meal. 02/18/17   Hawks, Christy A, FNP  methocarbamol (ROBAXIN) 500 MG tablet Take 2 tablets (1,000 mg total) by mouth 4 (four) times daily. 03/06/17 03/16/17  Evalee Jefferson, PA-C  omeprazole (PRILOSEC) 20 MG capsule TAKE ONE CAPSULE EACH MORNING 02/14/17   Evelina Dun A, FNP  traMADol (ULTRAM) 50 MG tablet Take 1-2 tablets (50-100 mg total) by mouth every 8 (eight) hours as needed. 12/16/16   Sharion Balloon, FNP    Family History Family History  Problem Relation Age of Onset  . Cancer Father        prostate cancer  . CAD Neg Hx     Social  History Social History  Substance Use Topics  . Smoking status: Never Smoker  . Smokeless tobacco: Never Used  . Alcohol use Yes     Comment: socially     Allergies   Lovastatin and Metoprolol tartrate   Review of Systems Review of Systems  Constitutional: Negative for fever.  Respiratory: Negative for shortness of breath.   Cardiovascular: Negative for chest pain and leg swelling.  Gastrointestinal: Negative for abdominal distention, abdominal pain and constipation.  Genitourinary: Negative for difficulty urinating, dysuria, flank pain, frequency and urgency.  Musculoskeletal: Positive for back pain. Negative for gait problem and joint swelling.  Skin: Negative for rash.  Neurological: Negative for weakness and numbness.     Physical Exam Updated Vital Signs BP 120/73 (BP Location: Left Arm)   Pulse 61   Temp 98.5 F (36.9 C) (Oral)   Resp 14   Ht 5\' 6"  (1.676 m)   Wt (!) 158.8 kg (350 lb)   SpO2 100%   BMI 56.49 kg/m   Physical Exam  Constitutional: He appears well-developed and well-nourished.  HENT:  Head: Normocephalic.  Neck: Normal range of motion.  Cardiovascular: Normal rate and intact distal pulses.   Pedal pulses normal.  Pulmonary/Chest: Effort normal.  Abdominal: Soft. He exhibits no mass. There is no tenderness.  Musculoskeletal: Normal range of motion. He exhibits no edema.       Lumbar back: He exhibits tenderness. He exhibits no swelling, no edema and no spasm.  ttp left paralumbar musculature.  No midline tenderness.  Neurological: He is alert. He has normal strength. He displays no atrophy and no tremor. No sensory deficit. Gait normal.  Reflex Scores:      Patellar reflexes are 2+ on the right side and 2+ on the left side.      Achilles reflexes are 2+ on the right side and 2+ on the left side. No strength deficit noted in hip and knee flexor and extensor muscle groups.  Ankle flexion and extension intact.  Skin: Skin is warm and dry.   Psychiatric: He has a normal mood and affect.  Nursing note and vitals reviewed.    ED Treatments / Results  Labs (all labs ordered are listed, but only abnormal results are displayed) Labs Reviewed - No data to display  EKG  EKG Interpretation None       Radiology Dg Lumbar Spine Complete  Result Date: 03/06/2017 CLINICAL DATA:  Lower back pain after hearing a pop when sitting down a few days ago. Left-sided back pain ever since. EXAM: LUMBAR SPINE - COMPLETE 4+ VIEW COMPARISON:  03/30/2013 CT abdomen and pelvis FINDINGS: Five non ribbed lumbar vertebrae are noted. Normal lumbar alignment. No spondylolysis nor spondylolisthesis. Facet arthropathy  at L5-S1 with disc space narrowing similar to CT findings from 2014. No acute fracture nor suspicious osseous lesions. Small anterior osteophyte off the superior endplate of L5. IMPRESSION: 1. Degenerative disc and facet arthropathy L5-S1. 2. No acute fracture nor bone destruction. Electronically Signed   By: Ashley Royalty M.D.   On: 03/06/2017 13:40    Procedures Procedures (including critical care time)  Medications Ordered in ED Medications - No data to display   Initial Impression / Assessment and Plan / ED Course  I have reviewed the triage vital signs and the nursing notes.  Pertinent labs & imaging results that were available during my care of the patient were reviewed by me and considered in my medical decision making (see chart for details).     No neuro deficit on exam or by history to suggest emergent or surgical presentation.  Discussed worsened sx that should prompt immediate re-evaluation including distal weakness, bowel/bladder retention/incontinence.        Final Clinical Impressions(s) / ED Diagnoses   Final diagnoses:  Acute left-sided low back pain without sciatica  DDD (degenerative disc disease), lumbosacral    New Prescriptions Discharge Medication List as of 03/06/2017  2:25 PM    START taking  these medications   Details  methocarbamol (ROBAXIN) 500 MG tablet Take 2 tablets (1,000 mg total) by mouth 4 (four) times daily., Starting Thu 03/06/2017, Until Sun 03/16/2017, Print         Evalee Jefferson, PA-C 03/06/17 1700    Davonna Belling, MD 03/08/17 2029

## 2017-03-06 NOTE — ED Notes (Signed)
Was visiting mother at [place] and when to sit in chair and heard something pop.  Currently rates pain 3/10 but pain increases to 9/10 with movement.

## 2017-03-06 NOTE — Discharge Instructions (Signed)
Avoid lifting,  Bending,  Twisting or any other activity that worsens your pain over the next week.  Apply an  icepack  to your lower back for 10-15 minutes every 2 hours for the next 2 days.  You should get rechecked if your symptoms are not better over the next 5 days,  Or you develop increased pain,  Weakness in your leg(s) or loss of bladder or bowel function - these are symptoms of a worsening problem. Continue using your celebrex and your tramadol.  Apply a heating pad to your lower back 20 minutes several times daily.

## 2017-03-06 NOTE — ED Triage Notes (Signed)
Pt reports went to sit down a few days ago and reports felt a "pop" in lower back . Pt reports left sided back pain ever since. nad noted. Pt denies gu/gi symptoms.

## 2017-03-14 ENCOUNTER — Encounter: Payer: Self-pay | Admitting: Family

## 2017-03-14 ENCOUNTER — Ambulatory Visit (INDEPENDENT_AMBULATORY_CARE_PROVIDER_SITE_OTHER): Payer: Medicaid Other | Admitting: Family

## 2017-03-14 VITALS — BP 122/83 | HR 62 | Temp 97.5°F | Ht 66.0 in | Wt 360.2 lb

## 2017-03-14 DIAGNOSIS — E1165 Type 2 diabetes mellitus with hyperglycemia: Secondary | ICD-10-CM

## 2017-03-14 DIAGNOSIS — M1990 Unspecified osteoarthritis, unspecified site: Secondary | ICD-10-CM | POA: Diagnosis not present

## 2017-03-14 DIAGNOSIS — I10 Essential (primary) hypertension: Secondary | ICD-10-CM | POA: Diagnosis not present

## 2017-03-14 DIAGNOSIS — K219 Gastro-esophageal reflux disease without esophagitis: Secondary | ICD-10-CM | POA: Diagnosis not present

## 2017-03-14 LAB — BAYER DCA HB A1C WAIVED: HB A1C (BAYER DCA - WAIVED): 6.1 % (ref <7.0)

## 2017-03-14 MED ORDER — TRAMADOL HCL 50 MG PO TABS: 50.0000 mg | ORAL_TABLET | Freq: Three times a day (TID) | ORAL | 2 refills | Status: DC | PRN

## 2017-03-14 NOTE — Progress Notes (Signed)
Subjective:    Patient ID: Ernest Martinez, male    DOB: January 04, 1956, 61 y.o.   MRN: [id number]  Pt presents to the office today for chronic follow up. Diabetes  He presents for his follow-up diabetic visit. He has type 2 diabetes mellitus. There are no hypoglycemic associated symptoms. Pertinent negatives for diabetes include no blurred vision, no foot paresthesias, no foot ulcerations and no visual change. Symptoms are stable. Pertinent negatives for diabetic complications include no CVA. (Does not check at home ) Eye exam is current (01/18).  Hypertension  This is a chronic problem. The current episode started more than 1 year ago. The problem has been resolved since onset. The problem is controlled. Pertinent negatives include no blurred vision, peripheral edema or shortness of breath. Risk factors for coronary artery disease include diabetes mellitus, obesity, male gender and sedentary lifestyle. Hypertensive end-organ damage includes CAD/MI. There is no history of CVA.  Gastroesophageal Reflux  He reports no belching or no heartburn. This is a chronic problem. The current episode started more than 1 year ago. The problem occurs occasionally. The problem has been waxing and waning. He has tried a PPI for the symptoms. The treatment provided moderate relief.  Arthritis  Presents for follow-up visit. He complains of pain and stiffness. The symptoms have been worsening. Affected locations include the right knee and left knee. His pain is at a severity of 6/10.      Review of Systems  Eyes: Negative for blurred vision.  Respiratory: Negative for shortness of breath.   Gastrointestinal: Negative for heartburn.  Musculoskeletal: Positive for arthritis and stiffness.  All other systems reviewed and are negative.      Objective:   Physical Exam  Constitutional: He is oriented to person, place, and time. He appears well-developed and well-nourished. No distress.  HENT:  Head: Normocephalic.   Right Ear: External ear normal.  Left Ear: External ear normal.  Nose: Nose normal.  Mouth/Throat: Oropharynx is clear and moist.  Eyes: Pupils are equal, round, and reactive to light. Right eye exhibits no discharge. Left eye exhibits no discharge.  Neck: Normal range of motion. Neck supple. No thyromegaly present.  Cardiovascular: Normal rate, regular rhythm, normal heart sounds and intact distal pulses.   No murmur heard. Pulmonary/Chest: Effort normal and breath sounds normal. No respiratory distress. He has no wheezes.  Abdominal: Soft. Bowel sounds are normal. He exhibits no distension. There is no tenderness.  Musculoskeletal: Normal range of motion. He exhibits no edema or tenderness.  Neurological: He is alert and oriented to person, place, and time.  Skin: Skin is warm and dry. No rash noted. No erythema.  Psychiatric: He has a normal mood and affect. His behavior is normal. Judgment and thought content normal.  Vitals reviewed.     BP 122/83   Pulse 62   Temp 97.5 F (36.4 C) (Oral)   Ht _0  (1.676 m)   Wt (!) 360 lb 3.2 oz (163.4 kg)   BMI 58.14 kg/m      Assessment & Plan:  1. Essential hypertensio - CMP14+EGFR  2. Gastroesophageal reflux disease, esophagitis presence not specified - CMP14+EGFR  3. Type 2 diabetes mellitus with hyperglycemia, without long-term current use of insulin (HCC) - CMP14+EGFR - Lipid panel - Bayer DCA Hb A1c Waived  4. Arthritis - CMP14+EGFR  5. Morbid obesity ([place]) - CMP14+EGFR  PT reviewed in [place] controlled database- Pt only has received controlled medications    Continue all meds Labs  pending Health Maintenance reviewed Diet and exercise encouraged RTO 4 months   Evelina Dun, FNP

## 2017-03-14 NOTE — Patient Instructions (Signed)

## 2017-03-15 LAB — CMP14+EGFR
ALK PHOS: 53 IU/L (ref 39–117)
ALT: 23 IU/L (ref 0–44)
AST: 16 IU/L (ref 0–40)
Albumin/Globulin Ratio: 1.5 (ref 1.2–2.2)
Albumin: 3.8 g/dL (ref 3.6–4.8)
BUN/Creatinine Ratio: 17 (ref 10–24)
BUN: 14 mg/dL (ref 8–27)
Bilirubin Total: 0.4 mg/dL (ref 0.0–1.2)
CALCIUM: 9.3 mg/dL (ref 8.6–10.2)
CO2: 28 mmol/L (ref 20–29)
CREATININE: 0.84 mg/dL (ref 0.76–1.27)
Chloride: 101 mmol/L (ref 96–106)
GFR calc Af Amer: 110 mL/min/{1.73_m2} (ref >59)
GFR calc non Af Amer: 95 mL/min/{1.73_m2} (ref >59)
GLOBULIN, TOTAL: 2.6 g/dL (ref 1.5–4.5)
GLUCOSE: 131 mg/dL — AB (ref 65–99)
Potassium: 3.6 mmol/L (ref 3.5–5.2)
Sodium: 144 mmol/L (ref 134–144)
Total Protein: 6.4 g/dL (ref 6.0–8.5)

## 2017-03-15 LAB — LIPID PANEL
CHOL/HDL RATIO: 3.2 ratio (ref 0.0–5.0)
CHOLESTEROL TOTAL: 134 mg/dL (ref 100–199)
HDL: 42 mg/dL (ref >39)
LDL Calculated: 79 mg/dL (ref 0–99)
TRIGLYCERIDES: 63 mg/dL (ref 0–149)
VLDL CHOLESTEROL CAL: 13 mg/dL (ref 5–40)

## 2017-03-21 ENCOUNTER — Ambulatory Visit: Payer: Medicaid Other | Admitting: Family

## 2017-05-15 ENCOUNTER — Other Ambulatory Visit: Payer: Self-pay | Admitting: Family

## 2017-05-15 DIAGNOSIS — I10 Essential (primary) hypertension: Secondary | ICD-10-CM

## 2017-05-15 DIAGNOSIS — M1990 Unspecified osteoarthritis, unspecified site: Secondary | ICD-10-CM

## 2017-05-16 NOTE — Telephone Encounter (Signed)
Phoned in.

## 2017-05-27 ENCOUNTER — Other Ambulatory Visit: Payer: Self-pay | Admitting: Family

## 2017-05-27 DIAGNOSIS — K219 Gastro-esophageal reflux disease without esophagitis: Secondary | ICD-10-CM

## 2017-05-27 DIAGNOSIS — I10 Essential (primary) hypertension: Secondary | ICD-10-CM

## 2017-06-13 ENCOUNTER — Encounter: Payer: Self-pay | Admitting: Family

## 2017-06-13 ENCOUNTER — Ambulatory Visit (INDEPENDENT_AMBULATORY_CARE_PROVIDER_SITE_OTHER): Payer: Medicare Other | Admitting: Family

## 2017-06-13 VITALS — BP 118/82 | HR 78 | Temp 98.0°F | Ht 66.0 in | Wt 349.8 lb

## 2017-06-13 DIAGNOSIS — E1165 Type 2 diabetes mellitus with hyperglycemia: Secondary | ICD-10-CM | POA: Diagnosis not present

## 2017-06-13 DIAGNOSIS — K219 Gastro-esophageal reflux disease without esophagitis: Secondary | ICD-10-CM | POA: Diagnosis not present

## 2017-06-13 DIAGNOSIS — I10 Essential (primary) hypertension: Secondary | ICD-10-CM | POA: Diagnosis not present

## 2017-06-13 DIAGNOSIS — M1990 Unspecified osteoarthritis, unspecified site: Secondary | ICD-10-CM

## 2017-06-13 LAB — CMP14+EGFR
ALBUMIN: 3.9 g/dL (ref 3.6–4.8)
ALK PHOS: 50 IU/L (ref 39–117)
ALT: 22 IU/L (ref 0–44)
AST: 23 IU/L (ref 0–40)
Albumin/Globulin Ratio: 1.4 (ref 1.2–2.2)
BUN / CREAT RATIO: 13 (ref 10–24)
BUN: 10 mg/dL (ref 8–27)
Bilirubin Total: 0.3 mg/dL (ref 0.0–1.2)
CO2: 26 mmol/L (ref 20–29)
Calcium: 9.3 mg/dL (ref 8.6–10.2)
Chloride: 101 mmol/L (ref 96–106)
Creatinine, Ser: 0.75 mg/dL — ABNORMAL LOW (ref 0.76–1.27)
GFR calc Af Amer: 115 mL/min/{1.73_m2} (ref >59)
GFR, EST NON AFRICAN AMERICAN: 100 mL/min/{1.73_m2} (ref >59)
GLOBULIN, TOTAL: 2.7 g/dL (ref 1.5–4.5)
GLUCOSE: 120 mg/dL — AB (ref 65–99)
POTASSIUM: 3.7 mmol/L (ref 3.5–5.2)
Sodium: 143 mmol/L (ref 134–144)
TOTAL PROTEIN: 6.6 g/dL (ref 6.0–8.5)

## 2017-06-13 LAB — BAYER DCA HB A1C WAIVED: HB A1C (BAYER DCA - WAIVED): 6 % (ref <7.0)

## 2017-06-13 MED ORDER — TRAMADOL HCL 50 MG PO TABS: ORAL_TABLET | ORAL | 2 refills | Status: DC

## 2017-06-13 NOTE — Patient Instructions (Signed)

## 2017-06-13 NOTE — Progress Notes (Signed)
Subjective:    Patient ID: Ernest Martinez, male    DOB: 07/18/1956, 61 y.o.   MRN: [id number]  Pt presents to the office today for chronic follow up. Diabetes  He presents for his follow-up diabetic visit. He has type 2 diabetes mellitus. His disease course has been stable. There are no hypoglycemic associated symptoms. There are no diabetic associated symptoms. Pertinent negatives for diabetes include no blurred vision, no foot paresthesias, no foot ulcerations and no visual change. There are no hypoglycemic complications. Symptoms are stable. Pertinent negatives for diabetic complications include no CVA, heart disease or peripheral neuropathy. Risk factors for coronary artery disease include diabetes mellitus, dyslipidemia, obesity, hypertension and sedentary lifestyle. He is following a generally unhealthy diet. (Does not check BS at home) An ACE inhibitor/angiotensin II receptor blocker is being taken.  Hypertension  This is a chronic problem. The current episode started more than 1 year ago. The problem has been resolved since onset. The problem is controlled. Pertinent negatives include no blurred vision, malaise/fatigue, peripheral edema or shortness of breath. Risk factors for coronary artery disease include diabetes mellitus, dyslipidemia, obesity, male gender and sedentary lifestyle. The current treatment provides moderate improvement. There is no history of kidney disease, CAD/MI or CVA.  Gastroesophageal Reflux  He reports no belching, no coughing or no heartburn. This is a chronic problem. The current episode started more than 1 year ago. The problem occurs occasionally. The problem has been resolved. He has tried a PPI for the symptoms. The treatment provided moderate relief.  Arthritis  Presents for follow-up visit. He complains of pain and stiffness. The symptoms have been stable. Affected locations include the right knee and left knee (back). His pain is at a severity of 10/10.       Review of Systems  Constitutional: Negative for malaise/fatigue.  Eyes: Negative for blurred vision.  Respiratory: Negative for cough and shortness of breath.   Gastrointestinal: Negative for heartburn.  Musculoskeletal: Positive for arthralgias, arthritis, back pain and stiffness.  All other systems reviewed and are negative.      Objective:   Physical Exam  Constitutional: He is oriented to person, place, and time. He appears well-developed and well-nourished. No distress.  Morbid obese   HENT:  Head: Normocephalic.  Right Ear: External ear normal.  Left Ear: External ear normal.  Nose: Nose normal.  Mouth/Throat: Oropharynx is clear and moist.  Eyes: Pupils are equal, round, and reactive to light. Right eye exhibits no discharge. Left eye exhibits no discharge.  Neck: Normal range of motion. Neck supple. No thyromegaly present.  Cardiovascular: Normal rate, regular rhythm, normal heart sounds and intact distal pulses.   No murmur heard. Pulmonary/Chest: Effort normal and breath sounds normal. No respiratory distress. He has no wheezes.  Abdominal: Soft. Bowel sounds are normal. He exhibits distension. There is no tenderness.  Musculoskeletal: Normal range of motion. He exhibits no edema or tenderness.  Neurological: He is alert and oriented to person, place, and time.  Skin: Skin is warm and dry. No rash noted. No erythema.  Psychiatric: He has a normal mood and affect. His behavior is normal. Judgment and thought content normal.  Vitals reviewed.   BP 118/82   Pulse 78   Temp 98 F (36.7 C) (Oral)   Ht 5' 6"  (1.676 m)   Wt (!) 349 lb 12.8 oz (158.7 kg)   BMI 56.46 kg/m      Assessment & Plan:  1. Essential hypertension - CMP14+EGFR  2.  Gastroesophageal reflux disease, esophagitis presence not specified - CMP14+EGFR  3. Type 2 diabetes mellitus with hyperglycemia, without long-term current use of insulin (HCC) - CMP14+EGFR - Bayer DCA Hb A1c  Waived  4. Morbid obesity ([place]) - CMP14+EGFR  5. Arthritis - CMP14+EGFR - traMADol (ULTRAM) 50 MG tablet; TAKE 1 TO 2 TABLETS EVERY 8 HOURS AS NEEDED FOR PAIN  Dispense: 120 tablet; Refill: 2   Continue all meds Labs pending Health Maintenance reviewed Diet and exercise encouraged RTO 3 months   Evelina Dun, FNP

## 2017-06-18 ENCOUNTER — Ambulatory Visit: Payer: Medicaid Other | Admitting: Family

## 2017-07-30 ENCOUNTER — Other Ambulatory Visit: Payer: Self-pay | Admitting: Family

## 2017-07-30 DIAGNOSIS — M1990 Unspecified osteoarthritis, unspecified site: Secondary | ICD-10-CM

## 2017-08-11 ENCOUNTER — Other Ambulatory Visit: Payer: Self-pay | Admitting: Family

## 2017-08-11 DIAGNOSIS — I10 Essential (primary) hypertension: Secondary | ICD-10-CM

## 2017-08-25 ENCOUNTER — Other Ambulatory Visit: Payer: Self-pay | Admitting: Family

## 2017-08-25 DIAGNOSIS — K219 Gastro-esophageal reflux disease without esophagitis: Secondary | ICD-10-CM

## 2017-08-25 DIAGNOSIS — I10 Essential (primary) hypertension: Secondary | ICD-10-CM

## 2017-09-12 ENCOUNTER — Ambulatory Visit (INDEPENDENT_AMBULATORY_CARE_PROVIDER_SITE_OTHER): Payer: Medicare Other | Admitting: Family

## 2017-09-12 ENCOUNTER — Encounter: Payer: Self-pay | Admitting: Family

## 2017-09-12 VITALS — BP 122/79 | HR 70 | Temp 97.9°F | Ht 66.0 in | Wt 347.0 lb

## 2017-09-12 DIAGNOSIS — G2581 Restless legs syndrome: Secondary | ICD-10-CM | POA: Diagnosis not present

## 2017-09-12 DIAGNOSIS — K219 Gastro-esophageal reflux disease without esophagitis: Secondary | ICD-10-CM | POA: Diagnosis not present

## 2017-09-12 DIAGNOSIS — E785 Hyperlipidemia, unspecified: Secondary | ICD-10-CM

## 2017-09-12 DIAGNOSIS — E1165 Type 2 diabetes mellitus with hyperglycemia: Secondary | ICD-10-CM

## 2017-09-12 DIAGNOSIS — I10 Essential (primary) hypertension: Secondary | ICD-10-CM

## 2017-09-12 DIAGNOSIS — M1990 Unspecified osteoarthritis, unspecified site: Secondary | ICD-10-CM

## 2017-09-12 DIAGNOSIS — Z1211 Encounter for screening for malignant neoplasm of colon: Secondary | ICD-10-CM

## 2017-09-12 DIAGNOSIS — E1169 Type 2 diabetes mellitus with other specified complication: Secondary | ICD-10-CM | POA: Diagnosis not present

## 2017-09-12 DIAGNOSIS — I440 Atrioventricular block, first degree: Secondary | ICD-10-CM

## 2017-09-12 DIAGNOSIS — E1159 Type 2 diabetes mellitus with other circulatory complications: Secondary | ICD-10-CM

## 2017-09-12 LAB — BAYER DCA HB A1C WAIVED: HB A1C: 5.9 % (ref <7.0)

## 2017-09-12 MED ORDER — ATORVASTATIN CALCIUM 10 MG PO TABS: 10.0000 mg | ORAL_TABLET | Freq: Every day | ORAL | 3 refills | Status: DC

## 2017-09-12 MED ORDER — TRAMADOL HCL 50 MG PO TABS: ORAL_TABLET | ORAL | 2 refills | Status: DC

## 2017-09-12 MED ORDER — ROPINIROLE HCL 0.5 MG PO TABS: 0.5000 mg | ORAL_TABLET | Freq: Every day | ORAL | 1 refills | Status: DC

## 2017-09-12 NOTE — Progress Notes (Addendum)
Subjective:    Patient ID: Ernest Martinez, male    DOB: 1955-11-03, 61 y.o.   MRN: [id number]  Pt presents to the office today for chronic follow up. Pt complaining of jerking and movement of his legs that is worse at night. Pt states he has had this for a long time, but over the last few months has become worse.  Diabetes  He presents for his follow-up diabetic visit. He has type 2 diabetes mellitus. His disease course has been stable. There are no hypoglycemic associated symptoms. Pertinent negatives for hypoglycemia include no headaches. There are no diabetic associated symptoms. Symptoms are stable. There are no diabetic complications. Risk factors for coronary artery disease include dyslipidemia, diabetes mellitus, male sex, obesity, hypertension, stress and sedentary lifestyle. He is following a generally unhealthy diet. (Does not check blood glucose at home ) Eye exam is current.  Hypertension  This is a chronic problem. The current episode started more than 1 year ago. The problem has been resolved since onset. The problem is controlled. Pertinent negatives include no headaches, peripheral edema or shortness of breath. Risk factors for coronary artery disease include dyslipidemia, diabetes mellitus, obesity and male gender. The current treatment provides moderate improvement. Hypertensive end-organ damage includes heart failure. There is no history of kidney disease.  Hyperlipidemia  This is a chronic problem. The current episode started more than 1 year ago. The problem is controlled. Recent lipid tests were reviewed and are normal. Exacerbating diseases include obesity. Pertinent negatives include no shortness of breath. He is currently on no antihyperlipidemic treatment. The current treatment provides no improvement of lipids. Risk factors for coronary artery disease include diabetes mellitus, dyslipidemia, male sex, obesity, hypertension and a sedentary lifestyle.  Gastroesophageal Reflux    He reports no belching, no coughing or no heartburn. This is a chronic problem. The current episode started more than 1 year ago. The problem occurs occasionally. The problem has been waxing and waning. The symptoms are aggravated by certain foods. Risk factors include obesity. He has tried a PPI for the symptoms. The treatment provided moderate relief.  Arthritis  Presents for follow-up visit. He complains of pain, stiffness and joint swelling. The symptoms have been worsening. Affected locations include the right knee and left knee. His pain is at a severity of 10/10.      Review of Systems  Respiratory: Negative for cough and shortness of breath.   Gastrointestinal: Negative for heartburn.  Musculoskeletal: Positive for arthritis, joint swelling and stiffness.  Neurological: Negative for headaches.  All other systems reviewed and are negative.      Objective:   Physical Exam  Constitutional: He is oriented to person, place, and time. He appears well-developed and well-nourished. No distress.  Morbid obese   HENT:  Head: Normocephalic.  Right Ear: External ear normal.  Left Ear: External ear normal.  Nose: Nose normal.  Mouth/Throat: Oropharynx is clear and moist.  Eyes: Pupils are equal, round, and reactive to light. Right eye exhibits no discharge. Left eye exhibits no discharge.  Neck: Normal range of motion. Neck supple. No thyromegaly present.  Cardiovascular: Normal rate, regular rhythm, normal heart sounds and intact distal pulses.  No murmur heard. Pulmonary/Chest: Effort normal and breath sounds normal. No respiratory distress. He has no wheezes.  Abdominal: Soft. Bowel sounds are normal. He exhibits no distension. There is no tenderness.  Musculoskeletal: He exhibits tenderness (pain in bilateral knees with flexion and extension). He exhibits no edema.  Neurological: He is  alert and oriented to person, place, and time.  Skin: Skin is warm and dry. No rash noted. No  erythema.  Psychiatric: He has a normal mood and affect. His behavior is normal. Judgment and thought content normal.  Vitals reviewed.     BP 122/79   Pulse 70   Temp 97.9 F (36.6 C) (Oral)   Ht _0  (1.676 m)   Wt (!) 347 lb (157.4 kg)   BMI 56.01 kg/m      Assessment & Plan:  1. Arthritis - CMP14+EGFR - traMADol (ULTRAM) 50 MG tablet; TAKE 1 TO 2 TABLETS EVERY 8 HOURS AS NEEDED FOR PAIN  Dispense: 120 tablet; Refill: 2  2. Hypertension associated with diabetes ([place]) - CMP14+EGFR  3. Gastroesophageal reflux disease, esophagitis presence not specified - CMP14+EGFR  4. Type 2 diabetes mellitus with hyperglycemia, without long-term current use of insulin (HCC) - Bayer DCA Hb A1c Waived - CMP14+EGFR - Microalbumin / creatinine urine ratio  5. Morbid obesity ([place]) - CMP14+EGFR  6. Hyperlipidemia associated with type 2 diabetes mellitus ([place]) Will start pt on Lipitor today. If can't tolerate will try every other day - CMP14+EGFR - Lipid panel - atorvastatin (LIPITOR) 10 MG tablet; Take 1 tablet (10 mg total) by mouth daily.  Dispense: 90 tablet; Refill: 3  7. 1st degree AV block - CMP14+EGFR  8. Colon cancer screening - CMP14+EGFR - Fecal occult blood, imunochemical; Future  9. Restless leg syndrome Will start Requip today - CMP14+EGFR - rOPINIRole (REQUIP) 0.5 MG tablet; Take 1 tablet (0.5 mg total) by mouth at bedtime.  Dispense: 90 tablet; Refill: 1   Continue all meds Labs pending Health Maintenance reviewed Diet and exercise encouraged RTO 3 months   Evelina Dun, FNP

## 2017-09-12 NOTE — Patient Instructions (Addendum)

## 2017-09-13 LAB — CMP14+EGFR
A/G RATIO: 1.3 (ref 1.2–2.2)
ALT: 19 IU/L (ref 0–44)
AST: 17 IU/L (ref 0–40)
Albumin: 3.9 g/dL (ref 3.6–4.8)
Alkaline Phosphatase: 56 IU/L (ref 39–117)
BILIRUBIN TOTAL: 0.4 mg/dL (ref 0.0–1.2)
BUN/Creatinine Ratio: 13 (ref 10–24)
BUN: 11 mg/dL (ref 8–27)
CALCIUM: 9.6 mg/dL (ref 8.6–10.2)
CHLORIDE: 99 mmol/L (ref 96–106)
CO2: 26 mmol/L (ref 20–29)
Creatinine, Ser: 0.82 mg/dL (ref 0.76–1.27)
GFR calc Af Amer: 110 mL/min/{1.73_m2} (ref >59)
GFR calc non Af Amer: 95 mL/min/{1.73_m2} (ref >59)
GLUCOSE: 119 mg/dL — AB (ref 65–99)
Globulin, Total: 2.9 g/dL (ref 1.5–4.5)
POTASSIUM: 3.8 mmol/L (ref 3.5–5.2)
Sodium: 141 mmol/L (ref 134–144)
TOTAL PROTEIN: 6.8 g/dL (ref 6.0–8.5)

## 2017-09-13 LAB — MICROALBUMIN / CREATININE URINE RATIO
Creatinine, Urine: 55.1 mg/dL
Microalb/Creat Ratio: <5.4 mg/g creat (ref 0.0–30.0)
Microalbumin, Urine: <3 ug/mL

## 2017-09-13 LAB — LIPID PANEL
Chol/HDL Ratio: 3.1 ratio (ref 0.0–5.0)
Cholesterol, Total: 153 mg/dL (ref 100–199)
HDL: 50 mg/dL (ref >39)
LDL Calculated: 89 mg/dL (ref 0–99)
TRIGLYCERIDES: 69 mg/dL (ref 0–149)
VLDL CHOLESTEROL CAL: 14 mg/dL (ref 5–40)

## 2017-09-19 ENCOUNTER — Ambulatory Visit: Payer: Medicare Other | Admitting: Family

## 2017-10-13 ENCOUNTER — Other Ambulatory Visit: Payer: Self-pay | Admitting: Family

## 2017-10-13 DIAGNOSIS — M1990 Unspecified osteoarthritis, unspecified site: Secondary | ICD-10-CM

## 2017-10-29 ENCOUNTER — Encounter: Payer: Self-pay | Admitting: Family Medicine

## 2017-10-29 ENCOUNTER — Ambulatory Visit (INDEPENDENT_AMBULATORY_CARE_PROVIDER_SITE_OTHER): Payer: Medicare Other | Admitting: Family Medicine

## 2017-10-29 VITALS — BP 132/81 | HR 75 | Temp 98.9°F | Ht 69.0 in | Wt 345.0 lb

## 2017-10-29 DIAGNOSIS — Z024 Encounter for examination for driving license: Secondary | ICD-10-CM

## 2017-10-29 NOTE — Progress Notes (Signed)
Subjective:  Patient ID: Ernest Martinez, male    DOB: 06-09-1956  Age: 62 y.o. MRN: [id number]  CC: Private DOT Physical   HPI Ernest Martinez presents for DOT physical.  He has a history of high blood pressure.  This has limited him to 1 year certificate.  Depression screen Ernest Martinez 2/9 09/12/2017 06/13/2017 03/14/2017  Decreased Interest 0 0 0  Down, Depressed, Hopeless 0 0 0  PHQ - 2 Score 0 0 0    History Ernest Martinez has a past medical history of Abnormal EKG, Bacteremia due to Gram-negative bacteria, Bladder filling defect, Borderline diabetes, Dislocation of right shoulder joint (sept 2014), GERD (gastroesophageal reflux disease), Hydrocele of testis, Hypertension, and Morbid obesity ([place]).   He has a past surgical history that includes None and Hydrocele surgery (Right, 08/16/2013).   His family history includes Cancer in his father.He reports that  has never smoked. he has never used smokeless tobacco. He reports that he drinks alcohol. He reports that he does not use drugs.    ROS Review of Systems  Constitutional: Negative for chills, diaphoresis, fever and unexpected weight change.  HENT: Negative for congestion, hearing loss, rhinorrhea and sore throat.   Eyes: Negative for visual disturbance.  Respiratory: Negative for cough and shortness of breath.   Cardiovascular: Negative for chest pain.  Gastrointestinal: Negative for abdominal pain, constipation and diarrhea.  Genitourinary: Negative for dysuria and flank pain.  Musculoskeletal: Negative for arthralgias and joint swelling.  Skin: Negative for rash.  Neurological: Negative for dizziness and headaches.  Psychiatric/Behavioral: Negative for dysphoric mood and sleep disturbance.    Objective:  BP 132/81   Pulse 75   Temp 98.9 F (37.2 C) (Oral)   Ht 5\' 9"  (1.753 m)   Wt (!) 345 lb (156.5 kg)   BMI 50.95 kg/m   BP Readings from Last 3 Encounters:  10/29/17 132/81  09/12/17 122/79  06/13/17 118/82    Wt Readings  from Last 3 Encounters:  10/29/17 (!) 345 lb (156.5 kg)  09/12/17 (!) 347 lb (157.4 kg)  06/13/17 (!) 349 lb 12.8 oz (158.7 kg)     Physical Exam  Constitutional: He is oriented to person, place, and time. He appears well-developed and well-nourished. No distress.  HENT:  Head: Normocephalic and atraumatic.  Right Ear: External ear normal.  Left Ear: External ear normal.  Nose: Nose normal.  Mouth/Throat: Oropharynx is clear and moist.  Eyes: Conjunctivae and EOM are normal. Pupils are equal, round, and reactive to light.  Neck: Normal range of motion. Neck supple. No thyromegaly present.  Cardiovascular: Normal rate, regular rhythm and normal heart sounds.  No murmur heard. Pulmonary/Chest: Effort normal and breath sounds normal. No respiratory distress. He has no wheezes. He has no rales.  Abdominal: Soft. Bowel sounds are normal. He exhibits no distension. There is no tenderness.  Lymphadenopathy:    He has no cervical adenopathy.  Neurological: He is alert and oriented to person, place, and time. He has normal reflexes.  Skin: Skin is warm and dry.  Psychiatric: He has a normal mood and affect. His behavior is normal. Judgment and thought content normal.      Assessment & Plan:   Ernest Martinez was seen today for private dot physical.  Diagnoses and all orders for this visit:  Encounter for Department of Transportation (DOT) examination for driving license renewal     I am having Ernest Martinez maintain his ibuprofen, aspirin EC, furosemide, amLODipine, metFORMIN, omeprazole, lisinopril-hydrochlorothiazide, traMADol, atorvastatin,  rOPINIRole, and celecoxib.  Allergies as of 10/29/2017      Reactions   Lovastatin    Headache and burning in stomach.   Metoprolol Tartrate    headache      Medication List        Accurate as of 10/29/17  2:55 PM. Always use your most recent med list.          ADVIL 200 MG tablet Generic drug:  ibuprofen Take 600 mg by mouth daily as  needed.   amLODipine 10 MG tablet Commonly known as:  NORVASC TAKE ONE TABLET EVERY MORNING   aspirin EC 81 MG tablet Take 1 tablet (81 mg total) by mouth daily.   atorvastatin 10 MG tablet Commonly known as:  LIPITOR Take 1 tablet (10 mg total) by mouth daily.   celecoxib 200 MG capsule Commonly known as:  CELEBREX TAKE ONE (1) CAPSULE EACH DAY   furosemide 20 MG tablet Commonly known as:  LASIX TAKE ONE TABLET EVERY MORNING   lisinopril-hydrochlorothiazide 20-12.5 MG tablet Commonly known as:  PRINZIDE,ZESTORETIC TAKE ONE (1) TABLET EACH DAY   metFORMIN 1000 MG tablet Commonly known as:  GLUCOPHAGE TAKE ONE TABLET TWICE A DAY WITH FOOD   omeprazole 20 MG capsule Commonly known as:  PRILOSEC TAKE ONE CAPSULE EACH MORNING   rOPINIRole 0.5 MG tablet Commonly known as:  REQUIP Take 1 tablet (0.5 mg total) by mouth at bedtime.   traMADol 50 MG tablet Commonly known as:  ULTRAM TAKE 1 TO 2 TABLETS EVERY 8 HOURS AS NEEDED FOR PAIN        Follow-up: Return in about 1 year (around 10/29/2018).  Claretta Fraise, M.D.

## 2017-11-10 ENCOUNTER — Other Ambulatory Visit: Payer: Self-pay | Admitting: Family

## 2017-11-10 DIAGNOSIS — I10 Essential (primary) hypertension: Secondary | ICD-10-CM

## 2017-11-21 ENCOUNTER — Other Ambulatory Visit: Payer: Self-pay | Admitting: Family

## 2017-11-21 DIAGNOSIS — I10 Essential (primary) hypertension: Secondary | ICD-10-CM

## 2017-11-27 ENCOUNTER — Other Ambulatory Visit: Payer: Self-pay | Admitting: Family

## 2017-11-27 DIAGNOSIS — G2581 Restless legs syndrome: Secondary | ICD-10-CM

## 2017-12-11 ENCOUNTER — Ambulatory Visit: Payer: Medicare Other | Admitting: Family

## 2017-12-11 ENCOUNTER — Encounter: Payer: Self-pay | Admitting: Family

## 2017-12-11 VITALS — BP 126/79 | HR 72 | Temp 97.2°F | Ht 69.0 in | Wt 337.2 lb

## 2017-12-11 DIAGNOSIS — I10 Essential (primary) hypertension: Secondary | ICD-10-CM

## 2017-12-11 DIAGNOSIS — Z1211 Encounter for screening for malignant neoplasm of colon: Secondary | ICD-10-CM | POA: Diagnosis not present

## 2017-12-11 DIAGNOSIS — E785 Hyperlipidemia, unspecified: Secondary | ICD-10-CM | POA: Diagnosis not present

## 2017-12-11 DIAGNOSIS — K219 Gastro-esophageal reflux disease without esophagitis: Secondary | ICD-10-CM | POA: Diagnosis not present

## 2017-12-11 DIAGNOSIS — E1165 Type 2 diabetes mellitus with hyperglycemia: Secondary | ICD-10-CM | POA: Diagnosis not present

## 2017-12-11 DIAGNOSIS — E1169 Type 2 diabetes mellitus with other specified complication: Secondary | ICD-10-CM

## 2017-12-11 DIAGNOSIS — E1159 Type 2 diabetes mellitus with other circulatory complications: Secondary | ICD-10-CM

## 2017-12-11 DIAGNOSIS — G2581 Restless legs syndrome: Secondary | ICD-10-CM | POA: Insufficient documentation

## 2017-12-11 DIAGNOSIS — M1990 Unspecified osteoarthritis, unspecified site: Secondary | ICD-10-CM | POA: Diagnosis not present

## 2017-12-11 LAB — CMP14+EGFR
A/G RATIO: 1.5 (ref 1.2–2.2)
ALK PHOS: 55 IU/L (ref 39–117)
ALT: 16 IU/L (ref 0–44)
AST: 16 IU/L (ref 0–40)
Albumin: 3.9 g/dL (ref 3.6–4.8)
BILIRUBIN TOTAL: 0.4 mg/dL (ref 0.0–1.2)
BUN/Creatinine Ratio: 12 (ref 10–24)
BUN: 10 mg/dL (ref 8–27)
CHLORIDE: 103 mmol/L (ref 96–106)
CO2: 24 mmol/L (ref 20–29)
Calcium: 9.5 mg/dL (ref 8.6–10.2)
Creatinine, Ser: 0.86 mg/dL (ref 0.76–1.27)
GFR calc Af Amer: 108 mL/min/{1.73_m2} (ref >59)
GFR, EST NON AFRICAN AMERICAN: 94 mL/min/{1.73_m2} (ref >59)
GLOBULIN, TOTAL: 2.6 g/dL (ref 1.5–4.5)
Glucose: 91 mg/dL (ref 65–99)
POTASSIUM: 3.7 mmol/L (ref 3.5–5.2)
Sodium: 144 mmol/L (ref 134–144)
Total Protein: 6.5 g/dL (ref 6.0–8.5)

## 2017-12-11 LAB — LIPID PANEL
CHOL/HDL RATIO: 3.2 ratio (ref 0.0–5.0)
Cholesterol, Total: 148 mg/dL (ref 100–199)
HDL: 46 mg/dL (ref >39)
LDL Calculated: 84 mg/dL (ref 0–99)
TRIGLYCERIDES: 88 mg/dL (ref 0–149)
VLDL Cholesterol Cal: 18 mg/dL (ref 5–40)

## 2017-12-11 LAB — BAYER DCA HB A1C WAIVED: HB A1C: 5.7 % (ref <7.0)

## 2017-12-11 MED ORDER — ROPINIROLE HCL 1 MG PO TABS: 1.0000 mg | ORAL_TABLET | Freq: Every day | ORAL | 1 refills | Status: DC

## 2017-12-11 MED ORDER — TRAMADOL HCL 50 MG PO TABS: ORAL_TABLET | ORAL | 2 refills | Status: DC

## 2017-12-11 NOTE — Progress Notes (Signed)
Subjective:    Patient ID: Ernest Martinez, male    DOB: 18-Feb-1956, 62 y.o.   MRN: [id number]  Pt presents to the office today for chronic follow up and pain medication refill.  Diabetes  He presents for his follow-up diabetic visit. He has type 2 diabetes mellitus. His disease course has been stable. There are no hypoglycemic associated symptoms. Pertinent negatives for diabetes include no blurred vision, no foot paresthesias and no visual change. There are no hypoglycemic complications. Symptoms are stable. Diabetic complications include heart disease. Pertinent negatives for diabetic complications include no CVA, nephropathy or peripheral neuropathy. Risk factors for coronary artery disease include diabetes mellitus, dyslipidemia, family history, male sex, hypertension and sedentary lifestyle. His weight is stable. He is following a generally healthy diet. (Does not check BS at home) An ACE inhibitor/angiotensin II receptor blocker is being taken.  Hypertension  This is a chronic problem. The current episode started more than 1 year ago. The problem has been resolved since onset. The problem is controlled. Pertinent negatives include no blurred vision, peripheral edema or shortness of breath. Risk factors for coronary artery disease include dyslipidemia, diabetes mellitus, family history, obesity, male gender and sedentary lifestyle. The current treatment provides moderate improvement. Hypertensive end-organ damage includes CAD/MI. There is no history of CVA or heart failure.  Hyperlipidemia  This is a chronic problem. The current episode started more than 1 year ago. The problem is controlled. Recent lipid tests were reviewed and are normal. Exacerbating diseases include obesity. Pertinent negatives include no shortness of breath. Current antihyperlipidemic treatment includes statins. The current treatment provides moderate improvement of lipids.  Arthritis  Presents for follow-up visit. He  complains of pain and stiffness. Affected locations include the right knee and left knee. His pain is at a severity of 10/10.  [facility] PT states the requip was helping great for a month. Pt reports now he has jerking and moving of his legs.    Review of Systems  Eyes: Negative for blurred vision.  Respiratory: Negative for shortness of breath.   Musculoskeletal: Positive for arthritis and stiffness.  All other systems reviewed and are negative.      Objective:   Physical Exam  Constitutional: He is oriented to person, place, and time. He appears well-developed and well-nourished. No distress.  Morbid obese  HENT:  Head: Normocephalic.  Right Ear: External ear normal.  Left Ear: External ear normal.  Nose: Nose normal.  Mouth/Throat: Oropharynx is clear and moist.  Eyes: Pupils are equal, round, and reactive to light. Right eye exhibits no discharge. Left eye exhibits no discharge.  Neck: Normal range of motion. Neck supple. No thyromegaly present.  Cardiovascular: Normal rate, regular rhythm, normal heart sounds and intact distal pulses.  No murmur heard. Pulmonary/Chest: Effort normal and breath sounds normal. No respiratory distress. He has no wheezes.  Abdominal: Soft. Bowel sounds are normal. He exhibits no distension. There is no tenderness.  Musculoskeletal: Normal range of motion. He exhibits tenderness (bilateral pain in knees with flexion and extension). He exhibits no edema.  Neurological: He is alert and oriented to person, place, and time.  Skin: Skin is warm and dry. No rash noted. No erythema.  Psychiatric: He has a normal mood and affect. His behavior is normal. Judgment and thought content normal.  Vitals reviewed.     BP 126/79   Pulse 72   Temp (!) 97.2 F (36.2 C) (Oral)   Ht _0  (1.753 m)   Wt Marland Kitchen)  337 lb 3.2 oz (153 kg)   BMI 49.80 kg/m      Assessment & Plan:  1. Hypertension associated with diabetes ([place]) - CMP14+EGFR - Ambulatory referral to  Cardiology  2. Morbid obesity ([place]) - CMP14+EGFR  3. Type 2 diabetes mellitus with hyperglycemia, without long-term current use of insulin (HCC) - Bayer DCA Hb A1c Waived - CMP14+EGFR - Ambulatory referral to Ophthalmology  4. Hyperlipidemia associated with type 2 diabetes mellitus ([place]) - CMP14+EGFR - Lipid panel - Ambulatory referral to Cardiology  5. Gastroesophageal reflux disease, esophagitis presence not specified - CMP14+EGFR  6. Arthritis - CMP14+EGFR - traMADol (ULTRAM) 50 MG tablet; TAKE 1 TO 2 TABLETS EVERY 8 HOURS AS NEEDED FOR PAIN  Dispense: 120 tablet; Refill: 2  7. RLS (restless legs syndrome) - CMP14+EGFR - rOPINIRole (REQUIP) 1 MG tablet; Take 1 tablet (1 mg total) by mouth at bedtime.  Dispense: 90 tablet; Refill: 1  8. Colon cancer screening - Cologuard   Continue all meds Labs pending Health Maintenance reviewed Diet and exercise encouraged RTO 3 months  Evelina Dun, FNP

## 2017-12-11 NOTE — Patient Instructions (Signed)

## 2017-12-11 NOTE — Addendum Note (Signed)
Addended by: Evelina Dun A on: 12/11/2017 11:29 AM   Modules accepted: Orders

## 2018-01-12 ENCOUNTER — Other Ambulatory Visit: Payer: Self-pay | Admitting: Family

## 2018-01-12 DIAGNOSIS — K219 Gastro-esophageal reflux disease without esophagitis: Secondary | ICD-10-CM

## 2018-01-12 DIAGNOSIS — M1990 Unspecified osteoarthritis, unspecified site: Secondary | ICD-10-CM

## 2018-01-15 ENCOUNTER — Other Ambulatory Visit (INDEPENDENT_AMBULATORY_CARE_PROVIDER_SITE_OTHER): Payer: Medicare Other

## 2018-01-15 ENCOUNTER — Other Ambulatory Visit: Payer: Self-pay | Admitting: Orthopedic Surgery

## 2018-01-15 DIAGNOSIS — R52 Pain, unspecified: Secondary | ICD-10-CM

## 2018-01-15 DIAGNOSIS — M1711 Unilateral primary osteoarthritis, right knee: Secondary | ICD-10-CM | POA: Diagnosis not present

## 2018-01-15 DIAGNOSIS — M25561 Pain in right knee: Secondary | ICD-10-CM

## 2018-01-15 DIAGNOSIS — M1712 Unilateral primary osteoarthritis, left knee: Secondary | ICD-10-CM | POA: Diagnosis not present

## 2018-01-15 DIAGNOSIS — M25562 Pain in left knee: Secondary | ICD-10-CM | POA: Diagnosis not present

## 2018-02-04 ENCOUNTER — Other Ambulatory Visit: Payer: Self-pay | Admitting: Family

## 2018-02-16 ENCOUNTER — Other Ambulatory Visit: Payer: Self-pay | Admitting: Family

## 2018-02-16 DIAGNOSIS — M1990 Unspecified osteoarthritis, unspecified site: Secondary | ICD-10-CM

## 2018-02-16 DIAGNOSIS — I10 Essential (primary) hypertension: Secondary | ICD-10-CM

## 2018-03-09 ENCOUNTER — Other Ambulatory Visit: Payer: Self-pay | Admitting: Family

## 2018-03-09 DIAGNOSIS — G2581 Restless legs syndrome: Secondary | ICD-10-CM

## 2018-03-16 ENCOUNTER — Encounter: Payer: Self-pay | Admitting: Family

## 2018-03-16 ENCOUNTER — Ambulatory Visit (INDEPENDENT_AMBULATORY_CARE_PROVIDER_SITE_OTHER): Payer: Medicare Other | Admitting: Family

## 2018-03-16 VITALS — BP 119/74 | HR 87 | Temp 97.8°F | Ht 69.0 in | Wt 329.6 lb

## 2018-03-16 DIAGNOSIS — E1169 Type 2 diabetes mellitus with other specified complication: Secondary | ICD-10-CM

## 2018-03-16 DIAGNOSIS — E785 Hyperlipidemia, unspecified: Secondary | ICD-10-CM | POA: Diagnosis not present

## 2018-03-16 DIAGNOSIS — E1165 Type 2 diabetes mellitus with hyperglycemia: Secondary | ICD-10-CM

## 2018-03-16 DIAGNOSIS — R519 Headache, unspecified: Secondary | ICD-10-CM

## 2018-03-16 DIAGNOSIS — K219 Gastro-esophageal reflux disease without esophagitis: Secondary | ICD-10-CM | POA: Diagnosis not present

## 2018-03-16 DIAGNOSIS — I10 Essential (primary) hypertension: Secondary | ICD-10-CM

## 2018-03-16 DIAGNOSIS — R51 Headache: Secondary | ICD-10-CM | POA: Diagnosis not present

## 2018-03-16 DIAGNOSIS — E1159 Type 2 diabetes mellitus with other circulatory complications: Secondary | ICD-10-CM | POA: Diagnosis not present

## 2018-03-16 DIAGNOSIS — M1990 Unspecified osteoarthritis, unspecified site: Secondary | ICD-10-CM | POA: Diagnosis not present

## 2018-03-16 LAB — CMP14+EGFR
A/G RATIO: 1.5 (ref 1.2–2.2)
ALBUMIN: 3.8 g/dL (ref 3.6–4.8)
ALK PHOS: 60 IU/L (ref 39–117)
ALT: 12 IU/L (ref 0–44)
AST: 8 IU/L (ref 0–40)
BILIRUBIN TOTAL: 0.4 mg/dL (ref 0.0–1.2)
BUN / CREAT RATIO: 21 (ref 10–24)
BUN: 18 mg/dL (ref 8–27)
CHLORIDE: 101 mmol/L (ref 96–106)
CO2: 25 mmol/L (ref 20–29)
Calcium: 9 mg/dL (ref 8.6–10.2)
Creatinine, Ser: 0.85 mg/dL (ref 0.76–1.27)
GFR calc non Af Amer: 94 mL/min/{1.73_m2} (ref >59)
GFR, EST AFRICAN AMERICAN: 109 mL/min/{1.73_m2} (ref >59)
Globulin, Total: 2.6 g/dL (ref 1.5–4.5)
Glucose: 125 mg/dL — ABNORMAL HIGH (ref 65–99)
POTASSIUM: 3.9 mmol/L (ref 3.5–5.2)
SODIUM: 140 mmol/L (ref 134–144)
Total Protein: 6.4 g/dL (ref 6.0–8.5)

## 2018-03-16 LAB — CBC WITH DIFFERENTIAL/PLATELET
BASOS ABS: 0 10*3/uL (ref 0.0–0.2)
Basos: 0 %
EOS (ABSOLUTE): 0.2 10*3/uL (ref 0.0–0.4)
Eos: 2 %
HEMOGLOBIN: 14 g/dL (ref 13.0–17.7)
Hematocrit: 40.9 % (ref 37.5–51.0)
Immature Grans (Abs): 0 10*3/uL (ref 0.0–0.1)
Immature Granulocytes: 0 %
LYMPHS ABS: 1.7 10*3/uL (ref 0.7–3.1)
Lymphs: 20 %
MCH: 30.3 pg (ref 26.6–33.0)
MCHC: 34.2 g/dL (ref 31.5–35.7)
MCV: 89 fL (ref 79–97)
MONOCYTES: 6 %
MONOS ABS: 0.5 10*3/uL (ref 0.1–0.9)
NEUTROS ABS: 6.2 10*3/uL (ref 1.4–7.0)
Neutrophils: 72 %
Platelets: 250 10*3/uL (ref 150–450)
RBC: 4.62 x10E6/uL (ref 4.14–5.80)
RDW: 13.3 % (ref 12.3–15.4)
WBC: 8.6 10*3/uL (ref 3.4–10.8)

## 2018-03-16 LAB — BAYER DCA HB A1C WAIVED: HB A1C: 5.9 % (ref <7.0)

## 2018-03-16 MED ORDER — TRAMADOL HCL 50 MG PO TABS: ORAL_TABLET | ORAL | 2 refills | Status: DC

## 2018-03-16 NOTE — Patient Instructions (Signed)
General Headache Without Cause A headache is pain or discomfort felt around the head or neck area. The specific cause of a headache may not be found. There are many causes and types of headaches. A few common ones are:  Tension headaches.  Migraine headaches.  Cluster headaches.  Chronic daily headaches.  Follow these instructions at home: Watch your condition for any changes. Take these steps to help with your condition: Managing pain  Take over-the-counter and prescription medicines only as told by your health care provider.  Lie down in a dark, quiet room when you have a headache.  If directed, apply ice to the head and neck area: ? Put ice in a plastic bag. ? Place a towel between your skin and the bag. ? Leave the ice on for 20 minutes, 2-3 times per day.  Use a heating pad or hot shower to apply heat to the head and neck area as told by your health care provider.  Keep lights dim if bright lights bother you or make your headaches worse. Eating and drinking  Eat meals on a regular schedule.  Limit alcohol use.  Decrease the amount of caffeine you drink, or stop drinking caffeine. General instructions  Keep all follow-up visits as told by your health care provider. This is important.  Keep a headache journal to help find out what may trigger your headaches. For example, write down: ? What you eat and drink. ? How much sleep you get. ? Any change to your diet or medicines.  Try massage or other relaxation techniques.  Limit stress.  Sit up straight, and do not tense your muscles.  Do not use tobacco products, including cigarettes, chewing tobacco, or e-cigarettes. If you need help quitting, ask your health care provider.  Exercise regularly as told by your health care provider.  Sleep on a regular schedule. Get 7-9 hours of sleep, or the amount recommended by your health care provider. Contact a health care provider if:  Your symptoms are not helped by  medicine.  You have a headache that is different from the usual headache.  You have nausea or you vomit.  You have a fever. Get help right away if:  Your headache becomes severe.  You have repeated vomiting.  You have a stiff neck.  You have a loss of vision.  You have problems with speech.  You have pain in the eye or ear.  You have muscular weakness or loss of muscle control.  You lose your balance or have trouble walking.  You feel faint or pass out.  You have confusion. This information is not intended to replace advice given to you by your health care provider. Make sure you discuss any questions you have with your health care provider. Document Released: 09/09/2005 Document Revised: 02/15/2016 Document Reviewed: 01/02/2015 Elsevier Interactive Patient Education  2018 Else[employer] Inc[employer]  

## 2018-03-16 NOTE — Progress Notes (Signed)
Subjective:    Patient ID: Ernest Martinez, male    DOB: [birth year], 62 y.o.   MRN: [id number]  Chief Complaint  Patient presents with  . Medical Management of Chronic Issues    three month recheck   Diabetes  He presents for his follow-up diabetic visit. He has type 2 diabetes mellitus. His disease course has been stable. Hypoglycemia symptoms include headaches. There are no diabetic associated symptoms. Pertinent negatives for diabetes include no blurred vision, no foot paresthesias, no visual change and no weakness. Symptoms are stable. Diabetic complications include heart disease. Pertinent negatives for diabetic complications include no CVA or peripheral neuropathy. Risk factors for coronary artery disease include diabetes mellitus, dyslipidemia, male sex, hypertension and sedentary lifestyle. He is following a generally unhealthy diet. (Does not check BS at home ) Eye exam is not current.  Hypertension  This is a chronic problem. The current episode started more than 1 year ago. The problem has been resolved since onset. The problem is controlled. Associated symptoms include headaches. Pertinent negatives include no blurred vision, malaise/fatigue, peripheral edema or shortness of breath. The current treatment provides moderate improvement. Hypertensive end-organ damage includes CAD/MI. There is no history of kidney disease or CVA.  Hyperlipidemia  This is a chronic problem. The current episode started more than 1 year ago. The problem is controlled. Recent lipid tests were reviewed and are normal. Exacerbating diseases include obesity. Pertinent negatives include no shortness of breath. Current antihyperlipidemic treatment includes statins. The current treatment provides moderate improvement of lipids. Risk factors for coronary artery disease include dyslipidemia, diabetes mellitus, male sex, hypertension and a sedentary lifestyle.  Arthritis  Presents for follow-up visit. He complains of  pain and stiffness. The symptoms have been stable. Affected locations include the right knee and left knee. His pain is at a severity of 5/10.  Headache   This is a new problem. The current episode started 1 to 4 weeks ago. The problem occurs intermittently. The problem has been waxing and waning. The pain is located in the frontal region. The pain does not radiate. The quality of the pain is described as dull. The pain is mild. Pertinent negatives include no blurred vision, loss of balance, phonophobia, photophobia, rhinorrhea, visual change or weakness. He has tried NSAIDs for the symptoms. The treatment provided mild relief. His past medical history is significant for hypertension and obesity.      Review of Systems  Constitutional: Negative for malaise/fatigue.  HENT: Negative for rhinorrhea.   Eyes: Negative for blurred vision and photophobia.  Respiratory: Negative for shortness of breath.   Musculoskeletal: Positive for arthralgias, arthritis and stiffness.  Neurological: Positive for headaches. Negative for weakness and loss of balance.  All other systems reviewed and are negative.      Objective:   Physical Exam  Constitutional: He is oriented to person, place, and time. He appears well-developed and well-nourished. No distress.  Morbid obese   HENT:  Head: Normocephalic.  Right Ear: External ear normal.  Left Ear: External ear normal.  Mouth/Throat: Oropharynx is clear and moist.  Eyes: Pupils are equal, round, and reactive to light. Right eye exhibits no discharge. Left eye exhibits no discharge.  Neck: Normal range of motion. Neck supple. No thyromegaly present.  Cardiovascular: Normal rate, regular rhythm, normal heart sounds and intact distal pulses.  No murmur heard. Pulmonary/Chest: Effort normal and breath sounds normal. No respiratory distress. He has no wheezes.  Abdominal: Soft. Bowel sounds are normal. He exhibits no  distension. There is no tenderness.    Musculoskeletal: Normal range of motion. He exhibits no edema or tenderness.  Neurological: He is alert and oriented to person, place, and time. He has normal reflexes. No cranial nerve deficit.  Skin: Skin is warm and dry. No rash noted. No erythema.  Psychiatric: He has a normal mood and affect. His behavior is normal. Judgment and thought content normal.  Vitals reviewed.     BP 119/74   Pulse 87   Temp 97.8 F (36.6 C) (Oral)   Ht _0  (1.753 m)   Wt (!) 329 lb 9.6 oz (149.5 kg)   BMI 48.67 kg/m      Assessment & Plan:  TIMITHY Martinez comes in today with chief complaint of Medical Management of Chronic Issues (three month recheck)   Diagnosis and orders addressed:  1. Type 2 diabetes mellitus with hyperglycemia, without long-term current use of insulin (HCC) - CMP14+EGFR - CBC with Differential/Platelet - Bayer DCA Hb A1c Waived  2. Arthritis - CMP14+EGFR - CBC with Differential/Platelet - traMADol (ULTRAM) 50 MG tablet; TAKE 1 TO 2 TABLETS EVERY 8 HOURS AS NEEDED FOR PAIN  Dispense: 120 tablet; Refill: 2  3. Gastroesophageal reflux disease, esophagitis presence not specified - CMP14+EGFR - CBC with Differential/Platelet  4. Hyperlipidemia associated with type 2 diabetes mellitus ([facility]) - CMP14+EGFR - CBC with Differential/Platelet  5. Morbid obesity ([place]) - CMP14+EGFR - CBC with Differential/Platelet  6. Hypertension associated with diabetes ([place]) - CMP14+EGFR - CBC with Differential/Platelet  7. Nonintractable headache, unspecified chronicity pattern, unspecified headache type -Discussed red flags and if any changes to speech, vision, gait, or memory to go ED  Labs pending Health Maintenance reviewed Diet and exercise encouraged  Follow up plan: 3 months and follow up with Ortho   Evelina Dun, FNP

## 2018-04-08 ENCOUNTER — Other Ambulatory Visit: Payer: Self-pay | Admitting: Family

## 2018-04-08 DIAGNOSIS — M1990 Unspecified osteoarthritis, unspecified site: Secondary | ICD-10-CM

## 2018-04-28 ENCOUNTER — Encounter: Payer: Medicare Other | Admitting: Family

## 2018-05-06 ENCOUNTER — Other Ambulatory Visit: Payer: Self-pay | Admitting: Family

## 2018-05-06 DIAGNOSIS — I10 Essential (primary) hypertension: Secondary | ICD-10-CM

## 2018-05-17 DIAGNOSIS — I10 Essential (primary) hypertension: Secondary | ICD-10-CM | POA: Diagnosis not present

## 2018-05-17 DIAGNOSIS — R202 Paresthesia of skin: Secondary | ICD-10-CM | POA: Diagnosis not present

## 2018-05-17 DIAGNOSIS — R200 Anesthesia of skin: Secondary | ICD-10-CM | POA: Diagnosis not present

## 2018-05-17 DIAGNOSIS — K0510 Chronic gingivitis, plaque induced: Secondary | ICD-10-CM | POA: Diagnosis not present

## 2018-05-18 ENCOUNTER — Other Ambulatory Visit: Payer: Self-pay | Admitting: Family

## 2018-05-18 DIAGNOSIS — I10 Essential (primary) hypertension: Secondary | ICD-10-CM

## 2018-06-04 ENCOUNTER — Other Ambulatory Visit: Payer: Self-pay | Admitting: Family

## 2018-06-04 DIAGNOSIS — G2581 Restless legs syndrome: Secondary | ICD-10-CM

## 2018-06-12 ENCOUNTER — Other Ambulatory Visit: Payer: Self-pay | Admitting: Family

## 2018-06-12 DIAGNOSIS — M1990 Unspecified osteoarthritis, unspecified site: Secondary | ICD-10-CM

## 2018-06-18 ENCOUNTER — Encounter: Payer: Self-pay | Admitting: Family

## 2018-06-18 ENCOUNTER — Ambulatory Visit (INDEPENDENT_AMBULATORY_CARE_PROVIDER_SITE_OTHER): Payer: Medicare Other | Admitting: Family

## 2018-06-18 VITALS — BP 116/75 | HR 77 | Temp 97.4°F | Ht 69.0 in | Wt 333.8 lb

## 2018-06-18 DIAGNOSIS — M1711 Unilateral primary osteoarthritis, right knee: Secondary | ICD-10-CM | POA: Diagnosis not present

## 2018-06-18 DIAGNOSIS — J0101 Acute recurrent maxillary sinusitis: Secondary | ICD-10-CM

## 2018-06-18 DIAGNOSIS — E1165 Type 2 diabetes mellitus with hyperglycemia: Secondary | ICD-10-CM

## 2018-06-18 DIAGNOSIS — F1120 Opioid dependence, uncomplicated: Secondary | ICD-10-CM

## 2018-06-18 DIAGNOSIS — K219 Gastro-esophageal reflux disease without esophagitis: Secondary | ICD-10-CM | POA: Diagnosis not present

## 2018-06-18 DIAGNOSIS — G2581 Restless legs syndrome: Secondary | ICD-10-CM

## 2018-06-18 DIAGNOSIS — E1169 Type 2 diabetes mellitus with other specified complication: Secondary | ICD-10-CM

## 2018-06-18 DIAGNOSIS — I10 Essential (primary) hypertension: Secondary | ICD-10-CM

## 2018-06-18 DIAGNOSIS — M1712 Unilateral primary osteoarthritis, left knee: Secondary | ICD-10-CM | POA: Diagnosis not present

## 2018-06-18 DIAGNOSIS — E785 Hyperlipidemia, unspecified: Secondary | ICD-10-CM

## 2018-06-18 DIAGNOSIS — E1159 Type 2 diabetes mellitus with other circulatory complications: Secondary | ICD-10-CM | POA: Diagnosis not present

## 2018-06-18 DIAGNOSIS — Z0289 Encounter for other administrative examinations: Secondary | ICD-10-CM | POA: Insufficient documentation

## 2018-06-18 DIAGNOSIS — M1990 Unspecified osteoarthritis, unspecified site: Secondary | ICD-10-CM

## 2018-06-18 DIAGNOSIS — I152 Hypertension secondary to endocrine disorders: Secondary | ICD-10-CM

## 2018-06-18 MED ORDER — AMOXICILLIN-POT CLAVULANATE 875-125 MG PO TABS: 1.0000 | ORAL_TABLET | Freq: Two times a day (BID) | ORAL | 0 refills | Status: DC

## 2018-06-18 MED ORDER — TRAMADOL HCL 50 MG PO TABS: ORAL_TABLET | ORAL | 2 refills | Status: DC

## 2018-06-18 NOTE — Patient Instructions (Signed)

## 2018-06-18 NOTE — Progress Notes (Signed)
Subjective:    Patient ID: Ernest Martinez, male    DOB: November 22, 1955, 62 y.o.   MRN: [id number]  Chief Complaint  Patient presents with  . Diabetes    three month recheck   Pt presents to the office today for chronic follow up.  Arthritis  Presents for follow-up visit. He complains of pain and stiffness. The symptoms have been stable. Affected locations include the right knee and left knee. His pain is at a severity of 8/10.  Hypertension  This is a chronic problem. The current episode started more than 1 year ago. The problem has been resolved since onset. The problem is controlled. Associated symptoms include malaise/fatigue. Pertinent negatives include no headaches, peripheral edema or shortness of breath. Risk factors for coronary artery disease include dyslipidemia, diabetes mellitus, obesity and male gender. The current treatment provides moderate improvement. There is no history of kidney disease, CAD/MI, CVA or heart failure.  Hyperlipidemia  This is a chronic problem. The current episode started more than 1 year ago. The problem is controlled. Recent lipid tests were reviewed and are normal. Exacerbating diseases include obesity. Pertinent negatives include no shortness of breath. Current antihyperlipidemic treatment includes statins. The current treatment provides moderate improvement of lipids. Risk factors for coronary artery disease include dyslipidemia, obesity, male sex, hypertension and a sedentary lifestyle.  Gastroesophageal Reflux  He reports no belching, no coughing or no hoarse voice. This is a chronic problem. The current episode started more than 1 year ago. The problem occurs occasionally. The problem has been waxing and waning. Risk factors include obesity. He has tried a PPI for the symptoms. The treatment provided moderate relief.  Sinusitis  This is a recurrent problem. The current episode started more than 1 month ago. The problem has been waxing and waning since onset.  His pain is at a severity of 3/10. The pain is moderate. Associated symptoms include congestion, ear pain, sinus pressure and sneezing. Pertinent negatives include no coughing, headaches, hoarse voice or shortness of breath. Past treatments include oral decongestants and antibiotics. The treatment provided mild relief.  RLS Pt taking Requip 1 mg every night, states this helps greatly.     Review of Systems  Constitutional: Positive for malaise/fatigue.  HENT: Positive for congestion, ear pain, sinus pressure and sneezing. Negative for hoarse voice.   Respiratory: Negative for cough and shortness of breath.   Musculoskeletal: Positive for arthritis and stiffness.  Neurological: Negative for headaches.  All other systems reviewed and are negative.      Objective:   Physical Exam  Constitutional: He is oriented to person, place, and time. He appears well-developed and well-nourished. No distress.  Morbid obese  HENT:  Head: Normocephalic.  Right Ear: External ear normal.  Left Ear: External ear normal.  Nose: Rhinorrhea present. Right sinus exhibits maxillary sinus tenderness. Left sinus exhibits maxillary sinus tenderness.  Mouth/Throat: Oropharynx is clear and moist.  Eyes: Pupils are equal, round, and reactive to light. Right eye exhibits no discharge. Left eye exhibits no discharge.  Neck: Normal range of motion. Neck supple. No thyromegaly present.  Cardiovascular: Normal rate, regular rhythm, normal heart sounds and intact distal pulses.  No murmur heard. Pulmonary/Chest: Effort normal and breath sounds normal. No respiratory distress. He has no wheezes.  Abdominal: Soft. Bowel sounds are normal. He exhibits no distension. There is no tenderness.  Musculoskeletal: He exhibits no edema or tenderness.  Pain in bilateral knees with flexion and extension  Neurological: He is alert and oriented  to person, place, and time. He has normal reflexes. No cranial nerve deficit.  Skin: Skin  is warm and dry. No rash noted. No erythema.  Psychiatric: He has a normal mood and affect. His behavior is normal. Judgment and thought content normal.  Vitals reviewed.     BP 116/75   Pulse 77   Temp (!) 97.4 F (36.3 C) (Oral)   Ht 5' 9"  (1.753 m)   Wt (!) 333 lb 12.8 oz (151.4 kg)   BMI 49.29 kg/m      Assessment & Plan:  Ernest Martinez comes in today with chief complaint of Diabetes (three month recheck)   Diagnosis and orders addressed:  1. Arthritis -PT to follow up with Ortho today for bilateral knee injections  - CBC with Differential/Platelet - TSH - traMADol (ULTRAM) 50 MG tablet; TAKE 1 TO 2 TABLETS EVERY 8 HOURS AS NEEDED FOR PAIN  Dispense: 120 tablet; Refill: 2 - CMP14+EGFR - ToxASSURE Select 13 (MW), Urine  2. Type 2 diabetes mellitus with hyperglycemia, without long-term current use of insulin (HCC) - CBC with Differential/Platelet - TSH - CMP14+EGFR  3. Gastroesophageal reflux disease, esophagitis presence not specified - CBC with Differential/Platelet - CMP14+EGFR  4. Hypertension associated with diabetes ([place]) - CBC with Differential/Platelet - CMP14+EGFR  5. Hyperlipidemia associated with type 2 diabetes mellitus ([place]) - CBC with Differential/Platelet - CMP14+EGFR - Lipid panel  6. Morbid obesity ([place]) - CBC with Differential/Platelet - TSH - CMP14+EGFR  7. Pain management contract signed - CMP14+EGFR - ToxASSURE Select 13 (MW), Urine  8. Uncomplicated opioid dependence ([place]) - CMP14+EGFR - ToxASSURE Select 13 (MW), Urine  9. RLS (restless legs syndrome) - CMP14+EGFR  10. Acute recurrent maxillary sinusitis - Take meds as prescribed - Use a cool mist humidifier  -Use saline nose sprays frequently -Force fluids -For any cough or congestion  Use plain Mucinex- regular strength or max strength is fine -For fever or aces or pains- take tylenol or ibuprofen. -Throat lozenges if help - amoxicillin-clavulanate (AUGMENTIN) 875-125  MG tablet; Take 1 tablet by mouth 2 (two) times daily.  Dispense: 14 tablet; Refill: 0   Labs pending Health Maintenance reviewed Diet and exercise encouraged  Follow up plan: 3 months   Evelina Dun, FNP

## 2018-06-19 LAB — CMP14+EGFR
A/G RATIO: 1.5 (ref 1.2–2.2)
ALK PHOS: 62 IU/L (ref 39–117)
ALT: 13 IU/L (ref 0–44)
AST: 14 IU/L (ref 0–40)
Albumin: 4 g/dL (ref 3.6–4.8)
BUN/Creatinine Ratio: 14 (ref 10–24)
BUN: 13 mg/dL (ref 8–27)
Bilirubin Total: 0.4 mg/dL (ref 0.0–1.2)
CALCIUM: 9.5 mg/dL (ref 8.6–10.2)
CO2: 25 mmol/L (ref 20–29)
Chloride: 98 mmol/L (ref 96–106)
Creatinine, Ser: 0.91 mg/dL (ref 0.76–1.27)
GFR calc Af Amer: 105 mL/min/{1.73_m2} (ref >59)
GFR calc non Af Amer: 91 mL/min/{1.73_m2} (ref >59)
Globulin, Total: 2.7 g/dL (ref 1.5–4.5)
Glucose: 113 mg/dL — ABNORMAL HIGH (ref 65–99)
POTASSIUM: 3.9 mmol/L (ref 3.5–5.2)
SODIUM: 142 mmol/L (ref 134–144)
Total Protein: 6.7 g/dL (ref 6.0–8.5)

## 2018-06-19 LAB — CBC WITH DIFFERENTIAL/PLATELET
BASOS ABS: 0.1 10*3/uL (ref 0.0–0.2)
BASOS: 1 %
EOS (ABSOLUTE): 0.2 10*3/uL (ref 0.0–0.4)
Eos: 2 %
Hematocrit: 41.2 % (ref 37.5–51.0)
Hemoglobin: 14 g/dL (ref 13.0–17.7)
IMMATURE GRANS (ABS): 0.1 10*3/uL (ref 0.0–0.1)
IMMATURE GRANULOCYTES: 1 %
LYMPHS: 22 %
Lymphocytes Absolute: 2.4 10*3/uL (ref 0.7–3.1)
MCH: 30 pg (ref 26.6–33.0)
MCHC: 34 g/dL (ref 31.5–35.7)
MCV: 88 fL (ref 79–97)
MONOS ABS: 0.8 10*3/uL (ref 0.1–0.9)
Monocytes: 7 %
Neutrophils Absolute: 7.4 10*3/uL — ABNORMAL HIGH (ref 1.4–7.0)
Neutrophils: 67 %
PLATELETS: 294 10*3/uL (ref 150–450)
RBC: 4.67 x10E6/uL (ref 4.14–5.80)
RDW: 11.6 % — AB (ref 12.3–15.4)
WBC: 10.8 10*3/uL (ref 3.4–10.8)

## 2018-06-19 LAB — LIPID PANEL
CHOL/HDL RATIO: 3.2 ratio (ref 0.0–5.0)
CHOLESTEROL TOTAL: 167 mg/dL (ref 100–199)
HDL: 53 mg/dL (ref >39)
LDL Calculated: 99 mg/dL (ref 0–99)
TRIGLYCERIDES: 75 mg/dL (ref 0–149)
VLDL Cholesterol Cal: 15 mg/dL (ref 5–40)

## 2018-06-19 LAB — TSH: TSH: 0.065 u[IU]/mL — AB (ref 0.450–4.500)

## 2018-06-24 LAB — TOXASSURE SELECT 13 (MW), URINE

## 2018-07-01 ENCOUNTER — Ambulatory Visit (INDEPENDENT_AMBULATORY_CARE_PROVIDER_SITE_OTHER): Payer: Medicare Other | Admitting: *Deleted

## 2018-07-01 ENCOUNTER — Encounter: Payer: Self-pay | Admitting: *Deleted

## 2018-07-01 VITALS — BP 131/82 | HR 60 | Ht 69.0 in | Wt 333.0 lb

## 2018-07-01 DIAGNOSIS — Z0000 Encounter for general adult medical examination without abnormal findings: Secondary | ICD-10-CM | POA: Diagnosis not present

## 2018-07-01 NOTE — Progress Notes (Signed)
Subjective:   Ernest Martinez is a 62 y.o. male who presents for an Initial Medicare Annual Wellness Visit.  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  He enjoys working around his home and watching television.  He lives at home with his wife and outdoor cats.  Mr. Thau has one son and a step daughter.  He feels his health is better this year than last year because his knee pain is slightly improved due to taking Celebrex.  He reports no hospitalizations, ER visits, or surgeries in the past year.     Review of Systems  Musculoskeletal - bilateral knee pain All other systems negative       Objective:    Today's Vitals   07/01/18 0845  BP: 131/82  Pulse: 60  Weight: (!) 333 lb (151 kg)  Height: 5\' 9"  (1.753 m)  PainSc: 8   PainLoc: Knee   Body mass index is 49.18 kg/m.  Advanced Directives 07/01/2018 03/06/2017 10/25/2014 08/10/2013 03/28/2013  Does Patient Have a Medical Advance Directive? No No No Patient does not have advance directive Patient does not have advance directive  Would patient like information on creating a medical advance directive? Yes (MAU/Ambulatory/Procedural Areas - Information given) - - - -  Pre-existing out of facility DNR order (yellow form or pink MOST form) - - - No -    Current Medications (verified) Outpatient Encounter Medications as of 07/01/2018  Medication Sig  . amLODipine (NORVASC) 10 MG tablet TAKE ONE TABLET EVERY MORNING  . amoxicillin-clavulanate (AUGMENTIN) 875-125 MG tablet Take 1 tablet by mouth 2 (two) times daily.  . celecoxib (CELEBREX) 200 MG capsule TAKE ONE (1) CAPSULE EACH DAY  . furosemide (LASIX) 20 MG tablet TAKE ONE TABLET EVERY MORNING  . lisinopril-hydrochlorothiazide (PRINZIDE,ZESTORETIC) 20-12.5 MG tablet TAKE ONE (1) TABLET EACH DAY  . metFORMIN (GLUCOPHAGE) 1000 MG tablet TAKE ONE TABLET TWICE A DAY WITH FOOD  . omeprazole (PRILOSEC) 20 MG capsule TAKE ONE CAPSULE EACH  MORNING  . rOPINIRole (REQUIP) 1 MG tablet TAKE ONE TABLET DAILY AT BEDTIME  . traMADol (ULTRAM) 50 MG tablet TAKE 1 TO 2 TABLETS EVERY 8 HOURS AS NEEDED FOR PAIN  . aspirin EC 81 MG tablet Take 1 tablet (81 mg total) by mouth daily. (Patient not taking: Reported on 07/01/2018)  . atorvastatin (LIPITOR) 10 MG tablet Take 1 tablet (10 mg total) by mouth daily. (Patient not taking: Reported on 07/01/2018)   No facility-administered encounter medications on file as of 07/01/2018.     Allergies (verified) Lovastatin and Metoprolol tartrate   History: Past Medical History:  Diagnosis Date  . Abnormal EKG   . Bacteremia due to Gram-negative bacteria    a. 03/2013: klebsiella UTI with bacteremia.  . Bladder filling defect    a. 03/2013: Filling defect at base of bladder by CT pelvis, for f/u urology.  . Borderline diabetes   . Dislocation of right shoulder joint sept 2014   after fall  . GERD (gastroesophageal reflux disease)   . Hydrocele of testis    a. 03/2013: US showing Large right testicular hydrocele and small left hydrocele.  Marland Kitchen Hypertension   . Morbid obesity ([place])    Past Surgical History:  Procedure Laterality Date  . HYDROCELE EXCISION Right 08/16/2013   Procedure: HYDROCELECTOMY ADULT  Procedure: Right Scrotal Hydrocele Repair;  Surgeon: Bernestine Amass, MD;  Location: [facility];  Service: Urology;  Laterality: Right;  .  None     Family History  Problem Relation Age of Onset  . Cancer Father        prostate cancer  . CAD Neg Hx    Social History   Socioeconomic History  . Marital status: Married    Spouse name: Not on file  . Number of children: 2  . Years of education: Not on file  . Highest education level: Not on file  Occupational History  . Occupation: Merchandiser, retail: SELF  Social Needs  . Financial resource strain: Not on file  . Food insecurity:    Worry: Not on file    Inability: Not on file  . Transportation needs:    Medical: Not on file     Non-medical: Not on file  Tobacco Use  . Smoking status: Never Smoker  . Smokeless tobacco: Never Used  Substance and Sexual Activity  . Alcohol use: Yes    Comment: socially  . Drug use: No  . Sexual activity: Not on file  Lifestyle  . Physical activity:    Days per week: Not on file    Minutes per session: Not on file  . Stress: Not on file  Relationships  . Social connections:    Talks on phone: Not on file    Gets together: Not on file    Attends religious service: Not on file    Active member of club or organization: Not on file    Attends meetings of clubs or organizations: Not on file    Relationship status: Not on file  Other Topics Concern  . Not on file  Social History Narrative  . Not on file   Tobacco Counseling Never smoker- not counseled  Clinical Intake:     Pain Score: 8                  Activities of Daily Living In your present state of health, do you have any difficulty performing the following activities: 07/01/2018  Hearing? N  Vision? N  Difficulty concentrating or making decisions? N  Walking or climbing stairs? Y  Comment knee pain  Dressing or bathing? N  Doing errands, shopping? N  Preparing Food and eating ? N  Using the Toilet? N  In the past six months, have you accidently leaked urine? N  Do you have problems with loss of bowel control? N  Managing your Medications? N  Managing your Finances? N  Comment Wife manages most of finances  Housekeeping or managing your Housekeeping? N  Some recent data might be hidden     Immunizations and Health Maintenance  There is no immunization history on file for this patient. Health Maintenance Due  Topic Date Due  . PNEUMOCOCCAL POLYSACCHARIDE VACCINE AGE 48-64 HIGH RISK  07/28/1958  . COLON CANCER SCREENING ANNUAL FOBT  07/28/2006  . COLONOSCOPY  07/28/2006  . OPHTHALMOLOGY EXAM  10/01/2017   Patient declined prevnar 13 States he has FOBT and cologuard tests at home,  encouraged him to complete and submit them as soon as possible.  Patient declined colonoscopy referral He plans to call to schedule eye exam with [facility] in [place], [place]     Patient Care Team: Sharion Balloon, FNP as PCP - General (Family Medicine)      Assessment:   This is a routine wellness examination for [place].  Hearing/Vision screen No hearing deficit noted, and patient states he feels he hears well. States he sees well, uses  over the counter reading glasses to read fine print.  Dietary issues and exercise activities discussed:  Mr. Delone states he eats 3 meals per day and snacks as needed.  He tries to moderate his carbohydrate intake, and states he has lost 60 lbs in recent years.  Encouraged a diet of mostly vegetables, lean proteins, and fruits and whole grains in moderation.   Current Exercise Habits: Home exercise routine, Type of exercise: walking, Time (Minutes): 10, Frequency (Times/Week): 3, Weekly Exercise (Minutes/Week): 30, Intensity: Mild, Exercise limited by: orthopedic condition(s)  Goals    . Exercise 3x per week (30 min per time)     Stationary bike and walking are great options.  Patient plans to check on Silver sneakers at the [facility].       Depression Screen PHQ 2/9 Scores 07/01/2018 06/18/2018 03/16/2018 12/11/2017  PHQ - 2 Score 0 0 0 0    Fall Risk Fall Risk  07/01/2018 03/16/2018 12/11/2017 09/12/2017 06/13/2017  Falls in the past year? No No No No No    Is the patient's home free of loose throw rugs in walkways, pet beds, electrical cords, etc?   yes      Grab bars in the bathroom? no      Handrails on the stairs?   no stairs in home      Adequate lighting?   yes    Cognitive Function: MMSE - Mini Mental State Exam 07/01/2018  Orientation to time 5  Orientation to Place 5  Registration 3  Attention/ Calculation 5  Recall 2  Language- name 2 objects 2  Language- repeat 1  Language- follow 3 step command 3  Language- read & follow  direction 1  Write a sentence 1  Copy design 1  Total score 29        Screening Tests Health Maintenance  Topic Date Due  . PNEUMOCOCCAL POLYSACCHARIDE VACCINE AGE 45-64 HIGH RISK  07/28/1958  . COLON CANCER SCREENING ANNUAL FOBT  07/28/2006  . COLONOSCOPY  07/28/2006  . OPHTHALMOLOGY EXAM  10/01/2017  . INFLUENZA VACCINE  02/10/2019 (Originally 04/23/2018)  . FOOT EXAM  09/12/2018  . HEMOGLOBIN A1C  09/15/2018  . TETANUS/TDAP  07/12/2025  . Hepatitis C Screening  Completed  . HIV Screening  Completed   Patient declined prevnar 83 States he has FOBT and cologuard tests at home, encouraged him to complete and submit them as soon as possible.  Patient declined colonoscopy referral He plans to call to schedule eye exam with [facility] in [place], [place]   Qualifies for Shingles Vaccine? Declined   Cancer Screenings: Lung: Low Dose CT Chest recommended if Age 57-80 years, 30 pack-year currently smoking OR have quit w/in 15years. Patient does not qualify. Colorectal: encouraged patient to complete Cologuard, he declined colonoscopy  Additional Screenings:  Hepatitis C Screening:  Completed 09/09/2016      Plan:   Work on goal of increasing your exercise to 3 times per week for 30 minutes each session.  Walking and stationary bicycle are great options.  [facility].  Review the information given on Advance Directives and if you complete these please bring a copy to our office to be filed in your chart.   Complete your Cologuard test and mail back to the company to check for colon cancer.  Consider getting the flu vaccine, Prevnar 13 (pneumonia vaccine), and Shingrix (Shingles vaccine).  Follow up with Evelina Dun, FNP as directed.    I  have personally reviewed and noted the following in the patient's chart:   . Medical and social history . Use of alcohol, tobacco or illicit drugs  . Current medications and supplements . Functional  ability and status . Nutritional status . Physical activity . Advanced directives . List of other physicians . Hospitalizations, surgeries, and ER visits in previous 12 months . Vitals . Screenings to include cognitive, depression, and falls . Referrals and appointments  In addition, I have reviewed and discussed with patient certain preventive protocols, quality metrics, and best practice recommendations. A written personalized care plan for preventive services as well as general preventive health recommendations were provided to patient.     Denyce Robert, RN   07/01/2018

## 2018-07-01 NOTE — Patient Instructions (Signed)
Please work on your goal of increasing your exercise to 3 times per week for 30 minutes each session.  Walking and stationary bicycle are great options.  Also, check into the [employer] program at the [facility].   Please review the information given on Advance Directives and if you complete these please bring a copy to our office to be filed in your chart.    Please complete your Cologuard test and mail back to the company to check for colon cancer.   Consider getting the flu vaccine, Prevnar 13 (pneumonia vaccine), and Shingrix (Shingles vaccine).   Thank you for coming in for your Annual Wellness Visit today!!  Preventive Care 40-64 Years, Male Preventive care refers to lifestyle choices and visits with your health care provider that can promote health and wellness. What does preventive care include?  A yearly physical exam. This is also called an annual well check.  Dental exams once or twice a year.  Routine eye exams. Ask your health care provider how often you should have your eyes checked.  Personal lifestyle choices, including: ? Daily care of your teeth and gums. ? Regular physical activity. ? Eating a healthy diet. ? Avoiding tobacco and drug use. ? Limiting alcohol use. ? Practicing safe sex. ? Taking low-dose aspirin every day starting at age 39. What happens during an annual well check? The services and screenings done by your health care provider during your annual well check will depend on your age, overall health, lifestyle risk factors, and family history of disease. Counseling Your health care provider may ask you questions about your:  Alcohol use.  Tobacco use.  Drug use.  Emotional well-being.  Home and relationship well-being.  Sexual activity.  Eating habits.  Work and work Statistician.  Screening You may have the following tests or measurements:  Height, weight, and BMI.  Blood pressure.  Lipid and cholesterol levels. These may be  checked every 5 years, or more frequently if you are over 34 years old.  Skin check.  Lung cancer screening. You may have this screening every year starting at age 63 if you have a 30-pack-year history of smoking and currently smoke or have quit within the past 15 years.  Fecal occult blood test (FOBT) of the stool. You may have this test every year starting at age 4.  Flexible sigmoidoscopy or colonoscopy. You may have a sigmoidoscopy every 5 years or a colonoscopy every 10 years starting at age 37.  Prostate cancer screening. Recommendations will vary depending on your family history and other risks.  Hepatitis C blood test.  Hepatitis B blood test.  Sexually transmitted disease (STD) testing.  Diabetes screening. This is done by checking your blood sugar (glucose) after you have not eaten for a while (fasting). You may have this done every 1-3 years.  Discuss your test results, treatment options, and if necessary, the need for more tests with your health care provider. Vaccines Your health care provider may recommend certain vaccines, such as:  Influenza vaccine. This is recommended every year.  Tetanus, diphtheria, and acellular pertussis (Tdap, Td) vaccine. You may need a Td booster every 10 years.  Varicella vaccine. You may need this if you have not been vaccinated.  Zoster vaccine. You may need this after age 38.  Measles, mumps, and rubella (MMR) vaccine. You may need at least one dose of MMR if you were born in 1957 or later. You may also need a second dose.  Pneumococcal 13-valent conjugate (PCV13)  vaccine. You may need this if you have certain conditions and have not been vaccinated.  Pneumococcal polysaccharide (PPSV23) vaccine. You may need one or two doses if you smoke cigarettes or if you have certain conditions.  Meningococcal vaccine. You may need this if you have certain conditions.  Hepatitis A vaccine. You may need this if you have certain conditions or  if you travel or work in places where you may be exposed to hepatitis A.  Hepatitis B vaccine. You may need this if you have certain conditions or if you travel or work in places where you may be exposed to hepatitis B.  Haemophilus influenzae type b (Hib) vaccine. You may need this if you have certain risk factors.  Talk to your health care provider about which screenings and vaccines you need and how often you need them. This information is not intended to replace advice given to you by your health care provider. Make sure you discuss any questions you have with your health care provider. Document Released: 10/06/2015 Document Revised: 05/29/2016 Document Reviewed: 07/11/2015 Elsevier Interactive Patient Education  [employer].

## 2018-07-03 ENCOUNTER — Ambulatory Visit: Payer: Medicare Other | Admitting: *Deleted

## 2018-07-08 ENCOUNTER — Other Ambulatory Visit: Payer: Self-pay | Admitting: Family

## 2018-07-08 DIAGNOSIS — M1990 Unspecified osteoarthritis, unspecified site: Secondary | ICD-10-CM

## 2018-07-08 DIAGNOSIS — K219 Gastro-esophageal reflux disease without esophagitis: Secondary | ICD-10-CM

## 2018-07-27 ENCOUNTER — Ambulatory Visit (INDEPENDENT_AMBULATORY_CARE_PROVIDER_SITE_OTHER): Payer: Medicare Other | Admitting: Family Medicine

## 2018-07-27 ENCOUNTER — Encounter: Payer: Self-pay | Admitting: Family Medicine

## 2018-07-27 VITALS — BP 128/77 | HR 76 | Temp 97.1°F | Wt 332.6 lb

## 2018-07-27 DIAGNOSIS — J0100 Acute maxillary sinusitis, unspecified: Secondary | ICD-10-CM

## 2018-07-27 DIAGNOSIS — M170 Bilateral primary osteoarthritis of knee: Secondary | ICD-10-CM | POA: Diagnosis not present

## 2018-07-27 MED ORDER — PSEUDOEPHEDRINE-GUAIFENESIN ER 120-1200 MG PO TB12: 1.0000 | ORAL_TABLET | Freq: Two times a day (BID) | ORAL | 0 refills | Status: DC

## 2018-07-27 MED ORDER — AMOXICILLIN-POT CLAVULANATE 875-125 MG PO TABS: 1.0000 | ORAL_TABLET | Freq: Two times a day (BID) | ORAL | 0 refills | Status: DC

## 2018-07-27 NOTE — Addendum Note (Signed)
Addended by: Claretta Fraise on: 07/27/2018 03:07 PM   Modules accepted: Orders

## 2018-07-27 NOTE — Progress Notes (Addendum)
Chief Complaint  Patient presents with  . Sinusitis    HPI  Patient presents today for Patient presents with upper respiratory congestion. FAce pain and pressure. There is moderate sore throat. Patient reports coughing frequently as well.  No sputum noted. There is no fever, chills or sweats. The patient denies being short of breath. Onset was 3-5 days ago. Gradually worsening. Tried OTCs without improvement.  PMH: Smoking status noted ROS: Per HPI  Objective: BP 128/77 (BP Location: Left Arm)   Pulse 76   Temp (!) 97.1 F (36.2 C)   Wt (!) 332 lb 9.6 oz (150.9 kg)   BMI 49.12 kg/m  Gen: NAD, alert, cooperative with exam HEENT: NCAT, Nasal passages swollen, red TMS clear nasal passages swollen. Max sinuses tender bilat. CV: RRR, good S1/S2, no murmur Resp: Bronchitis changes with scattered wheezes, non-labored Ext: No edema, warm Neuro: Alert and oriented, No gross deficits  Assessment and plan:  1. Acute maxillary sinusitis, recurrence not specified     Meds ordered this encounter  Medications  . amoxicillin-clavulanate (AUGMENTIN) 875-125 MG tablet    Sig: Take 1 tablet by mouth 2 (two) times daily. Take all of this medication    Dispense:  20 tablet    Refill:  0  . Pseudoephedrine-Guaifenesin [phone] MG TB12    Sig: Take 1 tablet by mouth 2 (two) times daily. For congestion    Dispense:  20 each    Refill:  0    No orders of the defined types were placed in this encounter.   Follow up as needed.  Claretta Fraise, MD

## 2018-08-03 DIAGNOSIS — M1711 Unilateral primary osteoarthritis, right knee: Secondary | ICD-10-CM | POA: Diagnosis not present

## 2018-08-03 DIAGNOSIS — M1712 Unilateral primary osteoarthritis, left knee: Secondary | ICD-10-CM | POA: Diagnosis not present

## 2018-08-05 ENCOUNTER — Other Ambulatory Visit: Payer: Self-pay | Admitting: Family

## 2018-08-05 DIAGNOSIS — I10 Essential (primary) hypertension: Secondary | ICD-10-CM

## 2018-08-10 DIAGNOSIS — M1712 Unilateral primary osteoarthritis, left knee: Secondary | ICD-10-CM | POA: Diagnosis not present

## 2018-08-10 DIAGNOSIS — M1711 Unilateral primary osteoarthritis, right knee: Secondary | ICD-10-CM | POA: Diagnosis not present

## 2018-08-21 ENCOUNTER — Other Ambulatory Visit: Payer: Self-pay | Admitting: Family

## 2018-08-21 DIAGNOSIS — G2581 Restless legs syndrome: Secondary | ICD-10-CM

## 2018-08-21 DIAGNOSIS — I10 Essential (primary) hypertension: Secondary | ICD-10-CM

## 2018-09-17 ENCOUNTER — Ambulatory Visit (INDEPENDENT_AMBULATORY_CARE_PROVIDER_SITE_OTHER): Payer: Medicare Other | Admitting: Family

## 2018-09-17 ENCOUNTER — Encounter: Payer: Self-pay | Admitting: Family

## 2018-09-17 VITALS — BP 131/82 | HR 75 | Temp 97.0°F | Ht 69.0 in | Wt 335.2 lb

## 2018-09-17 DIAGNOSIS — E1159 Type 2 diabetes mellitus with other circulatory complications: Secondary | ICD-10-CM

## 2018-09-17 DIAGNOSIS — F1120 Opioid dependence, uncomplicated: Secondary | ICD-10-CM

## 2018-09-17 DIAGNOSIS — E1165 Type 2 diabetes mellitus with hyperglycemia: Secondary | ICD-10-CM | POA: Diagnosis not present

## 2018-09-17 DIAGNOSIS — M1990 Unspecified osteoarthritis, unspecified site: Secondary | ICD-10-CM

## 2018-09-17 DIAGNOSIS — E1169 Type 2 diabetes mellitus with other specified complication: Secondary | ICD-10-CM | POA: Diagnosis not present

## 2018-09-17 DIAGNOSIS — G2581 Restless legs syndrome: Secondary | ICD-10-CM

## 2018-09-17 DIAGNOSIS — Z0289 Encounter for other administrative examinations: Secondary | ICD-10-CM

## 2018-09-17 DIAGNOSIS — Z1211 Encounter for screening for malignant neoplasm of colon: Secondary | ICD-10-CM

## 2018-09-17 DIAGNOSIS — E785 Hyperlipidemia, unspecified: Secondary | ICD-10-CM

## 2018-09-17 DIAGNOSIS — Z1212 Encounter for screening for malignant neoplasm of rectum: Secondary | ICD-10-CM

## 2018-09-17 DIAGNOSIS — I440 Atrioventricular block, first degree: Secondary | ICD-10-CM

## 2018-09-17 DIAGNOSIS — I152 Hypertension secondary to endocrine disorders: Secondary | ICD-10-CM

## 2018-09-17 DIAGNOSIS — K219 Gastro-esophageal reflux disease without esophagitis: Secondary | ICD-10-CM

## 2018-09-17 DIAGNOSIS — I10 Essential (primary) hypertension: Secondary | ICD-10-CM

## 2018-09-17 LAB — BAYER DCA HB A1C WAIVED: HB A1C (BAYER DCA - WAIVED): 5.9 % (ref <7.0)

## 2018-09-17 MED ORDER — TRAMADOL HCL 50 MG PO TABS: ORAL_TABLET | ORAL | 2 refills | Status: DC

## 2018-09-17 NOTE — Patient Instructions (Signed)

## 2018-09-17 NOTE — Progress Notes (Signed)
Subjective:    Patient ID: Ernest Martinez, male    DOB: 10/21/1955, 62 y.o.   MRN: [id number]  Chief Complaint  Patient presents with  . Medical Management of Chronic Issues    PT presents to the office today for chronic follow up and pain medication refill.  Diabetes  He presents for his follow-up diabetic visit. He has type 2 diabetes mellitus. His disease course has been stable. There are no hypoglycemic associated symptoms. Pertinent negatives for diabetes include no blurred vision, no foot paresthesias and no visual change. Symptoms are stable. Diabetic complications include heart disease. Pertinent negatives for diabetic complications include no CVA. Risk factors for coronary artery disease include dyslipidemia, diabetes mellitus, hypertension, male sex and sedentary lifestyle. (Does not check BS at home) Eye exam is not current.  Hypertension  This is a chronic problem. The current episode started more than 1 year ago. The problem has been resolved since onset. The problem is controlled. Pertinent negatives include no blurred vision, malaise/fatigue, peripheral edema or shortness of breath. The current treatment provides moderate improvement. There is no history of CAD/MI or CVA.  Hyperlipidemia  This is a chronic problem. The current episode started more than 1 year ago. The problem is controlled. Recent lipid tests were reviewed and are normal. Pertinent negatives include no shortness of breath. Current antihyperlipidemic treatment includes statins. The current treatment provides moderate improvement of lipids. Risk factors for coronary artery disease include dyslipidemia, male sex, hypertension and a sedentary lifestyle.  Arthritis  Presents for follow-up visit. He complains of pain and stiffness. The symptoms have been stable. Affected locations include the right knee and left knee. His pain is at a severity of 8/10.  Gastroesophageal Reflux  He complains of heartburn. He reports no  belching or no coughing. This is a chronic problem. The current episode started more than 1 year ago. The problem occurs occasionally. He has tried a PPI for the symptoms. The treatment provided moderate relief.  RSL  Pt taking Requip and states this has greatly helped the jerking and moving of his legs at night.     Review of Systems  Constitutional: Negative for malaise/fatigue.  Eyes: Negative for blurred vision.  Respiratory: Negative for cough and shortness of breath.   Gastrointestinal: Positive for heartburn.  Musculoskeletal: Positive for arthralgias, arthritis and stiffness.  All other systems reviewed and are negative.      Objective:   Physical Exam Vitals signs reviewed.  Constitutional:      General: He is not in acute distress.    Appearance: He is well-developed.     Comments: Morbid obese  HENT:     Head: Normocephalic.     Right Ear: External ear normal.     Left Ear: External ear normal.  Eyes:     General:        Right eye: No discharge.        Left eye: No discharge.     Pupils: Pupils are equal, round, and reactive to light.  Neck:     Musculoskeletal: Normal range of motion and neck supple.     Thyroid: No thyromegaly.  Cardiovascular:     Rate and Rhythm: Normal rate and regular rhythm.     Heart sounds: Normal heart sounds. No murmur.  Pulmonary:     Effort: Pulmonary effort is normal. No respiratory distress.     Breath sounds: Normal breath sounds. No wheezing.  Abdominal:     General: Bowel sounds are  normal. There is no distension.     Palpations: Abdomen is soft.     Tenderness: There is no abdominal tenderness.  Musculoskeletal: Normal range of motion.        General: No tenderness.     Comments: Mild Pain in bilateral knees with flexion  Skin:    General: Skin is warm and dry.     Findings: No erythema or rash.  Neurological:     Mental Status: He is alert and oriented to person, place, and time.     Cranial Nerves: No cranial nerve  deficit.     Deep Tendon Reflexes: Reflexes are normal and symmetric.  Psychiatric:        Behavior: Behavior normal.        Thought Content: Thought content normal.        Judgment: Judgment normal.       BP 131/82   Pulse 75   Temp (!) 97 F (36.1 C) (Oral)   Ht 5' 9"  (1.753 m)   Wt (!) 335 lb 3.2 oz (152 kg)   BMI 49.50 kg/m      Assessment & Plan:  Ernest Martinez comes in today with chief complaint of Medical Management of Chronic Issues   Diagnosis and orders addressed:  1. Hypertension associated with diabetes ([place]) - CMP14+EGFR  2. Type 2 diabetes mellitus with hyperglycemia, without long-term current use of insulin (HCC) - Bayer DCA Hb A1c Waived - CMP14+EGFR  3. Hyperlipidemia associated with type 2 diabetes mellitus (HCC) - CMP14+EGFR  4. Arthritis - CMP14+EGFR - traMADol (ULTRAM) 50 MG tablet; TAKE 1 TO 2 TABLETS EVERY 8 HOURS AS NEEDED FOR PAIN  Dispense: 120 tablet; Refill: 2  5. Morbid obesity ([place]) - CMP14+EGFR  6. Pain management contract signed - CMP14+EGFR - traMADol (ULTRAM) 50 MG tablet; TAKE 1 TO 2 TABLETS EVERY 8 HOURS AS NEEDED FOR PAIN  Dispense: 120 tablet; Refill: 2  7. Uncomplicated opioid dependence (HCC) - CMP14+EGFR - traMADol (ULTRAM) 50 MG tablet; TAKE 1 TO 2 TABLETS EVERY 8 HOURS AS NEEDED FOR PAIN  Dispense: 120 tablet; Refill: 2  8. 1st degree AV block - CMP14+EGFR  9. Gastroesophageal reflux disease, esophagitis presence not specified - CMP14+EGFR  10. RLS (restless legs syndrome) - CMP14+EGFR  11. Colon cancer screening - CMP14+EGFR - Cologuard  12. Screening for malignant neoplasm of the rectum - CMP14+EGFR - Cologuard   Labs pending Health Maintenance reviewed Diet and exercise encouraged  Follow up plan: 3 months   Evelina Dun, FNP

## 2018-09-18 LAB — CMP14+EGFR
A/G RATIO: 1.5 (ref 1.2–2.2)
ALBUMIN: 3.8 g/dL (ref 3.6–4.8)
ALT: 13 IU/L (ref 0–44)
AST: 15 IU/L (ref 0–40)
Alkaline Phosphatase: 60 IU/L (ref 39–117)
BILIRUBIN TOTAL: 0.3 mg/dL (ref 0.0–1.2)
BUN / CREAT RATIO: 11 (ref 10–24)
BUN: 10 mg/dL (ref 8–27)
CHLORIDE: 100 mmol/L (ref 96–106)
CO2: 27 mmol/L (ref 20–29)
Calcium: 9.2 mg/dL (ref 8.6–10.2)
Creatinine, Ser: 0.94 mg/dL (ref 0.76–1.27)
GFR calc Af Amer: 100 mL/min/{1.73_m2} (ref >59)
GFR, EST NON AFRICAN AMERICAN: 87 mL/min/{1.73_m2} (ref >59)
GLOBULIN, TOTAL: 2.6 g/dL (ref 1.5–4.5)
Glucose: 91 mg/dL (ref 65–99)
POTASSIUM: 3.7 mmol/L (ref 3.5–5.2)
SODIUM: 139 mmol/L (ref 134–144)
Total Protein: 6.4 g/dL (ref 6.0–8.5)

## 2018-09-28 DIAGNOSIS — M1711 Unilateral primary osteoarthritis, right knee: Secondary | ICD-10-CM | POA: Diagnosis not present

## 2018-09-28 DIAGNOSIS — Z6841 Body Mass Index (BMI) 40.0 and over, adult: Secondary | ICD-10-CM | POA: Diagnosis not present

## 2018-09-28 DIAGNOSIS — M1712 Unilateral primary osteoarthritis, left knee: Secondary | ICD-10-CM | POA: Diagnosis not present

## 2018-10-07 ENCOUNTER — Other Ambulatory Visit: Payer: Self-pay | Admitting: Family

## 2018-10-07 DIAGNOSIS — M1990 Unspecified osteoarthritis, unspecified site: Secondary | ICD-10-CM

## 2018-10-20 ENCOUNTER — Ambulatory Visit: Payer: Self-pay | Admitting: Family Medicine

## 2018-10-20 ENCOUNTER — Encounter: Payer: Self-pay | Admitting: Family Medicine

## 2018-10-20 VITALS — BP 130/76 | HR 78 | Temp 98.4°F | Ht 69.0 in | Wt 336.0 lb

## 2018-10-20 DIAGNOSIS — Z024 Encounter for examination for driving license: Secondary | ICD-10-CM | POA: Diagnosis not present

## 2018-10-20 LAB — URINALYSIS
BILIRUBIN UA: NEGATIVE
Glucose, UA: NEGATIVE
Ketones, UA: NEGATIVE
Leukocytes, UA: NEGATIVE
Nitrite, UA: NEGATIVE
Protein, UA: NEGATIVE
RBC, UA: NEGATIVE
Specific Gravity, UA: 1.01 (ref 1.005–1.030)
Urobilinogen, Ur: 0.2 mg/dL (ref 0.2–1.0)
pH, UA: 5.5 (ref 5.0–7.5)

## 2018-10-20 NOTE — Progress Notes (Signed)
Subjective:  Patient ID: Ernest Martinez, male    DOB: 1955-10-16  Age: 63 y.o. MRN: [id number]  CC: Private DOT Physical   HPI Ernest Martinez presents for DOT exam. Treated for HTN,  Prediabetes, morbid obesity  Depression screen Ernest Martinez 2/9 09/17/2018 07/27/2018 07/01/2018  Decreased Interest 0 0 0  Down, Depressed, Hopeless 0 0 0  PHQ - 2 Score 0 0 0    History Ernest Martinez has a past medical history of Abnormal EKG, Bacteremia due to Gram-negative bacteria, Bladder filling defect, Borderline diabetes, Dislocation of right shoulder joint (sept 2014), GERD (gastroesophageal reflux disease), Hydrocele of testis, Hypertension, and Morbid obesity ([place]).   Ernest Martinez has a past surgical history that includes None and Hydrocele surgery (Right, 08/16/2013).   His family history includes Cancer in his father.Ernest Martinez reports that Ernest Martinez has never smoked. Ernest Martinez has never used smokeless tobacco. Ernest Martinez reports current alcohol use. Ernest Martinez reports that Ernest Martinez does not use drugs.    ROS Review of Systems  Constitutional: Negative.   HENT: Negative.   Eyes: Negative for visual disturbance.  Respiratory: Negative for cough and shortness of breath.   Cardiovascular: Negative for chest pain and leg swelling.  Gastrointestinal: Negative for abdominal pain, diarrhea, nausea and vomiting.  Genitourinary: Negative for difficulty urinating.  Musculoskeletal: Negative for arthralgias and myalgias.  Skin: Negative for rash.  Neurological: Negative for headaches.  Psychiatric/Behavioral: Negative for sleep disturbance.    Objective:  BP 130/76   Pulse 78   Temp 98.4 F (36.9 C) (Oral)   Ht 5\' 9"  (1.753 m)   Wt (!) 336 lb (152.4 kg)   BMI 49.62 kg/m   BP Readings from Last 3 Encounters:  10/20/18 130/76  09/17/18 131/82  07/27/18 128/77    Wt Readings from Last 3 Encounters:  10/20/18 (!) 336 lb (152.4 kg)  09/17/18 (!) 335 lb 3.2 oz (152 kg)  07/27/18 (!) 332 lb 9.6 oz (150.9 kg)     Physical Exam Constitutional:    General: Ernest Martinez is not in acute distress.    Appearance: Ernest Martinez is well-developed.  HENT:     Head: Normocephalic and atraumatic.     Right Ear: External ear normal.     Left Ear: External ear normal.     Nose: Nose normal.  Eyes:     Conjunctiva/sclera: Conjunctivae normal.     Pupils: Pupils are equal, round, and reactive to light.  Neck:     Musculoskeletal: Normal range of motion and neck supple.  Cardiovascular:     Rate and Rhythm: Normal rate and regular rhythm.     Heart sounds: Normal heart sounds. No murmur.  Pulmonary:     Effort: Pulmonary effort is normal. No respiratory distress.     Breath sounds: Normal breath sounds. No wheezing or rales.  Abdominal:     Palpations: Abdomen is soft.     Tenderness: There is no abdominal tenderness.  Musculoskeletal: Normal range of motion.  Skin:    General: Skin is warm and dry.  Neurological:     Mental Status: Ernest Martinez is alert and oriented to person, place, and time.     Deep Tendon Reflexes: Reflexes are normal and symmetric.  Psychiatric:        Behavior: Behavior normal.        Thought Content: Thought content normal.        Judgment: Judgment normal.       Assessment & Plan:   Ernest Martinez was seen today for private dot physical.  Diagnoses and all orders for this visit:  Encounter for Department of Transportation (DOT) examination for driving license renewal -     Urinalysis       I am having Ernest Martinez maintain his aspirin EC, omeprazole, furosemide, amLODipine, rOPINIRole, lisinopril-hydrochlorothiazide, traMADol, metFORMIN, and celecoxib.  Allergies as of 10/20/2018      Reactions   Lovastatin    Headache and burning in stomach.   Metoprolol Tartrate    headache      Medication List       Accurate as of October 20, 2018  9:53 AM. Always use your most recent med list.        amLODipine 10 MG tablet Commonly known as:  NORVASC TAKE ONE TABLET EVERY MORNING   aspirin EC 81 MG tablet Take 1 tablet (81 mg  total) by mouth daily.   celecoxib 200 MG capsule Commonly known as:  CELEBREX TAKE ONE (1) CAPSULE EACH DAY   furosemide 20 MG tablet Commonly known as:  LASIX TAKE ONE TABLET EVERY MORNING   lisinopril-hydrochlorothiazide 20-12.5 MG tablet Commonly known as:  PRINZIDE,ZESTORETIC TAKE ONE (1) TABLET EACH DAY   metFORMIN 1000 MG tablet Commonly known as:  GLUCOPHAGE TAKE ONE TABLET TWICE A DAY WITH FOOD   omeprazole 20 MG capsule Commonly known as:  PRILOSEC TAKE ONE CAPSULE EACH MORNING   rOPINIRole 1 MG tablet Commonly known as:  REQUIP TAKE ONE TABLET DAILY AT BEDTIME   traMADol 50 MG tablet Commonly known as:  ULTRAM TAKE 1 TO 2 TABLETS EVERY 8 HOURS AS NEEDED FOR PAIN        Follow-up: Return in about 1 year (around 10/21/2019) for DOT.  Claretta Fraise, M.D.

## 2018-11-05 ENCOUNTER — Other Ambulatory Visit: Payer: Self-pay | Admitting: Family

## 2018-11-05 DIAGNOSIS — I10 Essential (primary) hypertension: Secondary | ICD-10-CM

## 2018-11-19 ENCOUNTER — Other Ambulatory Visit: Payer: Self-pay | Admitting: Family

## 2018-11-19 DIAGNOSIS — G2581 Restless legs syndrome: Secondary | ICD-10-CM

## 2018-11-19 DIAGNOSIS — M1990 Unspecified osteoarthritis, unspecified site: Secondary | ICD-10-CM

## 2018-12-07 ENCOUNTER — Telehealth: Payer: Self-pay | Admitting: Family

## 2018-12-07 ENCOUNTER — Other Ambulatory Visit: Payer: Self-pay | Admitting: Family

## 2018-12-07 DIAGNOSIS — G2581 Restless legs syndrome: Secondary | ICD-10-CM

## 2018-12-07 DIAGNOSIS — Z0289 Encounter for other administrative examinations: Secondary | ICD-10-CM

## 2018-12-07 DIAGNOSIS — I10 Essential (primary) hypertension: Secondary | ICD-10-CM

## 2018-12-07 DIAGNOSIS — M1990 Unspecified osteoarthritis, unspecified site: Secondary | ICD-10-CM

## 2018-12-07 DIAGNOSIS — F1120 Opioid dependence, uncomplicated: Secondary | ICD-10-CM

## 2018-12-07 DIAGNOSIS — K219 Gastro-esophageal reflux disease without esophagitis: Secondary | ICD-10-CM

## 2018-12-07 MED ORDER — OMEPRAZOLE 20 MG PO CPDR: DELAYED_RELEASE_CAPSULE | ORAL | 1 refills | Status: DC

## 2018-12-07 MED ORDER — LISINOPRIL-HYDROCHLOROTHIAZIDE 20-12.5 MG PO TABS: ORAL_TABLET | ORAL | 0 refills | Status: DC

## 2018-12-07 MED ORDER — METFORMIN HCL 1000 MG PO TABS: ORAL_TABLET | ORAL | 0 refills | Status: DC

## 2018-12-07 MED ORDER — AMLODIPINE BESYLATE 10 MG PO TABS: 10.0000 mg | ORAL_TABLET | Freq: Every morning | ORAL | 0 refills | Status: DC

## 2018-12-07 MED ORDER — FUROSEMIDE 20 MG PO TABS: 20.0000 mg | ORAL_TABLET | Freq: Every morning | ORAL | 0 refills | Status: DC

## 2018-12-07 NOTE — Telephone Encounter (Signed)
Patient requesting 90 day supply of prescriptions.  Sent new prescriptions to [facility] [place].

## 2018-12-07 NOTE — Telephone Encounter (Signed)
PT wife is wanting to speak to nurse about his medication wanting to know if they are needing to get a month supply early? Due to corona virus?

## 2018-12-16 ENCOUNTER — Other Ambulatory Visit: Payer: Self-pay | Admitting: Family

## 2018-12-16 ENCOUNTER — Other Ambulatory Visit: Payer: Self-pay

## 2018-12-16 DIAGNOSIS — M1990 Unspecified osteoarthritis, unspecified site: Secondary | ICD-10-CM

## 2018-12-16 DIAGNOSIS — F1120 Opioid dependence, uncomplicated: Secondary | ICD-10-CM

## 2018-12-16 DIAGNOSIS — Z0289 Encounter for other administrative examinations: Secondary | ICD-10-CM

## 2018-12-16 MED ORDER — CELECOXIB 200 MG PO CAPS: ORAL_CAPSULE | ORAL | 0 refills | Status: DC

## 2018-12-17 ENCOUNTER — Telehealth: Payer: Self-pay | Admitting: Family

## 2018-12-17 NOTE — Telephone Encounter (Signed)
Aware. He will need lab work.

## 2018-12-18 ENCOUNTER — Other Ambulatory Visit: Payer: Self-pay

## 2018-12-21 ENCOUNTER — Other Ambulatory Visit: Payer: Self-pay

## 2018-12-21 ENCOUNTER — Encounter: Payer: Self-pay | Admitting: Family

## 2018-12-21 ENCOUNTER — Ambulatory Visit (INDEPENDENT_AMBULATORY_CARE_PROVIDER_SITE_OTHER): Payer: Medicare Other | Admitting: Family

## 2018-12-21 VITALS — BP 138/89 | HR 69 | Temp 98.0°F | Ht 69.0 in | Wt 344.0 lb

## 2018-12-21 DIAGNOSIS — E1159 Type 2 diabetes mellitus with other circulatory complications: Secondary | ICD-10-CM

## 2018-12-21 DIAGNOSIS — E1169 Type 2 diabetes mellitus with other specified complication: Secondary | ICD-10-CM

## 2018-12-21 DIAGNOSIS — E1165 Type 2 diabetes mellitus with hyperglycemia: Secondary | ICD-10-CM | POA: Diagnosis not present

## 2018-12-21 DIAGNOSIS — I10 Essential (primary) hypertension: Secondary | ICD-10-CM | POA: Diagnosis not present

## 2018-12-21 DIAGNOSIS — F1120 Opioid dependence, uncomplicated: Secondary | ICD-10-CM

## 2018-12-21 DIAGNOSIS — M1990 Unspecified osteoarthritis, unspecified site: Secondary | ICD-10-CM

## 2018-12-21 DIAGNOSIS — E785 Hyperlipidemia, unspecified: Secondary | ICD-10-CM

## 2018-12-21 DIAGNOSIS — I152 Hypertension secondary to endocrine disorders: Secondary | ICD-10-CM

## 2018-12-21 DIAGNOSIS — K219 Gastro-esophageal reflux disease without esophagitis: Secondary | ICD-10-CM | POA: Diagnosis not present

## 2018-12-21 DIAGNOSIS — E0590 Thyrotoxicosis, unspecified without thyrotoxic crisis or storm: Secondary | ICD-10-CM

## 2018-12-21 DIAGNOSIS — R7989 Other specified abnormal findings of blood chemistry: Secondary | ICD-10-CM

## 2018-12-21 DIAGNOSIS — Z0289 Encounter for other administrative examinations: Secondary | ICD-10-CM

## 2018-12-21 LAB — BAYER DCA HB A1C WAIVED: HB A1C (BAYER DCA - WAIVED): 5.9 % (ref <7.0)

## 2018-12-21 MED ORDER — TRAMADOL HCL 50 MG PO TABS: ORAL_TABLET | ORAL | 2 refills | Status: DC

## 2018-12-21 NOTE — Patient Instructions (Signed)

## 2018-12-21 NOTE — Progress Notes (Signed)
Subjective:    Patient ID: Ernest Martinez, male    DOB: 02-05-1956, 63 y.o.   MRN: [id number]  Chief Complaint  Patient presents with  . Medical Management of Chronic Issues   PT presents to the office today for chronic follow up and pain medication refill. Diabetes  He presents for his follow-up diabetic visit. He has type 2 diabetes mellitus. His disease course has been stable. There are no hypoglycemic associated symptoms. Pertinent negatives for diabetes include no blurred vision, no foot paresthesias and no visual change. There are no hypoglycemic complications. Symptoms are stable. Pertinent negatives for diabetic complications include no CVA, heart disease or nephropathy. Risk factors for coronary artery disease include dyslipidemia, diabetes mellitus, male sex, hypertension and sedentary lifestyle. He is following a generally unhealthy diet. (Does not check BS at home ) Eye exam is not current.  Hypertension  This is a chronic problem. The current episode started more than 1 year ago. The problem has been resolved since onset. The problem is controlled. Pertinent negatives include no blurred vision, malaise/fatigue, peripheral edema or shortness of breath. Risk factors for coronary artery disease include diabetes mellitus, dyslipidemia, obesity, male gender and sedentary lifestyle. The current treatment provides moderate improvement. There is no history of CVA.  Hyperlipidemia  This is a chronic problem. The current episode started more than 1 year ago. The problem is controlled. Recent lipid tests were reviewed and are normal. Exacerbating diseases include obesity. Pertinent negatives include no shortness of breath. Current antihyperlipidemic treatment includes statins. The current treatment provides moderate improvement of lipids. Risk factors for coronary artery disease include dyslipidemia, diabetes mellitus, male sex, hypertension and a sedentary lifestyle.  Gastroesophageal Reflux  He  complains of heartburn. He reports no belching or no coughing. This is a chronic problem. The current episode started more than 1 year ago. The problem occurs occasionally. The problem has been waxing and waning. He has tried a PPI for the symptoms. The treatment provided moderate relief.  Arthritis  Presents for follow-up visit. He complains of pain and stiffness. The symptoms have been stable. Affected locations include the right knee and left knee (back). His pain is at a severity of 9/10.      Review of Systems  Constitutional: Negative for malaise/fatigue.  Eyes: Negative for blurred vision.  Respiratory: Negative for cough and shortness of breath.   Gastrointestinal: Positive for heartburn.  Musculoskeletal: Positive for arthritis and stiffness.  All other systems reviewed and are negative.      Objective:   Physical Exam Vitals signs reviewed.  Constitutional:      General: He is not in acute distress.    Appearance: He is well-developed. He is obese.  HENT:     Head: Normocephalic.     Right Ear: Tympanic membrane normal.     Left Ear: Tympanic membrane normal.  Eyes:     General:        Right eye: No discharge.        Left eye: No discharge.     Pupils: Pupils are equal, round, and reactive to light.  Neck:     Musculoskeletal: Normal range of motion and neck supple.     Thyroid: No thyromegaly.  Cardiovascular:     Rate and Rhythm: Normal rate and regular rhythm.     Heart sounds: Normal heart sounds. No murmur.  Pulmonary:     Effort: Pulmonary effort is normal. No respiratory distress.     Breath sounds: Normal breath  sounds. No wheezing.  Abdominal:     General: Bowel sounds are normal. There is no distension.     Palpations: Abdomen is soft.     Tenderness: There is no abdominal tenderness.  Musculoskeletal: Normal range of motion.        General: Swelling and tenderness present.     Comments: Trace swelling in bilateral knees, Pain with flexion and  walking   Skin:    General: Skin is warm and dry.     Findings: No erythema or rash.  Neurological:     Mental Status: He is alert and oriented to person, place, and time.     Cranial Nerves: No cranial nerve deficit.     Deep Tendon Reflexes: Reflexes are normal and symmetric.  Psychiatric:        Behavior: Behavior normal.        Thought Content: Thought content normal.        Judgment: Judgment normal.       BP 138/89   Pulse 69   Temp 98 F (36.7 C)   Ht 5' 9" (1.753 m)   Wt (!) 344 lb (156 kg)   BMI 50.80 kg/m      Assessment & Plan:  [name] comes in today with chief complaint of Medical Management of Chronic Issues   Diagnosis and orders addressed:  1. Hypertension associated with diabetes ([place]) - CBC with Differential/Platelet - CMP14+EGFR  2. Gastroesophageal reflux disease, esophagitis presence not specified - CBC with Differential/Platelet - CMP14+EGFR  3. Type 2 diabetes mellitus with hyperglycemia, without long-term current use of insulin (HCC) - CBC with Differential/Platelet - CMP14+EGFR - Bayer DCA Hb A1c Waived - Microalbumin / creatinine urine ratio  4. Hyperlipidemia associated with type 2 diabetes mellitus ([place]) - CBC with Differential/Platelet - CMP14+EGFR  5. Arthritis - CBC with Differential/Platelet - CMP14+EGFR - traMADol (ULTRAM) 50 MG tablet; TAKE 1 TO 2 TABLETS EVERY 8 HOURS AS NEEDED FOR PAIN  Dispense: 120 tablet; Refill: 2  6. Morbid obesity ([place]) - CBC with Differential/Platelet - CMP14+EGFR  7. Uncomplicated opioid dependence ([place]) - CBC with Differential/Platelet - CMP14+EGFR - ToxASSURE Select 13 (MW), Urine - traMADol (ULTRAM) 50 MG tablet; TAKE 1 TO 2 TABLETS EVERY 8 HOURS AS NEEDED FOR PAIN  Dispense: 120 tablet; Refill: 2  8. Pain management contract signed - CBC with Differential/Platelet - CMP14+EGFR - ToxASSURE Select 13 (MW), Urine - traMADol (ULTRAM) 50 MG tablet; TAKE 1 TO 2 TABLETS EVERY 8 HOURS  AS NEEDED FOR PAIN  Dispense: 120 tablet; Refill: 2  9. Abnormal TSH - CBC with Differential/Platelet - CMP14+EGFR - TSH  10. Hyperthyroidism - CBC with Differential/Platelet - CMP14+EGFR - TSH   Labs pending PT reviewed in [place] controlled database, no red flags noted Health Maintenance reviewed Diet and exercise encouraged  Follow up plan: 3 months    Evelina Dun, FNP

## 2018-12-22 ENCOUNTER — Encounter: Payer: Self-pay | Admitting: *Deleted

## 2018-12-22 ENCOUNTER — Other Ambulatory Visit: Payer: Self-pay | Admitting: Family

## 2018-12-22 DIAGNOSIS — E0590 Thyrotoxicosis, unspecified without thyrotoxic crisis or storm: Secondary | ICD-10-CM

## 2018-12-22 LAB — CBC WITH DIFFERENTIAL/PLATELET
Basophils Absolute: 0 10*3/uL (ref 0.0–0.2)
Basos: 1 %
EOS (ABSOLUTE): 0.2 10*3/uL (ref 0.0–0.4)
EOS: 3 %
Hematocrit: 41.4 % (ref 37.5–51.0)
Hemoglobin: 14.2 g/dL (ref 13.0–17.7)
IMMATURE GRANULOCYTES: 0 %
Immature Grans (Abs): 0 10*3/uL (ref 0.0–0.1)
Lymphocytes Absolute: 2.2 10*3/uL (ref 0.7–3.1)
Lymphs: 27 %
MCH: 29.6 pg (ref 26.6–33.0)
MCHC: 34.3 g/dL (ref 31.5–35.7)
MCV: 86 fL (ref 79–97)
Monocytes Absolute: 0.6 10*3/uL (ref 0.1–0.9)
Monocytes: 7 %
Neutrophils Absolute: 5 10*3/uL (ref 1.4–7.0)
Neutrophils: 62 %
Platelets: 261 10*3/uL (ref 150–450)
RBC: 4.8 x10E6/uL (ref 4.14–5.80)
RDW: 11.8 % (ref 11.6–15.4)
WBC: 8.1 10*3/uL (ref 3.4–10.8)

## 2018-12-22 LAB — CMP14+EGFR
ALT: 16 IU/L (ref 0–44)
AST: 17 IU/L (ref 0–40)
Albumin/Globulin Ratio: 1.7 (ref 1.2–2.2)
Albumin: 4 g/dL (ref 3.8–4.8)
Alkaline Phosphatase: 59 IU/L (ref 39–117)
BUN/Creatinine Ratio: 12 (ref 10–24)
BUN: 11 mg/dL (ref 8–27)
Bilirubin Total: 0.4 mg/dL (ref 0.0–1.2)
CALCIUM: 9.7 mg/dL (ref 8.6–10.2)
CO2: 25 mmol/L (ref 20–29)
Chloride: 100 mmol/L (ref 96–106)
Creatinine, Ser: 0.91 mg/dL (ref 0.76–1.27)
GFR calc Af Amer: 104 mL/min/{1.73_m2} (ref >59)
GFR, EST NON AFRICAN AMERICAN: 90 mL/min/{1.73_m2} (ref >59)
Globulin, Total: 2.4 g/dL (ref 1.5–4.5)
Glucose: 103 mg/dL — ABNORMAL HIGH (ref 65–99)
Potassium: 4.2 mmol/L (ref 3.5–5.2)
Sodium: 141 mmol/L (ref 134–144)
TOTAL PROTEIN: 6.4 g/dL (ref 6.0–8.5)

## 2018-12-22 LAB — TSH: TSH: 0.161 u[IU]/mL — ABNORMAL LOW (ref 0.450–4.500)

## 2018-12-22 LAB — MICROALBUMIN / CREATININE URINE RATIO
Creatinine, Urine: 15.2 mg/dL
Microalb/Creat Ratio: <20 mg/g creat (ref 0–29)
Microalbumin, Urine: <3 ug/mL

## 2018-12-23 LAB — TOXASSURE SELECT 13 (MW), URINE

## 2018-12-29 ENCOUNTER — Other Ambulatory Visit: Payer: Self-pay | Admitting: Family

## 2018-12-29 DIAGNOSIS — M1990 Unspecified osteoarthritis, unspecified site: Secondary | ICD-10-CM

## 2019-02-17 ENCOUNTER — Other Ambulatory Visit: Payer: Self-pay | Admitting: Family

## 2019-02-17 DIAGNOSIS — F1120 Opioid dependence, uncomplicated: Secondary | ICD-10-CM

## 2019-02-17 DIAGNOSIS — M1990 Unspecified osteoarthritis, unspecified site: Secondary | ICD-10-CM

## 2019-02-17 DIAGNOSIS — Z0289 Encounter for other administrative examinations: Secondary | ICD-10-CM

## 2019-03-08 ENCOUNTER — Other Ambulatory Visit: Payer: Self-pay

## 2019-03-08 ENCOUNTER — Ambulatory Visit (INDEPENDENT_AMBULATORY_CARE_PROVIDER_SITE_OTHER): Payer: Medicare Other | Admitting: "Endocrinology

## 2019-03-08 ENCOUNTER — Encounter: Payer: Self-pay | Admitting: "Endocrinology

## 2019-03-08 VITALS — BP 129/84 | HR 61 | Temp 97.8°F | Ht 65.0 in | Wt 344.0 lb

## 2019-03-08 DIAGNOSIS — E0590 Thyrotoxicosis, unspecified without thyrotoxic crisis or storm: Secondary | ICD-10-CM | POA: Diagnosis not present

## 2019-03-08 NOTE — Progress Notes (Signed)
Endocrinology Consult Note                                            03/08/2019, 3:19 PM   Subjective:    Patient ID: Ernest Martinez, male    DOB: 14-Mar-1956, PCP Sharion Balloon, FNP   Past Medical History:  Diagnosis Date  . Abnormal EKG   . Bacteremia due to Gram-negative bacteria    a. 03/2013: klebsiella UTI with bacteremia.  . Bladder filling defect    a. 03/2013: Filling defect at base of bladder by CT pelvis, for f/u urology.  . Borderline diabetes   . Dislocation of right shoulder joint sept 2014   after fall  . GERD (gastroesophageal reflux disease)   . Hydrocele of testis    a. 03/2013: US showing Large right testicular hydrocele and small left hydrocele.  Marland Kitchen Hypertension   . Morbid obesity ([place])    Past Surgical History:  Procedure Laterality Date  . HYDROCELE EXCISION Right 08/16/2013   Procedure: HYDROCELECTOMY ADULT  Procedure: Right Scrotal Hydrocele Repair;  Surgeon: Bernestine Amass, MD;  Location: [facility];  Service: Urology;  Laterality: Right;  . None     Social History   Socioeconomic History  . Marital status: Married    Spouse name: Not on file  . Number of children: 2  . Years of education: Not on file  . Highest education level: Not on file  Occupational History  . Occupation: Merchandiser, retail: SELF  Social Needs  . Financial resource strain: Not on file  . Food insecurity    Worry: Not on file    Inability: Not on file  . Transportation needs    Medical: Not on file    Non-medical: Not on file  Tobacco Use  . Smoking status: Never Smoker  . Smokeless tobacco: Never Used  Substance and Sexual Activity  . Alcohol use: Yes    Comment: socially  . Drug use: No  . Sexual activity: Not on file  Lifestyle  . Physical activity    Days per week: Not on file    Minutes per session: Not on file  . Stress: Not on file  Relationships  . Social Herbalist on phone: Not on file    Gets together: Not on file   Attends religious service: Not on file    Active member of club or organization: Not on file    Attends meetings of clubs or organizations: Not on file    Relationship status: Not on file  Other Topics Concern  . Not on file  Social History Narrative  . Not on file   Family History  Problem Relation Age of Onset  . Cancer Father        prostate cancer  . CAD Neg Hx    Outpatient Encounter Medications as of 03/08/2019  Medication Sig  . amLODipine (NORVASC) 10 MG tablet Take 1 tablet (10 mg total) by mouth every morning.  Marland Kitchen aspirin EC 81 MG tablet Take 1 tablet (81 mg total) by mouth daily.  . celecoxib (CELEBREX) 200 MG capsule TAKE ONE (1) CAPSULE EACH DAY  . furosemide (LASIX) 20 MG tablet Take 1 tablet (20 mg total) by mouth every morning.  Marland Kitchen lisinopril-hydrochlorothiazide (PRINZIDE,ZESTORETIC) 20-12.5 MG tablet TAKE ONE (1) TABLET EACH DAY  .  metFORMIN (GLUCOPHAGE) 1000 MG tablet TAKE ONE TABLET TWICE A DAY WITH FOOD  . omeprazole (PRILOSEC) 20 MG capsule TAKE ONE CAPSULE EACH MORNING  . rOPINIRole (REQUIP) 1 MG tablet TAKE ONE TABLET DAILY AT BEDTIME  . traMADol (ULTRAM) 50 MG tablet TAKE 1 TO 2 TABLETS EVERY 8 HOURS AS NEEDED FOR PAIN   No facility-administered encounter medications on file as of 03/08/2019.    ALLERGIES: Allergies  Allergen Reactions  . Lovastatin     Headache and burning in stomach.  . Metoprolol Tartrate     headache    VACCINATION STATUS:  There is no immunization history on file for this patient.  HPI [name] is 63 y.o. male who presents today with a medical history as above. he is being seen in consultation for suppressed TSH requested by Sharion Balloon, FNP. 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  He subsequently underwent fine-needle aspiration of this nodule with benign findings.   Subsequently did not require any antithyroid intervention.  His TSH was suppressed in September 2019 at 0.065.  Repeat labs on December 21, 2018 also showed suppressed TSH at 0.161.  Patient is currently not on any antithyroid treatment nor thyroid hormone supplements.  He reports some unquantified weight gain over several month period of time.  He denies palpitations, tremors, heat or cold intolerance.  Review of Systems  Constitutional: + Gained 8 pounds since January 2020, no fatigue, no subjective hyperthermia, no subjective hypothermia Eyes: no blurry vision, no xerophthalmia ENT: no sore throat, no nodules palpated in throat, no dysphagia/odynophagia, no hoarseness Cardiovascular: no Chest Pain, no Shortness of Breath, no palpitations, no leg swelling Respiratory: no cough, no shortness of breath Gastrointestinal: no Nausea/Vomiting/Diarhhea Musculoskeletal: no muscle/joint aches Skin: no rashes Neurological: no tremors, no numbness, no tingling, no dizziness Psychiatric: no depression, no anxiety  Objective:    BP 129/84   Pulse 61   Temp 97.8 F (36.6 C)   Ht 5\' 5"  (1.651 m)   Wt (!) 344 lb (156 kg)   SpO2 97%   BMI 57.24 kg/m   Wt Readings from Last 3 Encounters:  03/08/19 (!) 344 lb (156 kg)  12/21/18 (!) 344 lb (156 kg)  10/20/18 (!) 336 lb (152.4 kg)    Physical Exam  Constitutional:  + Obese, not in acute distress, normal state of mind Eyes: PERRLA, EOMI, no exophthalmos ENT: moist mucous membranes,+ gross thyromegaly, no gross cervical lymphadenopathy Cardiovascular: normal precordial activity, Regular Rate and Rhythm, no Murmur/Rubs/Gallops Respiratory:  adequate breathing efforts, no gross chest deformity, Clear to auscultation bilaterally Gastrointestinal: abdomen soft, Non -tender, No distension, Bowel Sounds present, no gross organomegaly Musculoskeletal: no gross deformities, strength intact in all four extremities Skin: moist, warm, no rashes Neurological:  no tremor with outstretched hands, Deep tendon reflexes normal in bilateral lower extremities.  CMP ( most recent) CMP     Component Value Date/Time   NA 141 12/21/2018 0902   K 4.2 12/21/2018 0902   CL 100 12/21/2018 0902   CO2 25 12/21/2018 0902   GLUCOSE 103 (H) 12/21/2018 0902   GLUCOSE 112 (H) 08/10/2013 1405   BUN 11 12/21/2018 0902   CREATININE 0.91 12/21/2018 0902   CALCIUM 9.7 12/21/2018 0902   PROT 6.4 12/21/2018 0902   ALBUMIN 4.0 12/21/2018 0902   AST 17  12/21/2018 0902   ALT 16 12/21/2018 0902   ALKPHOS 59 12/21/2018 0902   BILITOT 0.4 12/21/2018 0902   GFRNONAA 90 12/21/2018 0902   GFRAA 104 12/21/2018 0902     Diabetic Labs (most recent): Lab Results  Component Value Date   HGBA1C 5.9 12/21/2018   HGBA1C 5.9 09/17/2018   HGBA1C 5.9 03/16/2018     Lipid Panel ( most recent) Lipid Panel     Component Value Date/Time   CHOL 167 06/18/2018 0910   TRIG 75 06/18/2018 0910   HDL 53 06/18/2018 0910   CHOLHDL 3.2 06/18/2018 0910   LDLCALC 99 06/18/2018 0910      Lab Results  Component Value Date   TSH 0.161 (L) 12/21/2018   TSH 0.065 (L) 06/18/2018      Assessment & Plan:   1. Hyperthyroidism  - KEGAN MCKEITHAN  is being seen at a kind request of Sharion Balloon, FNP. - I have reviewed his available thyroid records and clinically evaluated the patient. - Based on these reviews, he has had nodular goiter fully worked up in 2012 with benign biopsy.  However, most recently, his lab information is not sufficient to make a treatment plan today.   he will need a repeat,  more complete thyroid function test, as well as thyroid uptake and scan.  -If he is found to have primary hyperthyroidism, he will be considered for I-131 thyroid ablation. - I did not initiate any new prescriptions today. - I advised him  to maintain close follow up with Sharion Balloon, FNP for primary care needs.   - Time spent with the patient: 45 minutes, of which >50% was spent  in obtaining information about his symptoms, reviewing his previous labs/studies,  evaluations, and treatments, counseling him about his abnormal thyroid function test, history of nodular goiter, and developing a plan to confirm the diagnosis and long term treatment based on the latest standards of care/guidelines.    Ernest Martinez participated in the discussions, expressed understanding, and voiced agreement with the above plans.  All questions were answered to his satisfaction. he is encouraged to contact clinic should he have any questions or concerns prior to his return visit.  Follow up plan: Return in about 2 weeks (around 03/22/2019) for Labs Today- Non-Fasting Ok, Follow up with Thyroid Uptake and Scan.   Glade Lloyd, MD [facility] [address] [place], [place] [address] Phone: [phone]  Fax: [phone]     03/08/2019, 3:19 PM  This note was partially dictated with voice recognition software. Similar sounding words can be transcribed inadequately or may not  be corrected upon review.

## 2019-03-09 LAB — T4, FREE: Free T4: 1.2 ng/dL (ref 0.8–1.8)

## 2019-03-09 LAB — THYROGLOBULIN ANTIBODY: Thyroglobulin Ab: <1 IU/mL (ref <=1)

## 2019-03-09 LAB — T3, FREE: T3, Free: 3.3 pg/mL (ref 2.3–4.2)

## 2019-03-09 LAB — TSH: TSH: 0.21 mIU/L — ABNORMAL LOW (ref 0.40–4.50)

## 2019-03-09 LAB — THYROID PEROXIDASE ANTIBODY: Thyroperoxidase Ab SerPl-aCnc: <1 IU/mL (ref <9)

## 2019-03-17 ENCOUNTER — Other Ambulatory Visit: Payer: Self-pay | Admitting: Family

## 2019-03-17 DIAGNOSIS — Z0289 Encounter for other administrative examinations: Secondary | ICD-10-CM

## 2019-03-17 DIAGNOSIS — M1990 Unspecified osteoarthritis, unspecified site: Secondary | ICD-10-CM

## 2019-03-17 DIAGNOSIS — F1120 Opioid dependence, uncomplicated: Secondary | ICD-10-CM

## 2019-03-22 ENCOUNTER — Other Ambulatory Visit: Payer: Self-pay

## 2019-03-23 ENCOUNTER — Ambulatory Visit: Payer: Medicare Other | Admitting: "Endocrinology

## 2019-03-23 ENCOUNTER — Other Ambulatory Visit: Payer: Self-pay

## 2019-03-23 ENCOUNTER — Ambulatory Visit (INDEPENDENT_AMBULATORY_CARE_PROVIDER_SITE_OTHER): Payer: Medicare Other | Admitting: Family

## 2019-03-23 ENCOUNTER — Encounter (HOSPITAL_COMMUNITY): Payer: Medicare Other

## 2019-03-23 ENCOUNTER — Encounter: Payer: Self-pay | Admitting: Family

## 2019-03-23 VITALS — BP 131/81 | HR 62 | Temp 97.7°F | Ht 65.0 in | Wt 343.0 lb

## 2019-03-23 DIAGNOSIS — F1120 Opioid dependence, uncomplicated: Secondary | ICD-10-CM

## 2019-03-23 DIAGNOSIS — K219 Gastro-esophageal reflux disease without esophagitis: Secondary | ICD-10-CM | POA: Diagnosis not present

## 2019-03-23 DIAGNOSIS — I10 Essential (primary) hypertension: Secondary | ICD-10-CM

## 2019-03-23 DIAGNOSIS — E1159 Type 2 diabetes mellitus with other circulatory complications: Secondary | ICD-10-CM

## 2019-03-23 DIAGNOSIS — E785 Hyperlipidemia, unspecified: Secondary | ICD-10-CM

## 2019-03-23 DIAGNOSIS — E1169 Type 2 diabetes mellitus with other specified complication: Secondary | ICD-10-CM | POA: Diagnosis not present

## 2019-03-23 DIAGNOSIS — E1165 Type 2 diabetes mellitus with hyperglycemia: Secondary | ICD-10-CM

## 2019-03-23 DIAGNOSIS — M1990 Unspecified osteoarthritis, unspecified site: Secondary | ICD-10-CM

## 2019-03-23 DIAGNOSIS — Z0289 Encounter for other administrative examinations: Secondary | ICD-10-CM

## 2019-03-23 MED ORDER — TRAMADOL HCL 50 MG PO TABS: 100.0000 mg | ORAL_TABLET | Freq: Two times a day (BID) | ORAL | 2 refills | Status: DC | PRN

## 2019-03-23 NOTE — Patient Instructions (Signed)
Chronic Knee Pain, Adult Chronic knee pain is pain in one or both knees that lasts longer than 3 months. Symptoms of chronic knee pain may include swelling, stiffness, and discomfort. Age-related wear and tear (osteoarthritis) of the knee joint is the most common cause of chronic knee pain. Other possible causes include:  A long-term immune-related disease that causes inflammation of the knee (rheumatoid arthritis). This usually affects both knees.  Inflammatory arthritis, such as gout or pseudogout.  An injury to the knee that causes arthritis.  An injury to the knee that damages the ligaments. Ligaments are strong tissues that connect bones to each other.  Runner's knee or pain behind the kneecap. Treatment for chronic knee pain depends on the cause. The main treatments for chronic knee pain are physical therapy and weight loss. This condition may also be treated with medicines, injections, a knee sleeve or brace, and by using crutches. Rest, ice, compression (pressure), and elevation (RICE) therapy may also be recommended. Follow these instructions at home: If you have a knee sleeve or brace:   Wear it as told by your health care provider. Remove it only as told by your health care provider.  Loosen it if your toes tingle, become numb, or turn cold and blue.  Keep it clean.  If the sleeve or brace is not waterproof: ? Do not let it get wet. ? Remove it if allowed by your health care provider, or cover it with a watertight covering when you take a bath or a shower. Managing pain, stiffness, and swelling      If directed, apply heat to the affected area as often as told by your health care provider. Use the heat source that your health care provider recommends, such as a moist heat pack or a heating pad. ? If you have a removable sleeve or brace, remove it as told by your health care provider. ? Place a towel between your skin and the heat source. ? Leave the heat on for 20-30  minutes. ? Remove the heat if your skin turns bright red. This is especially important if you are unable to feel pain, heat, or cold. You may have a greater risk of getting burned.  If directed, put ice on the affected area. ? If you have a removable sleeve or brace, remove it as told by your health care provider. ? Put ice in a plastic bag. ? Place a towel between your skin and the bag. ? Leave the ice on for 20 minutes, 2-3 times a day.  Move your toes often to reduce stiffness and swelling.  Raise (elevate) the injured area above the level of your heart while you are sitting or lying down. Activity  Avoid activities where both feet leave the ground at the same time (high-impact activities). Examples are running, jumping rope, and doing jumping jacks.  Return to your normal activities as told by your health care provider. Ask your health care provider what activities are safe for you.  Follow the exercise plan that your health care provider designed for you. Your health care provider may suggest that you: ? Avoid activities that make knee pain worse. This may require you to change your exercise routines, sport participation, or job duties. ? Wear shoes with cushioned soles. ? Avoid high-impact activities or sports that require running and sudden changes in direction. ? Do physical therapy as told by your health care provider. Physical therapy is planned to match your needs and abilities. It may include  exercises for strength, flexibility, stability, and endurance. ? Do exercises that increase balance and strength, such as tai chi and yoga.  Do not use the injured limb to support your body weight until your health care provider says that you can. Use crutches, a cane, or a walker, as told by your health care provider. General instructions  Take over-the-counter and prescription medicines only as told by your health care provider.  Lose weight if you are overweight. Losing even a little  weight can reduce knee pain. Ask your health care provider what your ideal weight is, and how to safely lose extra weight. A food expert (dietitian) may be able to help you plan your meals.  Do not use any products that contain nicotine or tobacco, such as cigarettes, e-cigarettes, and chewing tobacco. These can delay healing. If you need help quitting, ask your health care provider.  Keep all follow-up visits as told by your health care provider. This is important. Contact a health care provider if:  You have knee pain that is not getting better or gets worse.  You are unable to do your physical therapy exercises due to knee pain. Get help right away if:  Your knee swells and the swelling becomes worse.  You cannot move your knee.  You have severe knee pain. Summary  Knee pain that lasts more than 3 months is considered chronic knee pain.  The main treatments for chronic knee pain are physical therapy and weight loss. You may also need to take medicines, wear a knee sleeve or brace, use crutches, and apply ice or heat.  Losing even a little weight can reduce knee pain. Ask your health care provider what your ideal weight is, and how to safely lose extra weight. A food expert (dietitian) may be able to help you plan your meals.  Work with a physical therapist to make a safe exercise program, as told by your health care provider. This information is not intended to replace advice given to you by your health care provider. Make sure you discuss any questions you have with your health care provider. Document Released: 11/19/2018 Document Revised: 11/19/2018 Document Reviewed: 11/19/2018 Elsevier Patient Education  2020 [employer].

## 2019-03-23 NOTE — Progress Notes (Signed)
Subjective:    Patient ID: Ernest Martinez, male    DOB: [birth year], 63 y.o.   MRN: [id number]  Chief Complaint  Patient presents with  . Medical Management of Chronic Issues    three month recheck   PT presents to the office today for chronic follow up and pain medication refill. He is followed by Endocrinologists for hyperthyroidism.  Diabetes He presents for his follow-up diabetic visit. He has type 2 diabetes mellitus. His disease course has been stable. There are no hypoglycemic associated symptoms. Pertinent negatives for diabetes include no blurred vision, no foot paresthesias and no visual change. Symptoms are stable. Diabetic complications include heart disease. Risk factors for coronary artery disease include diabetes mellitus, dyslipidemia, male sex, hypertension and sedentary lifestyle. He is following a generally unhealthy diet. (Does not check BS at home)  Hypertension This is a chronic problem. The current episode started more than 1 year ago. The problem has been resolved since onset. The problem is controlled. Pertinent negatives include no blurred vision, peripheral edema or shortness of breath. Risk factors for coronary artery disease include dyslipidemia, diabetes mellitus, obesity, male gender and sedentary lifestyle. Hypertensive end-organ damage includes CAD/MI.  Hyperlipidemia This is a chronic problem. The current episode started more than 1 year ago. The problem is uncontrolled. Recent lipid tests were reviewed and are high. Exacerbating diseases include obesity. Pertinent negatives include no shortness of breath. Current antihyperlipidemic treatment includes statins. The current treatment provides moderate improvement of lipids. Risk factors for coronary artery disease include dyslipidemia, diabetes mellitus, male sex, hypertension and a sedentary lifestyle.  Gastroesophageal Reflux He complains of heartburn. He reports no belching or no choking. This is a chronic problem.  The current episode started more than 1 year ago. The problem occurs occasionally. Risk factors include obesity. He has tried a PPI for the symptoms. The treatment provided moderate relief.  Arthritis Presents for follow-up visit. He complains of pain and stiffness. The symptoms have been worsening. Affected locations include the right knee and left knee. His pain is at a severity of 10/10.     Current opioids rx- Ultram 100 mg q 12 hours Prn  # meds rx- 120 Effectiveness of current meds-Pt's pain is still 10 out 10 Adverse reactions form pain meds-none Morphine equivalent- 22 mme/day  Pill count performed-No Last drug screen - 12/21/18 ( high risk q78m, moderate risk q1m, low risk yearly ) Urine drug screen today- No Was the [place] reviewed- Yes  If yes were their any concerning findings? - None  No flowsheet data found.   Pain contract signed on: 12/22/18   Review of Systems  Eyes: Negative for blurred vision.  Respiratory: Negative for choking and shortness of breath.   Gastrointestinal: Positive for heartburn.  Musculoskeletal: Positive for arthritis and stiffness.  All other systems reviewed and are negative.      Objective:   Physical Exam Vitals signs reviewed.  Constitutional:      General: He is not in acute distress.    Appearance: He is well-developed. He is obese.  HENT:     Head: Normocephalic.     Right Ear: Tympanic membrane normal.     Left Ear: Tympanic membrane normal.  Eyes:     General:        Right eye: No discharge.        Left eye: No discharge.     Pupils: Pupils are equal, round, and reactive to light.  Neck:     Musculoskeletal:  Normal range of motion and neck supple.     Thyroid: No thyromegaly.  Cardiovascular:     Rate and Rhythm: Normal rate and regular rhythm.     Heart sounds: Normal heart sounds. No murmur.  Pulmonary:     Effort: Pulmonary effort is normal. No respiratory distress.     Breath sounds: Normal breath sounds. No  wheezing.  Abdominal:     General: Bowel sounds are normal. There is no distension.     Palpations: Abdomen is soft.     Tenderness: There is no abdominal tenderness.  Musculoskeletal:        General: Tenderness present.     Comments: Pain in bilateral knees with flexion or extension  Skin:    General: Skin is warm and dry.     Findings: No erythema or rash.  Neurological:     Mental Status: He is alert and oriented to person, place, and time.     Cranial Nerves: No cranial nerve deficit.     Deep Tendon Reflexes: Reflexes are normal and symmetric.  Psychiatric:        Behavior: Behavior normal.        Thought Content: Thought content normal.        Judgment: Judgment normal.       BP 131/81   Pulse 62   Temp 97.7 F (36.5 C) (Oral)   Ht 5\' 5"  (1.651 m)   Wt (!) 343 lb (155.6 kg)   BMI 57.08 kg/m      Assessment & Plan:  Ernest Martinez comes in today with chief complaint of Medical Management of Chronic Issues (three month recheck)   Diagnosis and orders addressed:  1. Hypertension associated with diabetes ([place])  2. Gastroesophageal reflux disease, esophagitis presence not specified  3. Hyperlipidemia associated with type 2 diabetes mellitus (Ernest Martinez)  4. Type 2 diabetes mellitus with hyperglycemia, without long-term current use of insulin (Ernest Martinez)  5. Arthritis - traMADol (ULTRAM) 50 MG tablet; Take 2 tablets (100 mg total) by mouth every 12 (twelve) hours as needed.  Dispense: 120 tablet; Refill: 2  6. Pain management contract signed - traMADol (ULTRAM) 50 MG tablet; Take 2 tablets (100 mg total) by mouth every 12 (twelve) hours as needed.  Dispense: 120 tablet; Refill: 2  7. Uncomplicated opioid dependence (Ernest Martinez) - traMADol (ULTRAM) 50 MG tablet; Take 2 tablets (100 mg total) by mouth every 12 (twelve) hours as needed.  Dispense: 120 tablet; Refill: 2  8. Morbid obesity (Ernest Martinez)   Labs reviewed- Will hold off on labs today, will get on next visit Health Maintenance  reviewed Diet and exercise encouraged  Follow up plan: 3 months    Ernest Dun, FNP

## 2019-03-24 ENCOUNTER — Encounter (HOSPITAL_COMMUNITY): Payer: Medicare Other

## 2019-03-29 DIAGNOSIS — L03115 Cellulitis of right lower limb: Secondary | ICD-10-CM | POA: Diagnosis not present

## 2019-04-14 ENCOUNTER — Ambulatory Visit: Payer: Medicare Other | Admitting: "Endocrinology

## 2019-04-16 ENCOUNTER — Ambulatory Visit: Payer: Medicare Other | Admitting: "Endocrinology

## 2019-04-28 ENCOUNTER — Ambulatory Visit: Payer: Medicare Other | Admitting: "Endocrinology

## 2019-05-21 ENCOUNTER — Other Ambulatory Visit: Payer: Self-pay | Admitting: Family

## 2019-05-21 DIAGNOSIS — G2581 Restless legs syndrome: Secondary | ICD-10-CM

## 2019-06-14 ENCOUNTER — Other Ambulatory Visit: Payer: Self-pay | Admitting: Family

## 2019-06-14 DIAGNOSIS — F1120 Opioid dependence, uncomplicated: Secondary | ICD-10-CM

## 2019-06-14 DIAGNOSIS — Z0289 Encounter for other administrative examinations: Secondary | ICD-10-CM

## 2019-06-14 DIAGNOSIS — M1990 Unspecified osteoarthritis, unspecified site: Secondary | ICD-10-CM

## 2019-06-15 ENCOUNTER — Other Ambulatory Visit: Payer: Self-pay | Admitting: Family

## 2019-06-15 DIAGNOSIS — Z0289 Encounter for other administrative examinations: Secondary | ICD-10-CM

## 2019-06-15 DIAGNOSIS — M1990 Unspecified osteoarthritis, unspecified site: Secondary | ICD-10-CM

## 2019-06-15 DIAGNOSIS — K219 Gastro-esophageal reflux disease without esophagitis: Secondary | ICD-10-CM

## 2019-06-15 DIAGNOSIS — F1120 Opioid dependence, uncomplicated: Secondary | ICD-10-CM

## 2019-06-18 ENCOUNTER — Other Ambulatory Visit: Payer: Self-pay

## 2019-06-21 ENCOUNTER — Telehealth: Payer: Self-pay | Admitting: "Endocrinology

## 2019-06-21 ENCOUNTER — Encounter: Payer: Self-pay | Admitting: Family

## 2019-06-21 ENCOUNTER — Ambulatory Visit (INDEPENDENT_AMBULATORY_CARE_PROVIDER_SITE_OTHER): Payer: Medicare Other | Admitting: Family

## 2019-06-21 ENCOUNTER — Other Ambulatory Visit: Payer: Self-pay

## 2019-06-21 ENCOUNTER — Ambulatory Visit: Payer: Medicare Other | Admitting: Family

## 2019-06-21 VITALS — BP 130/81 | HR 72 | Temp 98.2°F | Ht 65.0 in | Wt 341.6 lb

## 2019-06-21 DIAGNOSIS — E1165 Type 2 diabetes mellitus with hyperglycemia: Secondary | ICD-10-CM

## 2019-06-21 DIAGNOSIS — Z1212 Encounter for screening for malignant neoplasm of rectum: Secondary | ICD-10-CM

## 2019-06-21 DIAGNOSIS — M1990 Unspecified osteoarthritis, unspecified site: Secondary | ICD-10-CM

## 2019-06-21 DIAGNOSIS — E1159 Type 2 diabetes mellitus with other circulatory complications: Secondary | ICD-10-CM

## 2019-06-21 DIAGNOSIS — I10 Essential (primary) hypertension: Secondary | ICD-10-CM

## 2019-06-21 DIAGNOSIS — I152 Hypertension secondary to endocrine disorders: Secondary | ICD-10-CM

## 2019-06-21 DIAGNOSIS — F1120 Opioid dependence, uncomplicated: Secondary | ICD-10-CM

## 2019-06-21 DIAGNOSIS — K219 Gastro-esophageal reflux disease without esophagitis: Secondary | ICD-10-CM

## 2019-06-21 DIAGNOSIS — E1169 Type 2 diabetes mellitus with other specified complication: Secondary | ICD-10-CM | POA: Diagnosis not present

## 2019-06-21 DIAGNOSIS — Z1211 Encounter for screening for malignant neoplasm of colon: Secondary | ICD-10-CM

## 2019-06-21 DIAGNOSIS — Z0289 Encounter for other administrative examinations: Secondary | ICD-10-CM

## 2019-06-21 DIAGNOSIS — E785 Hyperlipidemia, unspecified: Secondary | ICD-10-CM

## 2019-06-21 LAB — CMP14+EGFR
ALT: 17 IU/L (ref 0–44)
AST: 9 IU/L (ref 0–40)
Albumin/Globulin Ratio: 1.5 (ref 1.2–2.2)
Albumin: 4 g/dL (ref 3.8–4.8)
Alkaline Phosphatase: 62 IU/L (ref 39–117)
BUN/Creatinine Ratio: 11 (ref 10–24)
BUN: 11 mg/dL (ref 8–27)
Bilirubin Total: 0.4 mg/dL (ref 0.0–1.2)
CO2: 28 mmol/L (ref 20–29)
Calcium: 9.9 mg/dL (ref 8.6–10.2)
Chloride: 100 mmol/L (ref 96–106)
Creatinine, Ser: 0.96 mg/dL (ref 0.76–1.27)
GFR calc Af Amer: 98 mL/min/{1.73_m2} (ref >59)
GFR calc non Af Amer: 84 mL/min/{1.73_m2} (ref >59)
Globulin, Total: 2.6 g/dL (ref 1.5–4.5)
Glucose: 123 mg/dL — ABNORMAL HIGH (ref 65–99)
Potassium: 3.8 mmol/L (ref 3.5–5.2)
Sodium: 141 mmol/L (ref 134–144)
Total Protein: 6.6 g/dL (ref 6.0–8.5)

## 2019-06-21 LAB — CBC WITH DIFFERENTIAL/PLATELET
Basophils Absolute: 0 10*3/uL (ref 0.0–0.2)
Basos: 1 %
EOS (ABSOLUTE): 0.2 10*3/uL (ref 0.0–0.4)
Eos: 3 %
Hematocrit: 43.2 % (ref 37.5–51.0)
Hemoglobin: 14.7 g/dL (ref 13.0–17.7)
Immature Grans (Abs): 0 10*3/uL (ref 0.0–0.1)
Immature Granulocytes: 0 %
Lymphocytes Absolute: 2.1 10*3/uL (ref 0.7–3.1)
Lymphs: 23 %
MCH: 29 pg (ref 26.6–33.0)
MCHC: 34 g/dL (ref 31.5–35.7)
MCV: 85 fL (ref 79–97)
Monocytes Absolute: 0.6 10*3/uL (ref 0.1–0.9)
Monocytes: 7 %
Neutrophils Absolute: 5.9 10*3/uL (ref 1.4–7.0)
Neutrophils: 66 %
Platelets: 268 10*3/uL (ref 150–450)
RBC: 5.07 x10E6/uL (ref 4.14–5.80)
RDW: 11.9 % (ref 11.6–15.4)
WBC: 8.9 10*3/uL (ref 3.4–10.8)

## 2019-06-21 LAB — BAYER DCA HB A1C WAIVED: HB A1C (BAYER DCA - WAIVED): 5.9 % (ref <7.0)

## 2019-06-21 LAB — LIPID PANEL
Chol/HDL Ratio: 3.1 ratio (ref 0.0–5.0)
Cholesterol, Total: 171 mg/dL (ref 100–199)
HDL: 55 mg/dL (ref >39)
LDL Chol Calc (NIH): 101 mg/dL — ABNORMAL HIGH (ref 0–99)
Triglycerides: 80 mg/dL (ref 0–149)
VLDL Cholesterol Cal: 15 mg/dL (ref 5–40)

## 2019-06-21 MED ORDER — TRAMADOL HCL 50 MG PO TABS: 100.0000 mg | ORAL_TABLET | Freq: Two times a day (BID) | ORAL | 2 refills | Status: DC | PRN

## 2019-06-21 NOTE — Telephone Encounter (Signed)
Scheduled for 10/5

## 2019-06-21 NOTE — Progress Notes (Signed)
Subjective:    Patient ID: Ernest Martinez, male    DOB: [birth year], 63 y.o.   MRN: [id number]  Chief Complaint  Patient presents with  . Medical Management of Chronic Issues    three  month recheck   PT presents to the office today for chronic follow up and pain medication refill.  He is followed by Endocrinologists for hyperthyroidism.  Diabetes He presents for his follow-up diabetic visit. He has type 2 diabetes mellitus. His disease course has been stable. There are no hypoglycemic associated symptoms. Pertinent negatives for diabetes include no blurred vision, no fatigue and no foot paresthesias. Symptoms are stable. Pertinent negatives for diabetic complications include no CVA or heart disease. Risk factors for coronary artery disease include dyslipidemia, diabetes mellitus, male sex, hypertension and sedentary lifestyle. He is following a generally unhealthy diet. (Does not check BS ) An ACE inhibitor/angiotensin II receptor blocker is being taken. Eye exam is not current.  Hypertension This is a chronic problem. The current episode started more than 1 year ago. The problem is uncontrolled. Pertinent negatives include no blurred vision, malaise/fatigue, peripheral edema or shortness of breath. Risk factors for coronary artery disease include dyslipidemia, diabetes mellitus, obesity and sedentary lifestyle. There is no history of CAD/MI or CVA. Identifiable causes of hypertension include a thyroid problem.  Arthritis Presents for follow-up visit. He complains of pain and stiffness. Affected locations include the right knee and left knee. His pain is at a severity of 8/10. Pertinent negatives include no fatigue.  Gastroesophageal Reflux He complains of belching and heartburn. He reports no coughing. The current episode started more than 1 year ago. The problem occurs occasionally. Pertinent negatives include no fatigue. Risk factors include obesity. He has tried a PPI for the symptoms. The  treatment provided moderate relief.  Thyroid Problem Presents for follow-up visit. Patient reports no depressed mood, fatigue or heat intolerance. The symptoms have been stable.      Review of Systems  Constitutional: Negative for fatigue and malaise/fatigue.  Eyes: Negative for blurred vision.  Respiratory: Negative for cough and shortness of breath.   Gastrointestinal: Positive for heartburn.  Endocrine: Negative for heat intolerance.  Musculoskeletal: Positive for arthritis and stiffness.  All other systems reviewed and are negative.      Objective:   Physical Exam Vitals signs reviewed.  Constitutional:      General: He is not in acute distress.    Appearance: He is well-developed. He is obese.  HENT:     Head: Normocephalic.     Right Ear: Tympanic membrane normal.     Left Ear: Tympanic membrane normal.  Eyes:     General:        Right eye: No discharge.        Left eye: No discharge.     Pupils: Pupils are equal, round, and reactive to light.  Neck:     Musculoskeletal: Normal range of motion and neck supple.     Thyroid: No thyromegaly.  Cardiovascular:     Rate and Rhythm: Normal rate and regular rhythm.     Heart sounds: Normal heart sounds. No murmur.  Pulmonary:     Effort: Pulmonary effort is normal. No respiratory distress.     Breath sounds: Normal breath sounds. No wheezing.  Abdominal:     General: Bowel sounds are normal. There is no distension.     Palpations: Abdomen is soft.     Tenderness: There is no abdominal tenderness.  Musculoskeletal: Normal  range of motion.        General: No tenderness.  Skin:    General: Skin is warm and dry.     Findings: No erythema or rash.  Neurological:     Mental Status: He is alert and oriented to person, place, and time.     Cranial Nerves: No cranial nerve deficit.     Deep Tendon Reflexes: Reflexes are normal and symmetric.  Psychiatric:        Behavior: Behavior normal.        Thought Content: Thought  content normal.        Judgment: Judgment normal.        BP 130/81   Pulse 72   Temp 98.2 F (36.8 C) (Temporal)   Ht _0  (1.651 m)   Wt (!) 341 lb 9.6 oz (154.9 kg)   SpO2 96%   BMI 56.85 kg/m   Assessment & Plan:  Ernest Martinez comes in today with chief complaint of Medical Management of Chronic Issues (three  month recheck)   Diagnosis and orders addressed:  1. Hypertension associated with diabetes ([place]) - CMP14+EGFR - CBC with Differential/Platelet  2. Gastroesophageal reflux disease, esophagitis presence not specified - CMP14+EGFR - CBC with Differential/Platelet  3. Type 2 diabetes mellitus with hyperglycemia, without long-term current use of insulin (HCC) - CMP14+EGFR - CBC with Differential/Platelet - Bayer DCA Hb A1c Waived  4. Hyperlipidemia associated with type 2 diabetes mellitus ([place]) - CMP14+EGFR - CBC with Differential/Platelet - Lipid panel  5. Arthritis - CMP14+EGFR - CBC with Differential/Platelet - traMADol (ULTRAM) 50 MG tablet; Take 2 tablets (100 mg total) by mouth every 12 (twelve) hours as needed.  Dispense: 120 tablet; Refill: 2  6. Pain management contract signed - CMP14+EGFR - CBC with Differential/Platelet - traMADol (ULTRAM) 50 MG tablet; Take 2 tablets (100 mg total) by mouth every 12 (twelve) hours as needed.  Dispense: 120 tablet; Refill: 2  7. Morbid obesity ([place]) - CMP14+EGFR - CBC with Differential/Platelet  8. Uncomplicated opioid dependence (HCC) - CMP14+EGFR - CBC with Differential/Platelet - traMADol (ULTRAM) 50 MG tablet; Take 2 tablets (100 mg total) by mouth every 12 (twelve) hours as needed.  Dispense: 120 tablet; Refill: 2  9. Colon cancer screening - Cologuard  10. Screening for malignant neoplasm of the rectum - Cologuard   Labs pending Pt reviewed in [place] controlled database- No red flags noted  Health Maintenance reviewed Diet and exercise encouraged  Follow up plan: 3 months and follow up with  Endo.    Evelina Dun, FNP

## 2019-06-21 NOTE — Telephone Encounter (Signed)
Pt's wife called and said that he never had his thyroid scan and wants to know if that can be set back up

## 2019-06-21 NOTE — Patient Instructions (Signed)

## 2019-06-22 ENCOUNTER — Other Ambulatory Visit: Payer: Self-pay | Admitting: Family

## 2019-06-23 DIAGNOSIS — I10 Essential (primary) hypertension: Secondary | ICD-10-CM | POA: Diagnosis not present

## 2019-06-23 DIAGNOSIS — W269XXA Contact with unspecified sharp object(s), initial encounter: Secondary | ICD-10-CM | POA: Diagnosis not present

## 2019-06-23 DIAGNOSIS — Z79899 Other long term (current) drug therapy: Secondary | ICD-10-CM | POA: Diagnosis not present

## 2019-06-23 DIAGNOSIS — S01312A Laceration without foreign body of left ear, initial encounter: Secondary | ICD-10-CM | POA: Diagnosis not present

## 2019-06-25 DIAGNOSIS — I10 Essential (primary) hypertension: Secondary | ICD-10-CM | POA: Diagnosis not present

## 2019-06-25 DIAGNOSIS — Z79899 Other long term (current) drug therapy: Secondary | ICD-10-CM | POA: Diagnosis not present

## 2019-06-25 DIAGNOSIS — Z4800 Encounter for change or removal of nonsurgical wound dressing: Secondary | ICD-10-CM | POA: Diagnosis not present

## 2019-06-25 DIAGNOSIS — X58XXXD Exposure to other specified factors, subsequent encounter: Secondary | ICD-10-CM | POA: Diagnosis not present

## 2019-06-25 DIAGNOSIS — S01311D Laceration without foreign body of right ear, subsequent encounter: Secondary | ICD-10-CM | POA: Diagnosis not present

## 2019-06-28 ENCOUNTER — Other Ambulatory Visit (HOSPITAL_COMMUNITY): Payer: Medicare Other

## 2019-06-29 ENCOUNTER — Other Ambulatory Visit (HOSPITAL_COMMUNITY): Payer: Medicare Other

## 2019-07-05 ENCOUNTER — Other Ambulatory Visit (HOSPITAL_COMMUNITY): Payer: Medicare Other

## 2019-07-06 ENCOUNTER — Other Ambulatory Visit (HOSPITAL_COMMUNITY): Payer: Medicare Other

## 2019-07-07 DIAGNOSIS — S01312D Laceration without foreign body of left ear, subsequent encounter: Secondary | ICD-10-CM | POA: Diagnosis not present

## 2019-07-09 ENCOUNTER — Ambulatory Visit (INDEPENDENT_AMBULATORY_CARE_PROVIDER_SITE_OTHER): Payer: Medicare Other | Admitting: *Deleted

## 2019-07-09 DIAGNOSIS — Z0000 Encounter for general adult medical examination without abnormal findings: Secondary | ICD-10-CM

## 2019-07-09 NOTE — Progress Notes (Addendum)
MEDICARE ANNUAL WELLNESS VISIT  07/09/2019  Telephone Visit Disclaimer This Medicare AWV was conducted by telephone due to national recommendations for restrictions regarding the COVID-19 Pandemic (e.g. social distancing).  I verified, using two identifiers, that I am speaking with Ernest Martinez or their authorized healthcare agent. I discussed the limitations, risks, security, and privacy concerns of performing an evaluation and management service by telephone and the potential availability of an in-person appointment in the future. The patient expressed understanding and agreed to proceed.   Subjective:  Ernest Martinez is a 63 y.o. male patient of Hawks, Theador Hawthorne, FNP who had a Medicare Annual Wellness Visit today via telephone. 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 he has 2 children. he reports that he is socially active and does interact with friends/family regularly. he is minimally physically active and enjoys riding around, going to the [place].  Patient Care Team: Sharion Balloon, FNP as PCP - General (Family Medicine)  Advanced Directives 07/09/2019 07/01/2018 03/06/2017 10/25/2014 08/10/2013 03/28/2013  Does Patient Have a Medical Advance Directive? No No No No Patient does not have advance directive Patient does not have advance directive  Would patient like information on creating a medical advance directive? No - Patient declined Yes (MAU/Ambulatory/Procedural Areas - Information given) - - - -  Pre-existing out of facility DNR order (yellow form or pink MOST form) - - - - No -    Hospital Utilization Over the Past 12 Months: # of hospitalizations or ER visits: 1 # of surgeries: 0  Review of Systems    Patient reports that his overall health is unchanged compared to last year.  History obtained from chart review  Patient Reported Readings (BP, Pulse, CBG, Weight, etc) none  Pain Assessment Pain : 0-10 Pain Score:  8  Pain Type: Chronic pain Pain Location: Knee Pain Orientation: Other (Comment)(bilateral knee pain) Pain Descriptors / Indicators: Nagging Pain Onset: Other (comment)(4 years) Pain Frequency: Constant Pain Relieving Factors: pain medication Effect of Pain on Daily Activities: he continues to do daily activities "but it hurts"  Pain Relieving Factors: pain medication  Current Medications & Allergies (verified) Allergies as of 07/09/2019      Reactions   Lovastatin    Headache and burning in stomach.   Metoprolol Tartrate    headache      Medication List       Accurate as of July 09, 2019  9:05 AM. If you have any questions, ask your nurse or doctor.        amLODipine 10 MG tablet Commonly known as: NORVASC Take 1 tablet (10 mg total) by mouth every morning.   aspirin EC 81 MG tablet Take 1 tablet (81 mg total) by mouth daily.   celecoxib 200 MG capsule Commonly known as: CELEBREX TAKE ONE (1) CAPSULE EACH DAY   furosemide 20 MG tablet Commonly known as: LASIX Take 1 tablet (20 mg total) by mouth every morning.   lisinopril-hydrochlorothiazide 20-12.5 MG tablet Commonly known as: ZESTORETIC TAKE ONE (1) TABLET EACH DAY   metFORMIN 1000 MG tablet Commonly known as: GLUCOPHAGE TAKE ONE TABLET TWICE A DAY WITH FOOD   omeprazole 20 MG capsule Commonly known as: PRILOSEC TAKE ONE CAPSULE EACH MORNING   rOPINIRole 1 MG tablet Commonly known as: REQUIP TAKE ONE TABLET DAILY AT BEDTIME   traMADol 50 MG tablet Commonly known as: ULTRAM Take 2 tablets (100 mg total) by  mouth every 12 (twelve) hours as needed.       History (reviewed): Past Medical History:  Diagnosis Date  . Abnormal EKG   . Bacteremia due to Gram-negative bacteria    a. 03/2013: klebsiella UTI with bacteremia.  . Bladder filling defect    a. 03/2013: Filling defect at base of bladder by CT pelvis, for f/u urology.  . Borderline diabetes   . Dislocation of right shoulder joint sept  2014   after fall  . GERD (gastroesophageal reflux disease)   . Hydrocele of testis    a. 03/2013: US showing Large right testicular hydrocele and small left hydrocele.  Marland Kitchen Hypertension   . Morbid obesity ([place])    Past Surgical History:  Procedure Laterality Date  . HYDROCELE EXCISION Right 08/16/2013   Procedure: HYDROCELECTOMY ADULT  Procedure: Right Scrotal Hydrocele Repair;  Surgeon: Bernestine Amass, MD;  Location: [facility];  Service: Urology;  Laterality: Right;  . None     Family History  Problem Relation Age of Onset  . Cancer Father        prostate cancer  . CAD Neg Hx    Social History   Socioeconomic History  . Marital status: Married    Spouse name: [name]  . Number of children: 2  . Years of education: 26  . Highest education level: High school graduate  Occupational History  . Occupation: Merchandiser, retail: SELF    Comment: on disability  Social Needs  . Financial resource strain: Not hard at all  . Food insecurity    Worry: Never true    Inability: Never true  . Transportation needs    Medical: No    Non-medical: No  Tobacco Use  . Smoking status: Never Smoker  . Smokeless tobacco: Never Used  Substance and Sexual Activity  . Alcohol use: Yes    Comment: socially-maybe 6 beers a month  . Drug use: No  . Sexual activity: Not Currently  Lifestyle  . Physical activity    Days per week: 7 days    Minutes per session: 10 min  . Stress: Not at all  Relationships  . Social connections    Talks on phone: More than three times a week    Gets together: More than three times a week    Attends religious service: More than 4 times per year    Active member of club or organization: No    Attends meetings of clubs or organizations: Never    Relationship status: Married  Other Topics Concern  . Not on file  Social History Narrative  . Not on file    Activities of Daily Living In your present state of health, do you have any difficulty performing  the following activities: 07/09/2019  Hearing? N  Vision? N  Comment he usually gets yearly eye exams but he is behind this year due to CoVID-19  Difficulty concentrating or making decisions? N  Walking or climbing stairs? Y  Comment due to his bilateral knee pain, doesn't have to use a cane/walker  Dressing or bathing? N  Doing errands, shopping? N  Preparing Food and eating ? N  Using the Toilet? N  In the past six months, have you accidently leaked urine? N  Do you have problems with loss of bowel control? N  Managing your Medications? N  Managing your Finances? N  Housekeeping or managing your Housekeeping? N  Some recent data might be hidden    Patient  Education/ Literacy How often do you need to have someone help you when you read instructions, pamphlets, or other written materials from your doctor or pharmacy?: 1 - Never What is the last grade level you completed in school?: 12th grade  Exercise Current Exercise Habits: Home exercise routine, Type of exercise: walking, Time (Minutes): 10, Frequency (Times/Week): 7, Weekly Exercise (Minutes/Week): 70, Intensity: Mild, Exercise limited by: orthopedic condition(s)  Diet Patient reports consuming 2 meals a day and 4 snack(s) a day Patient reports that his primary diet is: Regular Patient reports that she does have regular access to food.   Depression Screen PHQ 2/9 Scores 07/09/2019 06/21/2019 03/23/2019 12/21/2018 09/17/2018 07/27/2018 07/01/2018  PHQ - 2 Score 0 0 0 0 0 0 0     Fall Risk Fall Risk  07/09/2019 06/21/2019 03/23/2019 12/21/2018 09/17/2018  Falls in the past year? 0 0 0 0 0  Number falls in past yr: 0 - - - -  Injury with Fall? 0 - - - -  Risk for fall due to : Impaired mobility - - - -  Risk for fall due to: Comment due to his knee pain-they give out on him sometimes - - - -  Follow up Falls prevention discussed - - - -  Comment No throw rugs in the house, no small animals in the house, adequate lighting in the  walkways and grab bars in the bathroom - - - -     Objective:  Ernest Martinez seemed alert and oriented and he participated appropriately during our telephone visit.  Blood Pressure Weight BMI  BP Readings from Last 3 Encounters:  06/21/19 130/81  03/23/19 131/81  03/08/19 129/84   Wt Readings from Last 3 Encounters:  06/21/19 (!) 341 lb 9.6 oz (154.9 kg)  03/23/19 (!) 343 lb (155.6 kg)  03/08/19 (!) 344 lb (156 kg)   BMI Readings from Last 1 Encounters:  06/21/19 56.85 kg/m    *Unable to obtain current vital signs, weight, and BMI due to telephone visit type  Hearing/Vision  . Nnaemeka did not seem to have difficulty with hearing/understanding during the telephone conversation . Reports that he has not had a formal eye exam by an eye care professional within the past year . Reports that he has not had a formal hearing evaluation within the past year *Unable to fully assess hearing and vision during telephone visit type  Cognitive Function: 6CIT Screen 07/09/2019  What Year? 0 points  What month? 0 points  What time? 0 points  Count back from 20 0 points  Months in reverse 0 points  Repeat phrase 0 points  Total Score 0   (Normal:0-7, Significant for Dysfunction: >8)  Normal Cognitive Function Screening: Yes   Immunization & Health Maintenance Record  There is no immunization history on file for this patient.  Health Maintenance  Topic Date Due  . COLON CANCER SCREENING ANNUAL FOBT  07/28/2006  . COLONOSCOPY  07/28/2006  . OPHTHALMOLOGY EXAM  10/01/2017  . INFLUENZA VACCINE  02/11/2020 (Originally 04/24/2019)  . PNEUMOCOCCAL POLYSACCHARIDE VACCINE AGE 22-64 HIGH RISK  06/20/2020 (Originally 07/28/1958)  . FOOT EXAM  09/18/2019  . HEMOGLOBIN A1C  12/19/2019  . TETANUS/TDAP  07/12/2025  . Hepatitis C Screening  Completed  . HIV Screening  Completed       Assessment  This is a routine wellness examination for GLENDAL CASSADAY.  Health Maintenance: Due or Overdue  Health Maintenance Due  Topic Date Due  . COLON CANCER  SCREENING ANNUAL FOBT  07/28/2006  . COLONOSCOPY  07/28/2006  . OPHTHALMOLOGY EXAM  10/01/2017    Ernest Martinez does not need a referral for Community Assistance: Care Management:   no Social Work:    no Prescription Assistance:  no Nutrition/Diabetes Education:  no   Plan:  Personalized Goals Goals Addressed            This Visit's Progress   . DIET - INCREASE WATER INTAKE       Try to drink 6-8 glasses of water daily.      Personalized Health Maintenance & Screening Recommendations  Colorectal cancer screening pt declines all recommended vaccines  Lung Cancer Screening Recommended: no (Low Dose CT Chest recommended if Age 64-80 years, 30 pack-year currently smoking OR have quit w/in past 15 years) Hepatitis C Screening recommended: no HIV Screening recommended: no  Advanced Directives: Written information was not prepared per patient's request.  Referrals & Orders No orders of the defined types were placed in this encounter.   Follow-up Plan . Follow-up with Sharion Balloon, FNP as planned . Complete the Cologuard kit and mail it back as discussed . Schedule your Diabetic Eye Exam as discussed   I have personally reviewed and noted the following in the patient's chart:   . Medical and social history . Use of alcohol, tobacco or illicit drugs  . Current medications and supplements . Functional ability and status . Nutritional status . Physical activity . Advanced directives . List of other physicians . Hospitalizations, surgeries, and ER visits in previous 12 months . Vitals . Screenings to include cognitive, depression, and falls . Referrals and appointments  In addition, I have reviewed and discussed with Ernest Martinez certain preventive protocols, quality metrics, and best practice recommendations. A written personalized care plan for preventive services as well as general preventive health  recommendations is available and can be mailed to the patient at his request.      Milas Hock, LPN  [id number]/81/[id number]    I have reviewed and agree with the above AWV documentation.   Evelina Dun, FNP

## 2019-07-09 NOTE — Patient Instructions (Signed)
Preventive Care 40-64 Years Old, Male Preventive care refers to lifestyle choices and visits with your health care provider that can promote health and wellness. This includes:  A yearly physical exam. This is also called an annual well check.  Regular dental and eye exams.  Immunizations.  Screening for certain conditions.  Healthy lifestyle choices, such as eating a healthy diet, getting regular exercise, not using drugs or products that contain nicotine and tobacco, and limiting alcohol use. What can I expect for my preventive care visit? Physical exam Your health care provider will check:  Height and weight. These may be used to calculate body mass index (BMI), which is a measurement that tells if you are at a healthy weight.  Heart rate and blood pressure.  Your skin for abnormal spots. Counseling Your health care provider may ask you questions about:  Alcohol, tobacco, and drug use.  Emotional well-being.  Home and relationship well-being.  Sexual activity.  Eating habits.  Work and work environment. What immunizations do I need?  Influenza (flu) vaccine  This is recommended every year. Tetanus, diphtheria, and pertussis (Tdap) vaccine  You may need a Td booster every 10 years. Varicella (chickenpox) vaccine  You may need this vaccine if you have not already been vaccinated. Zoster (shingles) vaccine  You may need this after age 60. Measles, mumps, and rubella (MMR) vaccine  You may need at least one dose of MMR if you were born in 1957 or later. You may also need a second dose. Pneumococcal conjugate (PCV13) vaccine  You may need this if you have certain conditions and were not previously vaccinated. Pneumococcal polysaccharide (PPSV23) vaccine  You may need one or two doses if you smoke cigarettes or if you have certain conditions. Meningococcal conjugate (MenACWY) vaccine  You may need this if you have certain conditions. Hepatitis A vaccine   You may need this if you have certain conditions or if you travel or work in places where you may be exposed to hepatitis A. Hepatitis B vaccine  You may need this if you have certain conditions or if you travel or work in places where you may be exposed to hepatitis B. Haemophilus influenzae type b (Hib) vaccine  You may need this if you have certain risk factors. Human papillomavirus (HPV) vaccine  If recommended by your health care provider, you may need three doses over 6 months. You may receive vaccines as individual doses or as more than one vaccine together in one shot (combination vaccines). Talk with your health care provider about the risks and benefits of combination vaccines. What tests do I need? Blood tests  Lipid and cholesterol levels. These may be checked every 5 years, or more frequently if you are over 50 years old.  Hepatitis C test.  Hepatitis B test. Screening  Lung cancer screening. You may have this screening every year starting at age 55 if you have a 30-pack-year history of smoking and currently smoke or have quit within the past 15 years.  Prostate cancer screening. Recommendations will vary depending on your family history and other risks.  Colorectal cancer screening. All adults should have this screening starting at age 50 and continuing until age 75. Your health care provider may recommend screening at age 45 if you are at increased risk. You will have tests every 1-10 years, depending on your results and the type of screening test.  Diabetes screening. This is done by checking your blood sugar (glucose) after you have not eaten   for a while (fasting). You may have this done every 1-3 years.  Sexually transmitted disease (STD) testing. Follow these instructions at home: Eating and drinking  Eat a diet that includes fresh fruits and vegetables, whole grains, lean protein, and low-fat dairy products.  Take vitamin and mineral supplements as recommended  by your health care provider.  Do not drink alcohol if your health care provider tells you not to drink.  If you drink alcohol: ? Limit how much you have to 0-2 drinks a day. ? Be aware of how much alcohol is in your drink. In the [place] one drink equals one 12 oz bottle of beer (355 mL), one 5 oz glass of wine (148 mL), or one 1 oz glass of hard liquor (44 mL). Lifestyle  Take daily care of your teeth and gums.  Stay active. Exercise for at least 30 minutes on 5 or more days each week.  Do not use any products that contain nicotine or tobacco, such as cigarettes, e-cigarettes, and chewing tobacco. If you need help quitting, ask your health care provider.  If you are sexually active, practice safe sex. Use a condom or other form of protection to prevent STIs (sexually transmitted infections).  Talk with your health care provider about taking a low-dose aspirin every day starting at age 33. What's next?  Go to your health care provider once a year for a well check visit.  Ask your health care provider how often you should have your eyes and teeth checked.  Stay up to date on all vaccines. This information is not intended to replace advice given to you by your health care provider. Make sure you discuss any questions you have with your health care provider. Document Released: 10/06/2015 Document Revised: 09/03/2018 Document Reviewed: 09/03/2018 Elsevier Patient Education  2020 [employer].

## 2019-07-26 ENCOUNTER — Encounter (HOSPITAL_COMMUNITY)
Admission: RE | Admit: 2019-07-26 | Discharge: 2019-07-26 | Disposition: A | Discharge status: Home / Self Care | Payer: Medicare Other | Source: Ambulatory Visit | Attending: "Endocrinology | Admitting: "Endocrinology

## 2019-07-26 ENCOUNTER — Other Ambulatory Visit: Payer: Self-pay

## 2019-07-26 ENCOUNTER — Encounter (HOSPITAL_COMMUNITY): Payer: Self-pay

## 2019-07-26 DIAGNOSIS — E0590 Thyrotoxicosis, unspecified without thyrotoxic crisis or storm: Secondary | ICD-10-CM | POA: Insufficient documentation

## 2019-07-26 HISTORY — DX: Type 2 diabetes mellitus without complications: E11.9

## 2019-07-26 MED ORDER — SODIUM IODIDE I-123 7.4 MBQ CAPS
316.0000 | ORAL_CAPSULE | Freq: Once | ORAL | Status: AC
  Administered 2019-07-26: 316 via ORAL

## 2019-07-27 ENCOUNTER — Encounter (HOSPITAL_COMMUNITY)
Admission: RE | Admit: 2019-07-27 | Discharge: 2019-07-27 | Disposition: A | Discharge status: Home / Self Care | Payer: Medicare Other | Source: Ambulatory Visit | Attending: "Endocrinology | Admitting: "Endocrinology

## 2019-07-27 DIAGNOSIS — E0590 Thyrotoxicosis, unspecified without thyrotoxic crisis or storm: Secondary | ICD-10-CM | POA: Diagnosis not present

## 2019-07-27 DIAGNOSIS — D34 Benign neoplasm of thyroid gland: Secondary | ICD-10-CM | POA: Diagnosis not present

## 2019-08-03 ENCOUNTER — Ambulatory Visit (INDEPENDENT_AMBULATORY_CARE_PROVIDER_SITE_OTHER): Payer: Medicare Other | Admitting: "Endocrinology

## 2019-08-03 ENCOUNTER — Encounter: Payer: Self-pay | Admitting: "Endocrinology

## 2019-08-03 ENCOUNTER — Other Ambulatory Visit: Payer: Self-pay

## 2019-08-03 DIAGNOSIS — E0510 Thyrotoxicosis with toxic single thyroid nodule without thyrotoxic crisis or storm: Secondary | ICD-10-CM | POA: Diagnosis not present

## 2019-08-03 NOTE — Progress Notes (Signed)
08/03/2019, 9:14 AM                                 Endocrinology Telehealth Visit Follow up Note -During COVID -19 Pandemic  I connected with Ernest Martinez on 08/03/2019   by telephone and verified that I am speaking with the correct person using two identifiers. Ernest Martinez, 08-23-1956. he has verbally consented to this visit. All issues noted in this document were discussed and addressed. The format was not optimal for physical exam.  Subjective:    Patient ID: Ernest Martinez, male    DOB: 05-Jun-1956, PCP Sharion Balloon, FNP   Past Medical History:  Diagnosis Date  . Abnormal EKG   . Bacteremia due to Gram-negative bacteria    a. 03/2013: klebsiella UTI with bacteremia.  . Bladder filling defect    a. 03/2013: Filling defect at base of bladder by CT pelvis, for f/u urology.  . Borderline diabetes   . Diabetes mellitus without complication ([place])   . Dislocation of right shoulder joint sept 2014   after fall  . GERD (gastroesophageal reflux disease)   . Hydrocele of testis    a. 03/2013: US showing Large right testicular hydrocele and small left hydrocele.  Marland Kitchen Hypertension   . Morbid obesity ([place])    Past Surgical History:  Procedure Laterality Date  . HYDROCELE EXCISION Right 08/16/2013   Procedure: HYDROCELECTOMY ADULT  Procedure: Right Scrotal Hydrocele Repair;  Surgeon: Bernestine Amass, MD;  Location: [facility];  Service: Urology;  Laterality: Right;  . None     Social History   Socioeconomic History  . Marital status: Married    Spouse name: [name]  . Number of children: 2  . Years of education: 90  . Highest education level: High school graduate  Occupational History  . Occupation: Merchandiser, retail: SELF    Comment: on disability  Social Needs  . Financial resource strain: Not hard at all  . Food insecurity    Worry: Never true    Inability: Never true  . Transportation needs    Medical: No     Non-medical: No  Tobacco Use  . Smoking status: Never Smoker  . Smokeless tobacco: Never Used  Substance and Sexual Activity  . Alcohol use: Yes    Comment: socially-maybe 6 beers a month  . Drug use: No  . Sexual activity: Not Currently  Lifestyle  . Physical activity    Days per week: 7 days    Minutes per session: 10 min  . Stress: Not at all  Relationships  . Social connections    Talks on phone: More than three times a week    Gets together: More than three times a week    Attends religious service: More than 4 times per year    Active member of club or organization: No    Attends meetings of clubs or organizations: Never    Relationship status: Married  Other Topics Concern  . Not on file  Social History Narrative  . Not on file   Family History  Problem Relation Age of Onset  .  Cancer Father        prostate cancer  . CAD Neg Hx    Outpatient Encounter Medications as of 08/03/2019  Medication Sig  . amLODipine (NORVASC) 10 MG tablet Take 1 tablet (10 mg total) by mouth every morning.  Marland Kitchen aspirin EC 81 MG tablet Take 1 tablet (81 mg total) by mouth daily.  . celecoxib (CELEBREX) 200 MG capsule TAKE ONE (1) CAPSULE EACH DAY  . furosemide (LASIX) 20 MG tablet Take 1 tablet (20 mg total) by mouth every morning.  Marland Kitchen lisinopril-hydrochlorothiazide (PRINZIDE,ZESTORETIC) 20-12.5 MG tablet TAKE ONE (1) TABLET EACH DAY  . metFORMIN (GLUCOPHAGE) 1000 MG tablet TAKE ONE TABLET TWICE A DAY WITH FOOD  . omeprazole (PRILOSEC) 20 MG capsule TAKE ONE CAPSULE EACH MORNING  . rOPINIRole (REQUIP) 1 MG tablet TAKE ONE TABLET DAILY AT BEDTIME  . traMADol (ULTRAM) 50 MG tablet Take 2 tablets (100 mg total) by mouth every 12 (twelve) hours as needed.   No facility-administered encounter medications on file as of 08/03/2019.    ALLERGIES: Allergies  Allergen Reactions  . Lovastatin     Headache and burning in stomach.  . Metoprolol Tartrate     headache    VACCINATION  STATUS:  There is no immunization history on file for this patient.  HPI Ernest Martinez is 63 y.o. male who presents today with a medical history as above. he is being engaged in telehealth via telephone after he was seen in consultation for suppressed TSH requested by Sharion Balloon, FNP. 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  He subsequently underwent fine-needle aspiration of this nodule with benign findings.  Subsequently did not require any antithyroid intervention.  His TSH was suppressed in September 2019 at 0.065.  Repeat labs on December 21, 2018 also showed suppressed TSH at 0.161.  -His most recent full profile thyroid function tests are consistent with significantly suppressed TSH.  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   He reports increased appetite, some unquantified weight gain over several month period of time.  He denies palpitations, tremors, heat or cold intolerance.  Review of Systems  Limited as above  Objective:    There were no vitals taken for this visit.  Wt Readings from Last 3 Encounters:  06/21/19 (!) 341 lb 9.6 oz (154.9 kg)  03/23/19 (!) 343 lb (155.6 kg)  03/08/19 (!) 344 lb (156 kg)    Physical Exam  CMP     Component Value Date/Time   NA 141 06/21/2019 0918   K 3.8 06/21/2019 0918   CL 100 06/21/2019 0918   CO2 28 06/21/2019 0918   GLUCOSE 123 (H) 06/21/2019 0918   GLUCOSE 112 (H) 08/10/2013 1405   BUN 11 06/21/2019 0918   CREATININE 0.96 06/21/2019 0918   CALCIUM 9.9 06/21/2019 0918   PROT 6.6 06/21/2019 0918   ALBUMIN 4.0 06/21/2019 0918   AST 9 06/21/2019 0918   ALT 17 06/21/2019 0918   ALKPHOS 62 06/21/2019 0918   BILITOT 0.4 06/21/2019 0918   GFRNONAA 84 06/21/2019 0918   GFRAA 98 06/21/2019 0918     Diabetic Labs (most  recent): Lab Results  Component Value Date   HGBA1C 5.9 06/21/2019   HGBA1C 5.9 12/21/2018   HGBA1C 5.9 09/17/2018  Lipid Panel ( most recent) Lipid Panel     Component Value Date/Time   CHOL 171 06/21/2019 0918   TRIG 80 06/21/2019 0918   HDL 55 06/21/2019 0918   CHOLHDL 3.1 06/21/2019 0918   LDLCALC 101 (H) 06/21/2019 0918      Lab Results  Component Value Date   TSH 0.21 (L) 03/08/2019   TSH 0.161 (L) 12/21/2018   TSH 0.065 (L) 06/18/2018   FREET4 1.2 03/08/2019      Assessment & Plan:   1.  Toxic nodule /hyperthyroidism  - Ernest Martinez  is being seen at a kind request of Sharion Balloon, FNP. -His previsit thyroid function tests are still consistent with suppressed TSH and toxic nodule on the left lobe of his thyroid. -  he has had nodular goiter fully worked up in 2012 with benign biopsy.  However, most recently, his lab information is not sufficient to make a treatment plan today.  -He would benefit from antithyroid treatment.  Options were discussed with him including I-131 thyroid ablation.  He understands the possibility of subsequent hypothyroidism which would require thyroid hormone replacement. This treatment will be scheduled to be administered in [facility] in the next several days.  He will return in 9 weeks with repeat thyroid function tests for reassessment.  - I advised him  to maintain close follow up with Sharion Balloon, FNP for primary care needs.   Time for this visit: 15 minutes. Ernest Martinez  participated in the discussions, expressed understanding, and voiced agreement with the above plans.  All questions were answered to his satisfaction. he is encouraged to contact clinic should he have any questions or concerns prior to his return visit.   Follow up plan: Return in about 9 weeks (around 10/05/2019) for Follow up with Labs after I131 Therapy.   Glade Lloyd, MD [facility] [address] [place], [place] [address] Phone: [phone]  Fax: [phone]     08/03/2019, 9:14 AM  This note was partially dictated with voice recognition software. Similar sounding words can be transcribed inadequately or may not  be corrected upon review.

## 2019-08-04 ENCOUNTER — Other Ambulatory Visit: Payer: Self-pay | Admitting: Family

## 2019-08-04 DIAGNOSIS — I10 Essential (primary) hypertension: Secondary | ICD-10-CM

## 2019-08-13 ENCOUNTER — Encounter (HOSPITAL_COMMUNITY): Admission: RE | Admit: 2019-08-13 | Payer: Medicare Other | Source: Ambulatory Visit

## 2019-08-13 ENCOUNTER — Telehealth: Payer: Self-pay | Admitting: "Endocrinology

## 2019-08-13 NOTE — Telephone Encounter (Signed)
Ryan with [facility] called and said he no showed appt today, he finally got a hold of his wife & was advised that they were never aware of this and he is out of town. He is rescheduled for Wednesday 11/25.

## 2019-08-16 ENCOUNTER — Other Ambulatory Visit: Payer: Self-pay | Admitting: Family

## 2019-08-16 DIAGNOSIS — G2581 Restless legs syndrome: Secondary | ICD-10-CM

## 2019-08-16 NOTE — Telephone Encounter (Signed)
Noted. Pts wife was notified of last appt date and time. Noted in referral

## 2019-08-18 ENCOUNTER — Ambulatory Visit (HOSPITAL_COMMUNITY)
Admission: RE | Admit: 2019-08-18 | Discharge: 2019-08-18 | Disposition: A | Discharge status: Home / Self Care | Payer: Medicare Other | Source: Ambulatory Visit | Attending: "Endocrinology | Admitting: "Endocrinology

## 2019-08-18 ENCOUNTER — Other Ambulatory Visit: Payer: Self-pay

## 2019-08-18 DIAGNOSIS — E0510 Thyrotoxicosis with toxic single thyroid nodule without thyrotoxic crisis or storm: Secondary | ICD-10-CM | POA: Insufficient documentation

## 2019-08-18 DIAGNOSIS — E0590 Thyrotoxicosis, unspecified without thyrotoxic crisis or storm: Secondary | ICD-10-CM | POA: Diagnosis not present

## 2019-08-18 MED ORDER — SODIUM IODIDE I 131 CAPSULE
30.0000 | Freq: Once | INTRAVENOUS | Status: AC | PRN
  Administered 2019-08-18: 30.5 via ORAL

## 2019-09-11 ENCOUNTER — Other Ambulatory Visit: Payer: Self-pay | Admitting: Family

## 2019-09-11 DIAGNOSIS — Z0289 Encounter for other administrative examinations: Secondary | ICD-10-CM

## 2019-09-11 DIAGNOSIS — F1120 Opioid dependence, uncomplicated: Secondary | ICD-10-CM

## 2019-09-11 DIAGNOSIS — M1990 Unspecified osteoarthritis, unspecified site: Secondary | ICD-10-CM

## 2019-09-27 ENCOUNTER — Other Ambulatory Visit: Payer: Self-pay | Admitting: "Endocrinology

## 2019-09-27 ENCOUNTER — Encounter: Payer: Self-pay | Admitting: Family

## 2019-09-27 ENCOUNTER — Ambulatory Visit (INDEPENDENT_AMBULATORY_CARE_PROVIDER_SITE_OTHER): Payer: Medicare Other | Admitting: Family

## 2019-09-27 ENCOUNTER — Other Ambulatory Visit: Payer: Self-pay

## 2019-09-27 VITALS — BP 139/83 | HR 78 | Temp 97.1°F | Ht 65.0 in | Wt 346.0 lb

## 2019-09-27 DIAGNOSIS — E1159 Type 2 diabetes mellitus with other circulatory complications: Secondary | ICD-10-CM | POA: Diagnosis not present

## 2019-09-27 DIAGNOSIS — F1120 Opioid dependence, uncomplicated: Secondary | ICD-10-CM

## 2019-09-27 DIAGNOSIS — Z0289 Encounter for other administrative examinations: Secondary | ICD-10-CM

## 2019-09-27 DIAGNOSIS — E1165 Type 2 diabetes mellitus with hyperglycemia: Secondary | ICD-10-CM | POA: Diagnosis not present

## 2019-09-27 DIAGNOSIS — E0590 Thyrotoxicosis, unspecified without thyrotoxic crisis or storm: Secondary | ICD-10-CM

## 2019-09-27 DIAGNOSIS — E1169 Type 2 diabetes mellitus with other specified complication: Secondary | ICD-10-CM

## 2019-09-27 DIAGNOSIS — I10 Essential (primary) hypertension: Secondary | ICD-10-CM | POA: Diagnosis not present

## 2019-09-27 DIAGNOSIS — M1990 Unspecified osteoarthritis, unspecified site: Secondary | ICD-10-CM

## 2019-09-27 DIAGNOSIS — K219 Gastro-esophageal reflux disease without esophagitis: Secondary | ICD-10-CM | POA: Diagnosis not present

## 2019-09-27 DIAGNOSIS — E785 Hyperlipidemia, unspecified: Secondary | ICD-10-CM

## 2019-09-27 DIAGNOSIS — E0510 Thyrotoxicosis with toxic single thyroid nodule without thyrotoxic crisis or storm: Secondary | ICD-10-CM

## 2019-09-27 LAB — BAYER DCA HB A1C WAIVED: HB A1C (BAYER DCA - WAIVED): 6.2 % (ref <7.0)

## 2019-09-27 MED ORDER — TRAMADOL HCL 50 MG PO TABS: 100.0000 mg | ORAL_TABLET | Freq: Two times a day (BID) | ORAL | 2 refills | Status: DC | PRN

## 2019-09-27 NOTE — Progress Notes (Signed)
Subjective:    Patient ID: Ernest Martinez, male    DOB: [birth year], 64 y.o.   MRN: [id number]  Chief Complaint  Patient presents with  . pain management   PT presents to the office today for chronic follow up and pain medication refill. He is followed by Endocrinologists for hyperthyroidism. Hypertension This is a chronic problem. The current episode started more than 1 year ago. The problem has been waxing and waning since onset. The problem is controlled. Pertinent negatives include no malaise/fatigue, peripheral edema or shortness of breath. Risk factors for coronary artery disease include dyslipidemia, diabetes mellitus, obesity, male gender and sedentary lifestyle. The current treatment provides moderate improvement. There is no history of CVA.  Gastroesophageal Reflux He complains of belching and heartburn. He reports no dysphagia. This is a chronic problem. The current episode started more than 1 year ago. The problem occurs rarely. The problem has been rapidly improving. Risk factors include obesity. He has tried a PPI for the symptoms. The treatment provided moderate relief.  Arthritis Presents for follow-up visit. He complains of pain and joint swelling. The symptoms have been worsening. Affected locations include the right knee and left knee. His pain is at a severity of 10/10.  Hyperlipidemia This is a chronic problem. The current episode started more than 1 year ago. Exacerbating diseases include obesity. Pertinent negatives include no shortness of breath. Current antihyperlipidemic treatment includes statins. The current treatment provides moderate improvement of lipids.  Diabetes He presents for his follow-up diabetic visit. He has type 2 diabetes mellitus. His disease course has been stable. There are no hypoglycemic associated symptoms. Symptoms are stable. Pertinent negatives for diabetic complications include no CVA, heart disease, nephropathy or peripheral neuropathy. Risk  factors for coronary artery disease include dyslipidemia, diabetes mellitus, male sex, hypertension, sedentary lifestyle and post-menopausal. (Does not check BS at home)      Review of Systems  Constitutional: Negative for malaise/fatigue.  Respiratory: Negative for shortness of breath.   Gastrointestinal: Positive for heartburn. Negative for dysphagia.  Musculoskeletal: Positive for arthritis and joint swelling.  All other systems reviewed and are negative.      Objective:   Physical Exam Vitals reviewed.  Constitutional:      General: He is not in acute distress.    Appearance: He is well-developed. He is obese.  HENT:     Head: Normocephalic.     Right Ear: Tympanic membrane normal.     Left Ear: Tympanic membrane normal.  Eyes:     General:        Right eye: No discharge.        Left eye: No discharge.     Pupils: Pupils are equal, round, and reactive to light.  Neck:     Thyroid: No thyromegaly.  Cardiovascular:     Rate and Rhythm: Normal rate and regular rhythm.     Heart sounds: Normal heart sounds. No murmur.  Pulmonary:     Effort: Pulmonary effort is normal. No respiratory distress.     Breath sounds: Normal breath sounds. No wheezing.  Abdominal:     General: Bowel sounds are normal. There is no distension.     Palpations: Abdomen is soft.     Tenderness: There is no abdominal tenderness.  Musculoskeletal:        General: No tenderness. Normal range of motion.     Cervical back: Normal range of motion and neck supple.  Skin:    General: Skin is warm and  dry.     Findings: No erythema or rash.  Neurological:     Mental Status: He is alert and oriented to person, place, and time.     Cranial Nerves: No cranial nerve deficit.     Deep Tendon Reflexes: Reflexes are normal and symmetric.  Psychiatric:        Behavior: Behavior normal.        Thought Content: Thought content normal.        Judgment: Judgment normal.       BP 139/83   Pulse 78   Temp  (!) 97.1 F (36.2 C) (Temporal)   Ht _0  (1.651 m)   Wt (!) 346 lb (156.9 kg)   SpO2 96%   BMI 57.58 kg/m      Assessment & Plan:  Ernest Martinez comes in today with chief complaint of pain management   Diagnosis and orders addressed:  1. Hypertension associated with diabetes ([place]) - CMP14+EGFR - CBC with Differential/Platelet  2. Gastroesophageal reflux disease, unspecified whether esophagitis present - CMP14+EGFR - CBC with Differential/Platelet  3. Type 2 diabetes mellitus with hyperglycemia, without long-term current use of insulin ([facility]) - hgba1c - CMP14+EGFR - CBC with Differential/Platelet - Microalbumin / creatinine urine ratio  4. Hyperlipidemia associated with type 2 diabetes mellitus ([facility]) - CMP14+EGFR - CBC with Differential/Platelet  5. Arthritis - CMP14+EGFR - CBC with Differential/Platelet - traMADol (ULTRAM) 50 MG tablet; Take 2 tablets (100 mg total) by mouth every 12 (twelve) hours as needed.  Dispense: 120 tablet; Refill: 2  6. Morbid obesity ([place]) - CMP14+EGFR - CBC with Differential/Platelet  7. Pain management contract signed - CMP14+EGFR - CBC with Differential/Platelet - traMADol (ULTRAM) 50 MG tablet; Take 2 tablets (100 mg total) by mouth every 12 (twelve) hours as needed.  Dispense: 120 tablet; Refill: 2  8. Uncomplicated opioid dependence ([facility]) - CMP14+EGFR - CBC with Differential/Platelet - traMADol (ULTRAM) 50 MG tablet; Take 2 tablets (100 mg total) by mouth every 12 (twelve) hours as needed.  Dispense: 120 tablet; Refill: 2   Labs pending Health Maintenance reviewed Diet and exercise encouraged  Follow up plan: 3 months    Evelina Dun, FNP

## 2019-09-27 NOTE — Addendum Note (Signed)
Addended by: Liliane Bade on: 09/27/2019 09:13 AM   Modules accepted: Orders

## 2019-09-27 NOTE — Patient Instructions (Signed)

## 2019-09-28 LAB — T4, FREE: Free T4: 1.25 ng/dL (ref 0.82–1.77)

## 2019-09-28 LAB — CBC WITH DIFFERENTIAL/PLATELET
Basophils Absolute: 0 10*3/uL (ref 0.0–0.2)
Basos: 0 %
EOS (ABSOLUTE): 0.3 10*3/uL (ref 0.0–0.4)
Eos: 3 %
Hematocrit: 43.3 % (ref 37.5–51.0)
Hemoglobin: 14.7 g/dL (ref 13.0–17.7)
Immature Grans (Abs): 0 10*3/uL (ref 0.0–0.1)
Immature Granulocytes: 0 %
Lymphocytes Absolute: 1.7 10*3/uL (ref 0.7–3.1)
Lymphs: 21 %
MCH: 29.2 pg (ref 26.6–33.0)
MCHC: 33.9 g/dL (ref 31.5–35.7)
MCV: 86 fL (ref 79–97)
Monocytes Absolute: 0.5 10*3/uL (ref 0.1–0.9)
Monocytes: 6 %
Neutrophils Absolute: 5.7 10*3/uL (ref 1.4–7.0)
Neutrophils: 70 %
Platelets: 240 10*3/uL (ref 150–450)
RBC: 5.04 x10E6/uL (ref 4.14–5.80)
RDW: 12 % (ref 11.6–15.4)
WBC: 8.3 10*3/uL (ref 3.4–10.8)

## 2019-09-28 LAB — CMP14+EGFR
ALT: 12 IU/L (ref 0–44)
AST: 12 IU/L (ref 0–40)
Albumin/Globulin Ratio: 1.5 (ref 1.2–2.2)
Albumin: 4 g/dL (ref 3.8–4.8)
Alkaline Phosphatase: 67 IU/L (ref 39–117)
BUN/Creatinine Ratio: 13 (ref 10–24)
BUN: 12 mg/dL (ref 8–27)
Bilirubin Total: 0.4 mg/dL (ref 0.0–1.2)
CO2: 24 mmol/L (ref 20–29)
Calcium: 9.9 mg/dL (ref 8.6–10.2)
Chloride: 101 mmol/L (ref 96–106)
Creatinine, Ser: 0.96 mg/dL (ref 0.76–1.27)
GFR calc Af Amer: 97 mL/min/{1.73_m2} (ref >59)
GFR calc non Af Amer: 84 mL/min/{1.73_m2} (ref >59)
Globulin, Total: 2.6 g/dL (ref 1.5–4.5)
Glucose: 117 mg/dL — ABNORMAL HIGH (ref 65–99)
Potassium: 3.9 mmol/L (ref 3.5–5.2)
Sodium: 140 mmol/L (ref 134–144)
Total Protein: 6.6 g/dL (ref 6.0–8.5)

## 2019-09-28 LAB — MICROALBUMIN / CREATININE URINE RATIO
Creatinine, Urine: 17.9 mg/dL
Microalb/Creat Ratio: <17 mg/g creat (ref 0–29)
Microalbumin, Urine: <3 ug/mL

## 2019-09-28 LAB — TSH: TSH: 0.513 u[IU]/mL (ref 0.450–4.500)

## 2019-10-01 ENCOUNTER — Encounter: Payer: Self-pay | Admitting: *Deleted

## 2019-10-04 ENCOUNTER — Ambulatory Visit (INDEPENDENT_AMBULATORY_CARE_PROVIDER_SITE_OTHER): Payer: Medicare Other | Admitting: "Endocrinology

## 2019-10-04 ENCOUNTER — Encounter: Payer: Self-pay | Admitting: "Endocrinology

## 2019-10-04 DIAGNOSIS — E0510 Thyrotoxicosis with toxic single thyroid nodule without thyrotoxic crisis or storm: Secondary | ICD-10-CM | POA: Diagnosis not present

## 2019-10-04 NOTE — Progress Notes (Signed)
10/04/2019, 5:17 PM                                 Endocrinology Telehealth Visit Follow up Note -During COVID -19 Pandemic  I connected with Ernest Martinez on 10/04/2019   by telephone and verified that I am speaking with the correct person using two identifiers. Ernest Martinez, 1955-10-05. he has verbally consented to this visit. All issues noted in this document were discussed and addressed. The format was not optimal for physical exam.  Subjective:    Patient ID: Ernest Martinez, male    DOB: [birth year], PCP Sharion Balloon, FNP   Past Medical History:  Diagnosis Date  . Abnormal EKG   . Bacteremia due to Gram-negative bacteria    a. 03/2013: klebsiella UTI with bacteremia.  . Bladder filling defect    a. 03/2013: Filling defect at base of bladder by CT pelvis, for f/u urology.  . Borderline diabetes   . Diabetes mellitus without complication ([place])   . Dislocation of right shoulder joint sept 2014   after fall  . GERD (gastroesophageal reflux disease)   . Hydrocele of testis    a. 03/2013: US showing Large right testicular hydrocele and small left hydrocele.  Marland Kitchen Hypertension   . Morbid obesity ([place])    Past Surgical History:  Procedure Laterality Date  . HYDROCELE EXCISION Right 08/16/2013   Procedure: HYDROCELECTOMY ADULT  Procedure: Right Scrotal Hydrocele Repair;  Surgeon: Bernestine Amass, MD;  Location: [facility];  Service: Urology;  Laterality: Right;  . None     Social History   Socioeconomic History  . Marital status: Married    Spouse name: [name]  . Number of children: 2  . Years of education: 52  . Highest education level: High school graduate  Occupational History  . Occupation: Merchandiser, retail: SELF    Comment: on disability  Tobacco Use  . Smoking status: Never Smoker  . Smokeless tobacco: Never Used  Substance and Sexual Activity  . Alcohol use: Yes    Comment: socially-maybe 6 beers a month  .  Drug use: No  . Sexual activity: Not Currently  Other Topics Concern  . Not on file  Social History Narrative  . Not on file   Social Determinants of Health   Financial Resource Strain: Low Risk   . Difficulty of Paying Living Expenses: Not hard at all  Food Insecurity: No Food Insecurity  . Worried About Charity fundraiser in the Last Year: Never true  . Ran Out of Food in the Last Year: Never true  Transportation Needs: No Transportation Needs  . Lack of Transportation (Medical): No  . Lack of Transportation (Non-Medical): No  Physical Activity: Insufficiently Active  . Days of Exercise per Week: 7 days  . Minutes of Exercise per Session: 10 min  Stress: No Stress Concern Present  . Feeling of Stress : Not at all  Social Connections: Slightly Isolated  . Frequency of Communication with Friends and Family: More than three times a week  . Frequency of Social Gatherings with Friends and Family: More than three  times a week  . Attends Religious Services: More than 4 times per year  . Active Member of Clubs or Organizations: No  . Attends Archivist Meetings: Never  . Marital Status: Married   Family History  Problem Relation Age of Onset  . Cancer Father        prostate cancer  . CAD Neg Hx    Outpatient Encounter Medications as of 10/04/2019  Medication Sig  . amLODipine (NORVASC) 10 MG tablet TAKE ONE TABLET EVERY MORNING  . aspirin EC 81 MG tablet Take 1 tablet (81 mg total) by mouth daily.  . celecoxib (CELEBREX) 200 MG capsule TAKE ONE (1) CAPSULE EACH DAY  . furosemide (LASIX) 20 MG tablet TAKE ONE TABLET EVERY MORNING  . lisinopril-hydrochlorothiazide (ZESTORETIC) 20-12.5 MG tablet TAKE ONE (1) TABLET EACH DAY  . metFORMIN (GLUCOPHAGE) 1000 MG tablet TAKE ONE TABLET TWICE A DAY WITH FOOD  . omeprazole (PRILOSEC) 20 MG capsule TAKE ONE CAPSULE EACH MORNING  . rOPINIRole (REQUIP) 1 MG tablet TAKE ONE TABLET DAILY AT BEDTIME  . traMADol (ULTRAM) 50 MG tablet  Take 2 tablets (100 mg total) by mouth every 12 (twelve) hours as needed.   No facility-administered encounter medications on file as of 10/04/2019.   ALLERGIES: Allergies  Allergen Reactions  . Lovastatin     Headache and burning in stomach.  . Metoprolol Tartrate     headache  . Statins     VACCINATION STATUS:  There is no immunization history on file for this patient.  HPI Ernest Martinez is 64 y.o. male who presents today with a medical history as above. he is being engaged in telehealth via telephone after he was seen in consultation for suppressed TSH requested by Sharion Balloon, FNP.  -He is status post RAI thyroid ablation for toxic nodule on August 18, 2019.  He is not currently on any antithyroid intervention nor thyroid hormone supplement at this time.  He has no new complaints today. 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  He subsequently underwent fine-needle aspiration of this nodule with benign findings.    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     He denies palpitations, tremors, heat or cold intolerance.  Review of Systems  Limited as above  Objective:    There were no vitals taken for this visit.  Wt Readings from Last 3 Encounters:  09/27/19 (!) 346 lb (156.9 kg)  06/21/19 (!) 341 lb 9.6 oz (154.9 kg)  03/23/19 (!) 343 lb (155.6 kg)    Physical Exam  CMP     Component Value Date/Time   NA 140 09/27/2019 0846   K 3.9 09/27/2019 0846   CL 101 09/27/2019 0846   CO2 24 09/27/2019 0846   GLUCOSE 117 (H) 09/27/2019 0846   GLUCOSE 112 (H) 08/10/2013 1405   BUN 12 09/27/2019 0846   CREATININE 0.96 09/27/2019 0846   CALCIUM 9.9 09/27/2019 0846   PROT 6.6 09/27/2019 0846   ALBUMIN 4.0 09/27/2019 0846   AST 12 09/27/2019 0846   ALT 12 09/27/2019  0846   ALKPHOS 67 09/27/2019 0846   BILITOT 0.4 09/27/2019 0846   GFRNONAA 84 09/27/2019 0846   GFRAA 97 09/27/2019 0846     Diabetic Labs (most recent):  Lab Results  Component Value Date   HGBA1C 6.2 09/27/2019   HGBA1C 5.9 06/21/2019   HGBA1C 5.9 12/21/2018     Lipid Panel ( most recent) Lipid Panel     Component Value Date/Time   CHOL 171 06/21/2019 0918   TRIG 80 06/21/2019 0918   HDL 55 06/21/2019 0918   CHOLHDL 3.1 06/21/2019 0918   LDLCALC 101 (H) 06/21/2019 0918      Lab Results  Component Value Date   TSH 0.513 09/27/2019   TSH 0.21 (L) 03/08/2019   TSH 0.161 (L) 12/21/2018   TSH 0.065 (L) 06/18/2018   FREET4 1.25 09/27/2019   FREET4 1.2 03/08/2019      Assessment & Plan:   1.  Toxic nodule, history of hyperthyroidism.  -He is status post RAI therapy on August 13, 2019.  His most recent thyroid function tests on September 26, 2018 are consistent with treatment effect.   He is not hypothyroid, hence, will not require thyroid hormone supplements at this time. He will need repeat thyroid function test in 3 months with office visit.  He understands the possibility of subsequent hypothyroidism which would require thyroid hormone replacement.   - I advised him  to maintain close follow up with Sharion Balloon, FNP for primary care needs.     - Time spent on this patient care encounter:  25 minutes of which 50% was spent in  counseling and the rest reviewing  his current and  previous labs / studies and medications  doses and developing a plan for long term care. Ernest Martinez  participated in the discussions, expressed understanding, and voiced agreement with the above plans.  All questions were answered to his satisfaction. he is encouraged to contact clinic should he have any questions or concerns prior to his return visit.   Follow up plan: Return in about 3 months (around 01/02/2020) for Follow up with Pre-visit Labs.   Glade Lloyd, MD [facility] [address] [place], [place] [address] Phone: [phone]  Fax: [phone]     10/04/2019, 5:17 PM  This note was partially dictated with voice recognition software. Similar sounding words can be transcribed inadequately or may not  be corrected upon review.

## 2019-10-05 ENCOUNTER — Telehealth: Payer: Self-pay | Admitting: "Endocrinology

## 2019-10-05 DIAGNOSIS — E0590 Thyrotoxicosis, unspecified without thyrotoxic crisis or storm: Secondary | ICD-10-CM

## 2019-10-05 NOTE — Telephone Encounter (Signed)
Can you change his lab order to [facility]. Thanks

## 2019-10-06 ENCOUNTER — Other Ambulatory Visit: Payer: Self-pay

## 2019-10-06 ENCOUNTER — Ambulatory Visit: Payer: Medicare Other | Admitting: "Endocrinology

## 2019-10-06 NOTE — Addendum Note (Signed)
Addended by: Ellin Saba on: 10/06/2019 03:30 PM   Modules accepted: Orders

## 2019-10-06 NOTE — Telephone Encounter (Signed)
Sent lab orders to [facility]

## 2019-10-11 ENCOUNTER — Other Ambulatory Visit: Payer: Self-pay

## 2019-10-12 ENCOUNTER — Encounter: Payer: Self-pay | Admitting: Family Medicine

## 2019-10-12 ENCOUNTER — Ambulatory Visit (INDEPENDENT_AMBULATORY_CARE_PROVIDER_SITE_OTHER): Payer: Medicare Other | Admitting: Family Medicine

## 2019-10-12 VITALS — BP 125/72 | HR 81 | Temp 96.4°F | Ht 65.0 in | Wt 343.2 lb

## 2019-10-12 DIAGNOSIS — Z024 Encounter for examination for driving license: Secondary | ICD-10-CM | POA: Diagnosis not present

## 2019-10-12 LAB — URINALYSIS
Bilirubin, UA: NEGATIVE
Glucose, UA: NEGATIVE
Ketones, UA: NEGATIVE
Leukocytes,UA: NEGATIVE
Nitrite, UA: NEGATIVE
Protein,UA: NEGATIVE
RBC, UA: NEGATIVE
Specific Gravity, UA: 1.015 (ref 1.005–1.030)
Urobilinogen, Ur: 0.2 mg/dL (ref 0.2–1.0)
pH, UA: 5.5 (ref 5.0–7.5)

## 2019-10-12 NOTE — Progress Notes (Signed)
Subjective:  Patient ID: CHRISTERPHER KISHI, male    DOB: 15-Aug-1956  Age: 64 y.o. MRN: [id number]  CC: Commercial Driver's License Exam   HPI ALLEN HAASCH presents for DOT exam  Depression screen [facility] 2/9 09/27/2019 07/09/2019 06/21/2019  Decreased Interest 0 0 0  Down, Depressed, Hopeless 0 0 0  PHQ - 2 Score 0 0 0    History Jerroll has a past medical history of Abnormal EKG, Bacteremia due to Gram-negative bacteria, Bladder filling defect, Borderline diabetes, Diabetes mellitus without complication ([place]), Dislocation of right shoulder joint (sept 2014), GERD (gastroesophageal reflux disease), Hydrocele of testis, Hypertension, and Morbid obesity ([place]).   He has a past surgical history that includes None and Hydrocele surgery (Right, 08/16/2013).   His family history includes Cancer in his father.He reports that he has never smoked. He has never used smokeless tobacco. He reports current alcohol use. He reports that he does not use drugs.    ROS Review of Systems  Constitutional: Negative.   HENT: Negative.   Eyes: Negative for visual disturbance.  Respiratory: Negative for cough and shortness of breath.   Cardiovascular: Negative for chest pain and leg swelling.  Gastrointestinal: Negative for abdominal pain, diarrhea, nausea and vomiting.  Genitourinary: Negative for difficulty urinating.  Musculoskeletal: Negative for arthralgias and myalgias.  Skin: Negative for rash.  Neurological: Negative for headaches.  Psychiatric/Behavioral: Negative for sleep disturbance.    Objective:  BP 125/72   Pulse 81   Temp (!) 96.4 F (35.8 C) (Temporal)   Ht 5\' 5"  (1.651 m)   Wt (!) 343 lb 3.2 oz (155.7 kg)   BMI 57.11 kg/m   BP Readings from Last 3 Encounters:  10/12/19 125/72  09/27/19 139/83  06/21/19 130/81    Wt Readings from Last 3 Encounters:  10/12/19 (!) 343 lb 3.2 oz (155.7 kg)  09/27/19 (!) 346 lb (156.9 kg)  06/21/19 (!) 341 lb 9.6 oz (154.9 kg)     Physical  Exam Constitutional:      General: He is not in acute distress.    Appearance: He is well-developed.  HENT:     Head: Normocephalic and atraumatic.     Right Ear: External ear normal.     Left Ear: External ear normal.     Nose: Nose normal.  Eyes:     Conjunctiva/sclera: Conjunctivae normal.     Pupils: Pupils are equal, round, and reactive to light.  Cardiovascular:     Rate and Rhythm: Normal rate and regular rhythm.     Heart sounds: Normal heart sounds. No murmur.  Pulmonary:     Effort: Pulmonary effort is normal. No respiratory distress.     Breath sounds: Normal breath sounds. No wheezing or rales.  Abdominal:     Palpations: Abdomen is soft.     Tenderness: There is no abdominal tenderness.  Musculoskeletal:        General: Normal range of motion.     Cervical back: Normal range of motion and neck supple.  Skin:    General: Skin is warm and dry.  Neurological:     Mental Status: He is alert and oriented to person, place, and time.     Deep Tendon Reflexes: Reflexes are normal and symmetric.  Psychiatric:        Behavior: Behavior normal.        Thought Content: Thought content normal.        Judgment: Judgment normal.       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  He is limited by the presence of hypertension.  This however appears to be under good control.  He also has limited by the presence of diabetes mellitus.  Recent A1c during checkup by his primary care provider Ms. Evelina Dun is in range for passing his DOT.   I am having Jaxxon B. Carpio maintain his aspirin EC, metFORMIN, omeprazole, celecoxib, amLODipine, lisinopril-hydrochlorothiazide, furosemide, rOPINIRole, and traMADol.  Allergies as of  10/12/2019      Reactions   Lovastatin    Headache and burning in stomach.   Metoprolol Tartrate    headache   Statins       Medication List       Accurate as of October 12, 2019  8:09 PM. If you have any questions, ask your nurse or doctor.        amLODipine 10 MG tablet Commonly known as: NORVASC TAKE ONE TABLET EVERY MORNING   aspirin EC 81 MG tablet Take 1 tablet (81 mg total) by mouth daily.   celecoxib 200 MG capsule Commonly known as: CELEBREX TAKE ONE (1) CAPSULE EACH DAY   furosemide 20 MG tablet Commonly known as: LASIX TAKE ONE TABLET EVERY MORNING   lisinopril-hydrochlorothiazide 20-12.5 MG tablet Commonly known as: ZESTORETIC TAKE ONE (1) TABLET EACH DAY   metFORMIN 1000 MG tablet Commonly known as: GLUCOPHAGE TAKE ONE TABLET TWICE A DAY WITH FOOD   omeprazole 20 MG capsule Commonly known as: PRILOSEC TAKE ONE CAPSULE EACH MORNING   rOPINIRole 1 MG tablet Commonly known as: REQUIP TAKE ONE TABLET DAILY AT BEDTIME   traMADol 50 MG tablet Commonly known as: ULTRAM Take 2 tablets (100 mg total) by mouth every 12 (twelve) hours as needed.        Follow-up: Return in about 1 year (around 10/11/2020).  Claretta Fraise, M.D.

## 2019-11-01 ENCOUNTER — Other Ambulatory Visit: Payer: Self-pay | Admitting: Family

## 2019-11-01 DIAGNOSIS — I10 Essential (primary) hypertension: Secondary | ICD-10-CM

## 2019-11-15 ENCOUNTER — Other Ambulatory Visit: Payer: Self-pay | Admitting: Family

## 2019-11-15 DIAGNOSIS — G2581 Restless legs syndrome: Secondary | ICD-10-CM

## 2019-12-10 ENCOUNTER — Other Ambulatory Visit: Payer: Self-pay | Admitting: Family

## 2019-12-10 DIAGNOSIS — M1990 Unspecified osteoarthritis, unspecified site: Secondary | ICD-10-CM

## 2019-12-10 DIAGNOSIS — Z0289 Encounter for other administrative examinations: Secondary | ICD-10-CM

## 2019-12-10 DIAGNOSIS — K219 Gastro-esophageal reflux disease without esophagitis: Secondary | ICD-10-CM

## 2019-12-10 DIAGNOSIS — F1120 Opioid dependence, uncomplicated: Secondary | ICD-10-CM

## 2020-01-03 ENCOUNTER — Ambulatory Visit: Payer: Medicare Other | Admitting: "Endocrinology

## 2020-01-03 ENCOUNTER — Encounter: Payer: Self-pay | Admitting: Family

## 2020-01-03 ENCOUNTER — Other Ambulatory Visit: Payer: Self-pay

## 2020-01-03 ENCOUNTER — Ambulatory Visit (INDEPENDENT_AMBULATORY_CARE_PROVIDER_SITE_OTHER): Payer: Medicare Other | Admitting: Family

## 2020-01-03 VITALS — BP 102/60 | HR 76 | Temp 97.5°F | Ht 65.0 in | Wt 339.8 lb

## 2020-01-03 DIAGNOSIS — K219 Gastro-esophageal reflux disease without esophagitis: Secondary | ICD-10-CM

## 2020-01-03 DIAGNOSIS — Z0289 Encounter for other administrative examinations: Secondary | ICD-10-CM | POA: Diagnosis not present

## 2020-01-03 DIAGNOSIS — Z23 Encounter for immunization: Secondary | ICD-10-CM | POA: Diagnosis not present

## 2020-01-03 DIAGNOSIS — E785 Hyperlipidemia, unspecified: Secondary | ICD-10-CM

## 2020-01-03 DIAGNOSIS — M1990 Unspecified osteoarthritis, unspecified site: Secondary | ICD-10-CM | POA: Diagnosis not present

## 2020-01-03 DIAGNOSIS — E1169 Type 2 diabetes mellitus with other specified complication: Secondary | ICD-10-CM

## 2020-01-03 DIAGNOSIS — E1159 Type 2 diabetes mellitus with other circulatory complications: Secondary | ICD-10-CM | POA: Diagnosis not present

## 2020-01-03 DIAGNOSIS — F1120 Opioid dependence, uncomplicated: Secondary | ICD-10-CM

## 2020-01-03 DIAGNOSIS — E1165 Type 2 diabetes mellitus with hyperglycemia: Secondary | ICD-10-CM

## 2020-01-03 DIAGNOSIS — E0590 Thyrotoxicosis, unspecified without thyrotoxic crisis or storm: Secondary | ICD-10-CM | POA: Diagnosis not present

## 2020-01-03 DIAGNOSIS — G2581 Restless legs syndrome: Secondary | ICD-10-CM

## 2020-01-03 DIAGNOSIS — I10 Essential (primary) hypertension: Secondary | ICD-10-CM

## 2020-01-03 DIAGNOSIS — Z79891 Long term (current) use of opiate analgesic: Secondary | ICD-10-CM | POA: Diagnosis not present

## 2020-01-03 LAB — CMP14+EGFR
ALT: 13 IU/L (ref 0–44)
AST: 13 IU/L (ref 0–40)
Albumin/Globulin Ratio: 1.4 (ref 1.2–2.2)
Albumin: 3.9 g/dL (ref 3.8–4.8)
Alkaline Phosphatase: 61 IU/L (ref 39–117)
BUN/Creatinine Ratio: 17 (ref 10–24)
BUN: 23 mg/dL (ref 8–27)
Bilirubin Total: 0.4 mg/dL (ref 0.0–1.2)
CO2: 25 mmol/L (ref 20–29)
Calcium: 9.4 mg/dL (ref 8.6–10.2)
Chloride: 103 mmol/L (ref 96–106)
Creatinine, Ser: 1.34 mg/dL — ABNORMAL HIGH (ref 0.76–1.27)
GFR calc Af Amer: 65 mL/min/{1.73_m2} (ref >59)
GFR calc non Af Amer: 56 mL/min/{1.73_m2} — ABNORMAL LOW (ref >59)
Globulin, Total: 2.8 g/dL (ref 1.5–4.5)
Glucose: 156 mg/dL — ABNORMAL HIGH (ref 65–99)
Potassium: 3.7 mmol/L (ref 3.5–5.2)
Sodium: 148 mmol/L — ABNORMAL HIGH (ref 134–144)
Total Protein: 6.7 g/dL (ref 6.0–8.5)

## 2020-01-03 LAB — CBC WITH DIFFERENTIAL/PLATELET
Basophils Absolute: 0 10*3/uL (ref 0.0–0.2)
Basos: 0 %
EOS (ABSOLUTE): 0.2 10*3/uL (ref 0.0–0.4)
Eos: 2 %
Hematocrit: 42.2 % (ref 37.5–51.0)
Hemoglobin: 14 g/dL (ref 13.0–17.7)
Immature Grans (Abs): 0 10*3/uL (ref 0.0–0.1)
Immature Granulocytes: 0 %
Lymphocytes Absolute: 1.5 10*3/uL (ref 0.7–3.1)
Lymphs: 17 %
MCH: 29.2 pg (ref 26.6–33.0)
MCHC: 33.2 g/dL (ref 31.5–35.7)
MCV: 88 fL (ref 79–97)
Monocytes Absolute: 0.5 10*3/uL (ref 0.1–0.9)
Monocytes: 6 %
Neutrophils Absolute: 6.3 10*3/uL (ref 1.4–7.0)
Neutrophils: 75 %
Platelets: 256 10*3/uL (ref 150–450)
RBC: 4.79 x10E6/uL (ref 4.14–5.80)
RDW: 12.3 % (ref 11.6–15.4)
WBC: 8.5 10*3/uL (ref 3.4–10.8)

## 2020-01-03 LAB — BAYER DCA HB A1C WAIVED: HB A1C (BAYER DCA - WAIVED): 6.7 % (ref <7.0)

## 2020-01-03 MED ORDER — METFORMIN HCL 1000 MG PO TABS: 1000.0000 mg | ORAL_TABLET | Freq: Two times a day (BID) | ORAL | 3 refills | Status: AC

## 2020-01-03 MED ORDER — OMEPRAZOLE 20 MG PO CPDR: 20.0000 mg | DELAYED_RELEASE_CAPSULE | Freq: Every day | ORAL | 3 refills | Status: AC

## 2020-01-03 MED ORDER — LISINOPRIL-HYDROCHLOROTHIAZIDE 20-12.5 MG PO TABS: 1.0000 | ORAL_TABLET | Freq: Every day | ORAL | 3 refills | Status: AC

## 2020-01-03 MED ORDER — TRAMADOL HCL 50 MG PO TABS: 100.0000 mg | ORAL_TABLET | Freq: Two times a day (BID) | ORAL | 2 refills | Status: DC | PRN

## 2020-01-03 MED ORDER — ROPINIROLE HCL 1 MG PO TABS: 1.0000 mg | ORAL_TABLET | Freq: Every day | ORAL | 3 refills | Status: DC

## 2020-01-03 MED ORDER — FUROSEMIDE 20 MG PO TABS: 20.0000 mg | ORAL_TABLET | Freq: Every morning | ORAL | 3 refills | Status: AC

## 2020-01-03 MED ORDER — CELECOXIB 200 MG PO CAPS: 200.0000 mg | ORAL_CAPSULE | Freq: Every day | ORAL | 3 refills | Status: AC

## 2020-01-03 MED ORDER — AMLODIPINE BESYLATE 10 MG PO TABS: 10.0000 mg | ORAL_TABLET | Freq: Every morning | ORAL | 3 refills | Status: AC

## 2020-01-03 NOTE — Progress Notes (Signed)
Subjective:    Patient ID: Ernest Martinez, male    DOB: [birth year], 64 y.o.   MRN: [id number]  Chief Complaint  Patient presents with  . Medical Management of Chronic Issues   PT presents to the office today for chronic follow up and pain medication refill. He is followed by Endocrinologists for hyperthyroidism. Hypertension This is a chronic problem. The current episode started more than 1 year ago. The problem has been resolved since onset. The problem is controlled. Pertinent negatives include no chest pain, malaise/fatigue, peripheral edema or shortness of breath. Risk factors for coronary artery disease include obesity, male gender, sedentary lifestyle, dyslipidemia and diabetes mellitus. Hypertensive end-organ damage includes heart failure. Identifiable causes of hypertension include a thyroid problem.  Gastroesophageal Reflux He complains of belching and heartburn. He reports no chest pain. This is a chronic problem. The current episode started more than 1 year ago. The problem occurs rarely. The problem has been resolved. Risk factors include obesity. He has tried a PPI for the symptoms. The treatment provided significant relief.  Arthritis Presents for follow-up visit. He complains of pain and stiffness. The symptoms have been stable. Affected locations include the right knee and left knee. His pain is at a severity of 9/10.      Review of Systems  Constitutional: Negative for malaise/fatigue.  Respiratory: Negative for shortness of breath.   Cardiovascular: Negative for chest pain.  Gastrointestinal: Positive for heartburn.  Musculoskeletal: Positive for arthritis and stiffness.  All other systems reviewed and are negative.      Objective:   Physical Exam Vitals reviewed.  Constitutional:      General: He is not in acute distress.    Appearance: He is well-developed. He is obese.  HENT:     Head: Normocephalic.     Right Ear: Tympanic membrane normal.     Left Ear:  Tympanic membrane normal.  Eyes:     General:        Right eye: No discharge.        Left eye: No discharge.     Pupils: Pupils are equal, round, and reactive to light.  Neck:     Thyroid: No thyromegaly.  Cardiovascular:     Rate and Rhythm: Normal rate and regular rhythm.     Heart sounds: Normal heart sounds. No murmur.  Pulmonary:     Effort: Pulmonary effort is normal. No respiratory distress.     Breath sounds: Normal breath sounds. No wheezing.  Abdominal:     General: Bowel sounds are normal. There is no distension.     Palpations: Abdomen is soft.     Tenderness: There is no abdominal tenderness.  Musculoskeletal:        General: No tenderness.     Cervical back: Normal range of motion and neck supple.     Comments: Pain in bilateral knees with flexion and extension   Skin:    General: Skin is warm and dry.     Findings: No erythema or rash.  Neurological:     Mental Status: He is alert and oriented to person, place, and time.     Cranial Nerves: No cranial nerve deficit.     Deep Tendon Reflexes: Reflexes are normal and symmetric.  Psychiatric:        Behavior: Behavior normal.        Thought Content: Thought content normal.        Judgment: Judgment normal.       BP  102/60   Pulse 76   Temp (!) 97.5 F (36.4 C) (Temporal)   Ht _0  (1.651 m)   Wt (!) 339 lb 12.8 oz (154.1 kg)   SpO2 95%   BMI 56.55 kg/m      Assessment & Plan:  Ernest Martinez comes in today with chief complaint of Medical Management of Chronic Issues   Diagnosis and orders addressed:  1. Arthritis Pt reviewed in [place] controlled database, no red flags noted  Contract and drug up dated  - traMADol (ULTRAM) 50 MG tablet; Take 2 tablets (100 mg total) by mouth every 12 (twelve) hours as needed.  Dispense: 120 tablet; Refill: 2 - celecoxib (CELEBREX) 200 MG capsule; Take 1 capsule (200 mg total) by mouth daily.  Dispense: 90 capsule; Refill: 3 - CMP14+EGFR - CBC with  Differential/Platelet  2. Pain management contract signed - traMADol (ULTRAM) 50 MG tablet; Take 2 tablets (100 mg total) by mouth every 12 (twelve) hours as needed.  Dispense: 120 tablet; Refill: 2 - CMP14+EGFR - CBC with Differential/Platelet  3. Uncomplicated opioid dependence (HCC) - traMADol (ULTRAM) 50 MG tablet; Take 2 tablets (100 mg total) by mouth every 12 (twelve) hours as needed.  Dispense: 120 tablet; Refill: 2 - CMP14+EGFR - CBC with Differential/Platelet  4. Essential hypertension - amLODipine (NORVASC) 10 MG tablet; Take 1 tablet (10 mg total) by mouth every morning.  Dispense: 90 tablet; Refill: 3 - lisinopril-hydrochlorothiazide (ZESTORETIC) 20-12.5 MG tablet; Take 1 tablet by mouth daily.  Dispense: 90 tablet; Refill: 3 - CMP14+EGFR - CBC with Differential/Platelet  5. Gastroesophageal reflux disease - omeprazole (PRILOSEC) 20 MG capsule; Take 1 capsule (20 mg total) by mouth daily.  Dispense: 90 capsule; Refill: 3 - CMP14+EGFR - CBC with Differential/Platelet  6. RLS (restless legs syndrome) - rOPINIRole (REQUIP) 1 MG tablet; Take 1 tablet (1 mg total) by mouth at bedtime.  Dispense: 90 tablet; Refill: 3 - CMP14+EGFR - CBC with Differential/Platelet  7. Hypertension associated with diabetes ([place]) - CMP14+EGFR - CBC with Differential/Platelet  8. Gastroesophageal reflux disease, unspecified whether esophagitis present - CMP14+EGFR - CBC with Differential/Platelet  9. Type 2 diabetes mellitus with hyperglycemia, without long-term current use of insulin (HCC) - Bayer DCA Hb A1c Waived - CMP14+EGFR - CBC with Differential/Platelet  10. Hyperlipidemia associated with type 2 diabetes mellitus (HCC) - CMP14+EGFR - CBC with Differential/Platelet  11. Morbid obesity ([place]) - CMP14+EGFR - CBC with Differential/Platelet   Labs pending Health Maintenance reviewed Diet and exercise encouraged  Follow up plan: 3 months    Evelina Dun, FNP

## 2020-01-03 NOTE — Patient Instructions (Signed)

## 2020-01-03 NOTE — Addendum Note (Signed)
Addended by: Evelina Dun A on: 01/03/2020 08:40 AM   Modules accepted: Orders

## 2020-01-04 LAB — T3, FREE: T3, Free: 3.1 pg/mL (ref 2.0–4.4)

## 2020-01-04 LAB — TSH: TSH: 2.75 u[IU]/mL (ref 0.450–4.500)

## 2020-01-04 LAB — T4, FREE: Free T4: 1.32 ng/dL (ref 0.82–1.77)

## 2020-01-05 LAB — TOXASSURE SELECT 13 (MW), URINE

## 2020-01-07 ENCOUNTER — Telehealth: Payer: Self-pay | Admitting: Family

## 2020-01-07 NOTE — Chronic Care Management (AMB) (Signed)
  Chronic Care Management   Outreach Note  01/07/2020 Name: Ernest Martinez MRN: [id number] DOB: March 07, 1956  Ernest Martinez is a 64 y.o. year old male who is a primary care patient of Sharion Balloon, FNP. I reached out to [name] by phone today in response to a referral sent by Ernest Martinez health plan.     An unsuccessful telephone outreach was attempted today. The patient was referred to the case management team for assistance with care management and care coordination.   Follow Up Plan: A HIPPA compliant phone message was left for the patient providing contact information and requesting a return call.  The care management team will reach out to the patient again over the next 7 days.  If patient returns call to provider office, please advise to call [facility]  at [place], [place], [place], [place] [address] Direct Dial: [phone] [email or web address]: [email or web address]

## 2020-01-10 ENCOUNTER — Encounter: Payer: Self-pay | Admitting: "Endocrinology

## 2020-01-10 ENCOUNTER — Other Ambulatory Visit: Payer: Self-pay

## 2020-01-10 ENCOUNTER — Ambulatory Visit (INDEPENDENT_AMBULATORY_CARE_PROVIDER_SITE_OTHER): Payer: Medicare Other | Admitting: "Endocrinology

## 2020-01-10 VITALS — BP 139/81 | HR 62 | Ht 65.5 in | Wt 353.8 lb

## 2020-01-10 DIAGNOSIS — E0510 Thyrotoxicosis with toxic single thyroid nodule without thyrotoxic crisis or storm: Secondary | ICD-10-CM | POA: Diagnosis not present

## 2020-01-10 NOTE — Chronic Care Management (AMB) (Signed)
  Chronic Care Management   Outreach Note  01/10/2020 Name: GUSTAVO HEENEY MRN: [id number] DOB: 08-23-1956  HEIKO STAUDT is a 64 y.o. year old male who is a primary care patient of Sharion Balloon, FNP. I reached out to [name] by phone today in response to a referral sent by Mr. CARVIS MANKIEWICZ health plan.     A second unsuccessful telephone outreach was attempted today. The patient was referred to the case management team for assistance with care management and care coordination.   Follow Up Plan: A HIPPA compliant phone message was left for the patient providing contact information and requesting a return call.  The care management team will reach out to the patient again over the next 7 days.  If patient returns call to provider office, please advise to call [facility]  at [place], [place], [place], [place] [address] Direct Dial: [phone] [email or web address] Website: [email or web address]

## 2020-01-10 NOTE — Progress Notes (Signed)
01/10/2020, 8:35 AM   Endocrinology follow-up note   Subjective:    Patient ID: Ernest Martinez, male    DOB: [birth year], PCP Sharion Balloon, FNP   Past Medical History:  Diagnosis Date  . Abnormal EKG   . Bacteremia due to Gram-negative bacteria    a. 03/2013: klebsiella UTI with bacteremia.  . Bladder filling defect    a. 03/2013: Filling defect at base of bladder by CT pelvis, for f/u urology.  . Borderline diabetes   . Diabetes mellitus without complication ([place])   . Dislocation of right shoulder joint sept 2014   after fall  . GERD (gastroesophageal reflux disease)   . Hydrocele of testis    a. 03/2013: US showing Large right testicular hydrocele and small left hydrocele.  Marland Kitchen Hypertension   . Morbid obesity ([place])    Past Surgical History:  Procedure Laterality Date  . HYDROCELE EXCISION Right 08/16/2013   Procedure: HYDROCELECTOMY ADULT  Procedure: Right Scrotal Hydrocele Repair;  Surgeon: Bernestine Amass, MD;  Location: [facility];  Service: Urology;  Laterality: Right;  . None     Social History   Socioeconomic History  . Marital status: Married    Spouse name: [name]  . Number of children: 2  . Years of education: 46  . Highest education level: High school graduate  Occupational History  . Occupation: Merchandiser, retail: SELF    Comment: on disability  Tobacco Use  . Smoking status: Never Smoker  . Smokeless tobacco: Never Used  Substance and Sexual Activity  . Alcohol use: Yes    Comment: socially-maybe 6 beers a month  . Drug use: No  . Sexual activity: Not Currently  Other Topics Concern  . Not on file  Social History Narrative  . Not on file   Social Determinants of Health   Financial Resource Strain: Low Risk   . Difficulty of Paying Living Expenses: Not hard at all  Food Insecurity: No Food Insecurity  . Worried About Charity fundraiser in the Last Year: Never true  . Ran Out of Food  in the Last Year: Never true  Transportation Needs: No Transportation Needs  . Lack of Transportation (Medical): No  . Lack of Transportation (Non-Medical): No  Physical Activity: Insufficiently Active  . Days of Exercise per Week: 7 days  . Minutes of Exercise per Session: 10 min  Stress: No Stress Concern Present  . Feeling of Stress : Not at all  Social Connections: Slightly Isolated  . Frequency of Communication with Friends and Family: More than three times a week  . Frequency of Social Gatherings with Friends and Family: More than three times a week  . Attends Religious Services: More than 4 times per year  . Active Member of Clubs or Organizations: No  . Attends Archivist Meetings: Never  . Marital Status: Married   Family History  Problem Relation Age of Onset  . Cancer Father        prostate cancer  . CAD Neg Hx    Outpatient Encounter Medications as of 01/10/2020  Medication Sig  . amLODipine (NORVASC) 10 MG tablet Take 1 tablet (10 mg total) by mouth  every morning.  Marland Kitchen aspirin EC 81 MG tablet Take 1 tablet (81 mg total) by mouth daily.  . celecoxib (CELEBREX) 200 MG capsule Take 1 capsule (200 mg total) by mouth daily.  . furosemide (LASIX) 20 MG tablet Take 1 tablet (20 mg total) by mouth every morning.  Marland Kitchen lisinopril-hydrochlorothiazide (ZESTORETIC) 20-12.5 MG tablet Take 1 tablet by mouth daily.  . metFORMIN (GLUCOPHAGE) 1000 MG tablet Take 1 tablet (1,000 mg total) by mouth 2 (two) times daily with a meal.  . omeprazole (PRILOSEC) 20 MG capsule Take 1 capsule (20 mg total) by mouth daily.  Marland Kitchen rOPINIRole (REQUIP) 1 MG tablet Take 1 tablet (1 mg total) by mouth at bedtime.  . traMADol (ULTRAM) 50 MG tablet Take 2 tablets (100 mg total) by mouth every 12 (twelve) hours as needed.   No facility-administered encounter medications on file as of 01/10/2020.   ALLERGIES: Allergies  Allergen Reactions  . Lovastatin     Headache and burning in stomach.  .  Metoprolol Tartrate     headache  . Statins     VACCINATION STATUS:  There is no immunization history on file for this patient.  HPI Ernest Martinez is 64 y.o. male who presents today with a medical history as above. 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  He received I-131 thyroid ablation on August 18, 2019. PCP: Sharion Balloon, FNP.    He is not currently on any antithyroid intervention nor thyroid hormone supplement at this time.    His previsit labs are consistent with treatment effect, no hypothyroidism yet. 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  He subsequently underwent fine-needle aspiration of this nodule with benign findings.    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  He has no new complaints today.  He has regained most of the weight he lost during thyrotoxicosis.  Denies palpitations, tremors, heat/cold intolerance.   Review of Systems  Limited as above  Objective:    BP 139/81   Pulse 62   Ht 5' 5.5" (1.664 m)   Wt (!) 353 lb 12.8 oz (160.5 kg)   BMI 57.98 kg/m   Wt Readings from Last 3 Encounters:  01/10/20 (!) 353 lb 12.8 oz (160.5 kg)  01/03/20 (!) 339 lb 12.8 oz (154.1 kg)  10/12/19 (!) 343 lb 3.2 oz (155.7 kg)    Physical Exam   Physical Exam- Limited  Constitutional:  Body mass index is 57.98 kg/m. , not in acute distress, normal state of mind Eyes:  EOMI, no exophthalmos Neck: Supple Thyroid: No gross goiter Respiratory: Adequate breathing efforts Musculoskeletal: no gross deformities, strength intact in all four extremities, no gross restriction of joint movements Skin:  no rashes, no hyperemia Neurological: no tremor with outstretched hands,   CMP     Component Value  Date/Time   NA 148 (H) 01/03/2020 0841   K 3.7 01/03/2020 0841   CL 103 01/03/2020 0841   CO2 25 01/03/2020 0841   GLUCOSE 156 (H) 01/03/2020 0841   GLUCOSE 112 (H) 08/10/2013 1405   BUN 23 01/03/2020 0841   CREATININE 1.34 (H) 01/03/2020 0841   CALCIUM 9.4 01/03/2020 0841   PROT 6.7 01/03/2020 0841  ALBUMIN 3.9 01/03/2020 0841   AST 13 01/03/2020 0841   ALT 13 01/03/2020 0841   ALKPHOS 61 01/03/2020 0841   BILITOT 0.4 01/03/2020 0841   GFRNONAA 56 (L) 01/03/2020 0841   GFRAA 65 01/03/2020 0841     Diabetic Labs (most recent): Lab Results  Component Value Date   HGBA1C 6.7 01/03/2020   HGBA1C 6.2 09/27/2019   HGBA1C 5.9 06/21/2019     Lipid Panel ( most recent) Lipid Panel     Component Value Date/Time   CHOL 171 06/21/2019 0918   TRIG 80 06/21/2019 0918   HDL 55 06/21/2019 0918   CHOLHDL 3.1 06/21/2019 0918   LDLCALC 101 (H) 06/21/2019 0918      Lab Results  Component Value Date   TSH 2.750 01/03/2020   TSH 0.513 09/27/2019   TSH 0.21 (L) 03/08/2019   TSH 0.161 (L) 12/21/2018   TSH 0.065 (L) 06/18/2018   FREET4 1.32 01/03/2020   FREET4 1.25 09/27/2019   FREET4 1.2 03/08/2019      Assessment & Plan:   1.  Toxic nodule, history of hyperthyroidism.  -He is status post RAI therapy on August 13, 2019.  His most recent thyroid function tests on September 26, 2018 are consistent with treatment effect.   He is not hypothyroid, hence, will not require thyroid hormone supplements at this time. He will need repeat thyroid function test in 6 months with office visit.  He understands the possibility of subsequent hypothyroidism which will require thyroid hormone replacement.   - I advised him  to maintain close follow up with Sharion Balloon, FNP for primary care needs.     - Time spent on this patient care encounter:  25 minutes of which 50% was spent in  counseling and the rest reviewing  his current and  previous labs / studies and medications  doses and  developing a plan for long term care. Ernest Martinez  participated in the discussions, expressed understanding, and voiced agreement with the above plans.  All questions were answered to his satisfaction. he is encouraged to contact clinic should he have any questions or concerns prior to his return visit.   Follow up plan: Return in about 6 months (around 07/11/2020) for Follow up with Pre-visit Labs.   Glade Lloyd, MD [facility] [address] [place], [place] [address] Phone: [phone]  Fax: [phone]     01/10/2020, 8:35 AM  This note was partially dictated with voice recognition software. Similar sounding words can be transcribed inadequately or may not  be corrected upon review.

## 2020-01-11 ENCOUNTER — Other Ambulatory Visit: Payer: Self-pay | Admitting: Family

## 2020-01-11 ENCOUNTER — Other Ambulatory Visit: Payer: Self-pay | Admitting: *Deleted

## 2020-01-11 DIAGNOSIS — R7989 Other specified abnormal findings of blood chemistry: Secondary | ICD-10-CM

## 2020-01-12 NOTE — Chronic Care Management (AMB) (Signed)
  Chronic Care Management   Outreach Note  01/12/2020 Name: TAKUTO STREIGHT MRN: [id number] DOB: March 07, 1956  BUNNIE GILLITZER is a 64 y.o. year old male who is a primary care patient of Sharion Balloon, FNP. I reached out to [name] by phone today in response to a referral sent by Mr. DYLLON FREIERMUTH health plan.     Third unsuccessful telephone outreach was attempted today. The patient was referred to the case management team for assistance with care management and care coordination. The patient's primary care provider has been notified of our unsuccessful attempts to make or maintain contact with the patient. The care management team is pleased to engage with this patient at any time in the future should he/she be interested in assistance from the care management team.   Follow Up Plan: The care management team is available to follow up with the patient after provider conversation with the patient regarding recommendation for care management engagement and subsequent re-referral to the care management team.   Noreene Larsson, [place], [place], [place] [address] Direct Dial: [phone] [email or web address]: [email or web address]

## 2020-01-20 ENCOUNTER — Other Ambulatory Visit: Payer: Self-pay | Admitting: Family

## 2020-01-20 NOTE — Telephone Encounter (Signed)
Please review and advise.

## 2020-01-20 NOTE — Telephone Encounter (Signed)
Patients wife states its all sperate. States it will be cheaper with insurance

## 2020-01-20 NOTE — Telephone Encounter (Signed)
°  Prescription Request  01/20/2020  What is the name of the medication or equipment? Lidocaine - Prilocaine cream, and Saclofen 5% Diclofenac Gabapentine for his knees. Had appt with Christy 4-12   Have you contacted your pharmacy to request a refill? (if applicable) NO  Which pharmacy would you like [facility] sent [facility]   Patient notified that their request is being sent to the clinical staff for review and that they should receive a response within 2 business days.

## 2020-01-20 NOTE — Telephone Encounter (Signed)
Is this all one cream or does he want a separate prescriptions?

## 2020-01-24 ENCOUNTER — Telehealth: Payer: Self-pay | Admitting: Family Medicine

## 2020-01-24 MED ORDER — DICLOFENAC SODIUM 1 % EX GEL: 2.0000 g | Freq: Four times a day (QID) | CUTANEOUS | 2 refills | Status: DC

## 2020-01-24 MED ORDER — LIDOCAINE-PRILOCAINE 2.5-2.5 % EX CREA: 1.0000 "application " | TOPICAL_CREAM | CUTANEOUS | 2 refills | Status: DC | PRN

## 2020-01-24 NOTE — Addendum Note (Signed)
Addended by: Evelina Dun A on: 01/24/2020 04:35 PM   Modules accepted: Orders

## 2020-01-24 NOTE — Telephone Encounter (Signed)
Emla and Voltaren Prescription sent to pharmacy

## 2020-01-24 NOTE — Telephone Encounter (Signed)
Spoke with Aaron Edelman at the drug store. They can not compound this cream there. He suggested to Emla Cream and Voltaren gel. Spoke with patient and told her she would have to get that special compound cream from another pharmacy or could try what Aaron Edelman suggested first and that is what she would like to do the Emla and Voltaren. Please advise.

## 2020-01-31 DIAGNOSIS — Z23 Encounter for immunization: Secondary | ICD-10-CM | POA: Diagnosis not present

## 2020-03-09 ENCOUNTER — Other Ambulatory Visit: Payer: Self-pay | Admitting: Family

## 2020-03-09 DIAGNOSIS — M1990 Unspecified osteoarthritis, unspecified site: Secondary | ICD-10-CM

## 2020-03-09 DIAGNOSIS — Z0289 Encounter for other administrative examinations: Secondary | ICD-10-CM

## 2020-03-09 DIAGNOSIS — F1120 Opioid dependence, uncomplicated: Secondary | ICD-10-CM

## 2020-04-04 ENCOUNTER — Ambulatory Visit (INDEPENDENT_AMBULATORY_CARE_PROVIDER_SITE_OTHER): Payer: Medicare Other | Admitting: Family

## 2020-04-04 ENCOUNTER — Other Ambulatory Visit: Payer: Self-pay

## 2020-04-04 ENCOUNTER — Ambulatory Visit (INDEPENDENT_AMBULATORY_CARE_PROVIDER_SITE_OTHER): Payer: Medicare Other

## 2020-04-04 ENCOUNTER — Encounter: Payer: Self-pay | Admitting: Family

## 2020-04-04 VITALS — BP 128/82 | HR 66 | Temp 98.6°F | Ht 65.5 in | Wt 342.0 lb

## 2020-04-04 DIAGNOSIS — Z0289 Encounter for other administrative examinations: Secondary | ICD-10-CM

## 2020-04-04 DIAGNOSIS — M25811 Other specified joint disorders, right shoulder: Secondary | ICD-10-CM

## 2020-04-04 DIAGNOSIS — E1159 Type 2 diabetes mellitus with other circulatory complications: Secondary | ICD-10-CM

## 2020-04-04 DIAGNOSIS — E1165 Type 2 diabetes mellitus with hyperglycemia: Secondary | ICD-10-CM | POA: Diagnosis not present

## 2020-04-04 DIAGNOSIS — K219 Gastro-esophageal reflux disease without esophagitis: Secondary | ICD-10-CM

## 2020-04-04 DIAGNOSIS — E1169 Type 2 diabetes mellitus with other specified complication: Secondary | ICD-10-CM

## 2020-04-04 DIAGNOSIS — I152 Hypertension secondary to endocrine disorders: Secondary | ICD-10-CM

## 2020-04-04 DIAGNOSIS — F1120 Opioid dependence, uncomplicated: Secondary | ICD-10-CM

## 2020-04-04 DIAGNOSIS — E785 Hyperlipidemia, unspecified: Secondary | ICD-10-CM

## 2020-04-04 DIAGNOSIS — M19011 Primary osteoarthritis, right shoulder: Secondary | ICD-10-CM | POA: Diagnosis not present

## 2020-04-04 DIAGNOSIS — M1990 Unspecified osteoarthritis, unspecified site: Secondary | ICD-10-CM | POA: Diagnosis not present

## 2020-04-04 DIAGNOSIS — I10 Essential (primary) hypertension: Secondary | ICD-10-CM

## 2020-04-04 LAB — CMP14+EGFR
ALT: 12 IU/L (ref 0–44)
AST: 13 IU/L (ref 0–40)
Albumin/Globulin Ratio: 1.4 (ref 1.2–2.2)
Albumin: 3.7 g/dL — ABNORMAL LOW (ref 3.8–4.8)
Alkaline Phosphatase: 68 IU/L (ref 48–121)
BUN/Creatinine Ratio: 13 (ref 10–24)
BUN: 13 mg/dL (ref 8–27)
Bilirubin Total: 0.3 mg/dL (ref 0.0–1.2)
CO2: 26 mmol/L (ref 20–29)
Calcium: 9.1 mg/dL (ref 8.6–10.2)
Chloride: 101 mmol/L (ref 96–106)
Creatinine, Ser: 1.04 mg/dL (ref 0.76–1.27)
GFR calc Af Amer: 88 mL/min/{1.73_m2} (ref >59)
GFR calc non Af Amer: 76 mL/min/{1.73_m2} (ref >59)
Globulin, Total: 2.7 g/dL (ref 1.5–4.5)
Glucose: 120 mg/dL — ABNORMAL HIGH (ref 65–99)
Potassium: 3.9 mmol/L (ref 3.5–5.2)
Sodium: 141 mmol/L (ref 134–144)
Total Protein: 6.4 g/dL (ref 6.0–8.5)

## 2020-04-04 LAB — BAYER DCA HB A1C WAIVED: HB A1C (BAYER DCA - WAIVED): 6.3 % (ref <7.0)

## 2020-04-04 MED ORDER — TRAMADOL HCL 50 MG PO TABS: 100.0000 mg | ORAL_TABLET | Freq: Two times a day (BID) | ORAL | 2 refills | Status: DC | PRN

## 2020-04-04 NOTE — Progress Notes (Signed)
Subjective:    Patient ID: Ernest Martinez, male    DOB: Apr 07, 1956, 64 y.o.   MRN: [id number]  Chief Complaint  Patient presents with  . Medical Management of Chronic Issues  . Diabetes  . Hypertension   PT presents to the office today for chronic follow up and pain medication refill. He is followed by Endocrinologists for hyperthyroidism. PT complaining of a "lump" on right shoulder that has been there for years, but he states it has become larger over the last few months.  Diabetes He presents for his follow-up diabetic visit. He has type 2 diabetes mellitus. His disease course has been stable. Pertinent negatives for diabetes include no blurred vision, no foot paresthesias and no visual change. Symptoms are stable. Pertinent negatives for diabetic complications include no CVA, heart disease or peripheral neuropathy. Risk factors for coronary artery disease include dyslipidemia, diabetes mellitus, male sex, hypertension and sedentary lifestyle. He is following a generally unhealthy diet. (Does not check BS at home ) An ACE inhibitor/angiotensin II receptor blocker is being taken. Eye exam is not current.  Hypertension This is a chronic problem. The current episode started more than 1 year ago. The problem has been resolved since onset. The problem is controlled. Pertinent negatives include no blurred vision, malaise/fatigue, peripheral edema or shortness of breath. Risk factors for coronary artery disease include dyslipidemia, diabetes mellitus, obesity and male gender. The current treatment provides moderate improvement. There is no history of CVA.  Arthritis Presents for follow-up visit. He complains of pain and joint swelling. The symptoms have been worsening. Affected locations include the left knee and right knee. His pain is at a severity of 10/10.  Gastroesophageal Reflux He complains of belching and heartburn. This is a chronic problem. The current episode started more than 1 year ago.  The problem occurs occasionally. The problem has been waxing and waning. Risk factors include obesity. He has tried a PPI for the symptoms. The treatment provided moderate relief.  RLS  PT taking Requip 1 mg. States this greatly helps with movement of his legs.     Review of Systems  Constitutional: Negative for malaise/fatigue.  Eyes: Negative for blurred vision.  Respiratory: Negative for shortness of breath.   Gastrointestinal: Positive for heartburn.  Musculoskeletal: Positive for arthritis and joint swelling.  All other systems reviewed and are negative.      Objective:   Physical Exam Vitals reviewed.  Constitutional:      General: He is not in acute distress.    Appearance: He is well-developed. He is obese.  HENT:     Head: Normocephalic.     Right Ear: Tympanic membrane normal.     Left Ear: Tympanic membrane normal.  Eyes:     General:        Right eye: No discharge.        Left eye: No discharge.     Pupils: Pupils are equal, round, and reactive to light.  Neck:     Thyroid: No thyromegaly.  Cardiovascular:     Rate and Rhythm: Normal rate and regular rhythm.     Heart sounds: Normal heart sounds. No murmur heard.   Pulmonary:     Effort: Pulmonary effort is normal. No respiratory distress.     Breath sounds: Normal breath sounds. No wheezing.  Abdominal:     General: Bowel sounds are normal. There is no distension.     Palpations: Abdomen is soft.     Tenderness: There is no  abdominal tenderness.  Musculoskeletal:        General: No tenderness.     Cervical back: Normal range of motion and neck supple.     Right lower leg: Edema (pain in extension ) present.     Left lower leg: Edema (pain with  extension ) present.     Comments: Moveable mass on right anterior shoulder  Skin:    General: Skin is warm and dry.     Findings: No erythema or rash.  Neurological:     Mental Status: He is alert and oriented to person, place, and time.     Cranial Nerves:  No cranial nerve deficit.     Deep Tendon Reflexes: Reflexes are normal and symmetric.  Psychiatric:        Behavior: Behavior normal.        Thought Content: Thought content normal.        Judgment: Judgment normal.      BP 128/82   Pulse 66   Temp 98.6 F (37 C)   Ht 5' 5.5" (1.664 m)   Wt (!) 342 lb (155.1 kg)   SpO2 95%   BMI 56.05 kg/m      Assessment & Plan:  Ernest Martinez comes in today with chief complaint of Medical Management of Chronic Issues, Diabetes, and Hypertension   Diagnosis and orders addressed:  1. Type 2 diabetes mellitus with hyperglycemia, without long-term current use of insulin (HCC) - Bayer DCA Hb A1c Waived - CMP14+EGFR  2. Arthritis - traMADol (ULTRAM) 50 MG tablet; Take 2 tablets (100 mg total) by mouth every 12 (twelve) hours as needed.  Dispense: 120 tablet; Refill: 2 - CMP14+EGFR  3. Pain management contract signed - traMADol (ULTRAM) 50 MG tablet; Take 2 tablets (100 mg total) by mouth every 12 (twelve) hours as needed.  Dispense: 120 tablet; Refill: 2 - CMP14+EGFR  4. Uncomplicated opioid dependence (HCC) - traMADol (ULTRAM) 50 MG tablet; Take 2 tablets (100 mg total) by mouth every 12 (twelve) hours as needed.  Dispense: 120 tablet; Refill: 2 - CMP14+EGFR  5. Hypertension associated with diabetes ([place]) - CMP14+EGFR  6. Gastroesophageal reflux disease, unspecified whether esophagitis present - CMP14+EGFR  7. Hyperlipidemia associated with type 2 diabetes mellitus (HCC) - CMP14+EGFR  8. Morbid obesity ([place]) - CMP14+EGFR  10. Mass of joint of right shoulder Will do X-ray, may need [place] - DG Shoulder Right; Future    Labs pending Health Maintenance reviewed Diet and exercise encouraged  Follow up plan: 3 months    Evelina Dun, FNP

## 2020-04-04 NOTE — Patient Instructions (Signed)

## 2020-04-05 ENCOUNTER — Ambulatory Visit: Payer: Medicare Other | Admitting: "Endocrinology

## 2020-04-06 ENCOUNTER — Other Ambulatory Visit: Payer: Self-pay | Admitting: Family

## 2020-04-06 DIAGNOSIS — R2231 Localized swelling, mass and lump, right upper limb: Secondary | ICD-10-CM

## 2020-04-10 ENCOUNTER — Other Ambulatory Visit: Payer: Self-pay

## 2020-04-10 DIAGNOSIS — R2231 Localized swelling, mass and lump, right upper limb: Secondary | ICD-10-CM

## 2020-04-10 NOTE — Progress Notes (Signed)
Order for [place] changed per advice from [facility] [place] dept

## 2020-04-19 ENCOUNTER — Ambulatory Visit (HOSPITAL_COMMUNITY)
Admission: RE | Admit: 2020-04-19 | Discharge: 2020-04-19 | Disposition: A | Discharge status: Home / Self Care | Payer: Medicare Other | Source: Ambulatory Visit | Attending: Family | Admitting: Family

## 2020-04-19 ENCOUNTER — Other Ambulatory Visit: Payer: Self-pay

## 2020-04-19 DIAGNOSIS — R2231 Localized swelling, mass and lump, right upper limb: Secondary | ICD-10-CM | POA: Diagnosis not present

## 2020-04-21 ENCOUNTER — Other Ambulatory Visit: Payer: Self-pay | Admitting: Family

## 2020-04-21 DIAGNOSIS — R2230 Localized swelling, mass and lump, unspecified upper limb: Secondary | ICD-10-CM

## 2020-05-12 ENCOUNTER — Ambulatory Visit (HOSPITAL_COMMUNITY)
Admission: RE | Admit: 2020-05-12 | Discharge: 2020-05-12 | Disposition: A | Discharge status: Home / Self Care | Payer: Medicare Other | Source: Ambulatory Visit | Attending: Family | Admitting: Family

## 2020-05-12 ENCOUNTER — Other Ambulatory Visit: Payer: Self-pay | Admitting: Family

## 2020-05-12 ENCOUNTER — Other Ambulatory Visit: Payer: Self-pay

## 2020-05-12 DIAGNOSIS — R2230 Localized swelling, mass and lump, unspecified upper limb: Secondary | ICD-10-CM | POA: Insufficient documentation

## 2020-05-12 DIAGNOSIS — D1779 Benign lipomatous neoplasm of other sites: Secondary | ICD-10-CM | POA: Diagnosis not present

## 2020-05-12 DIAGNOSIS — M19011 Primary osteoarthritis, right shoulder: Secondary | ICD-10-CM | POA: Diagnosis not present

## 2020-05-12 DIAGNOSIS — M6258 Muscle wasting and atrophy, not elsewhere classified, other site: Secondary | ICD-10-CM | POA: Diagnosis not present

## 2020-05-12 DIAGNOSIS — M75121 Complete rotator cuff tear or rupture of right shoulder, not specified as traumatic: Secondary | ICD-10-CM | POA: Diagnosis not present

## 2020-05-12 MED ORDER — GADOBUTROL 1 MMOL/ML IV SOLN
10.0000 mL | Freq: Once | INTRAVENOUS | Status: AC | PRN
  Administered 2020-05-12: 10 mL via INTRAVENOUS

## 2020-05-15 ENCOUNTER — Telehealth: Payer: Self-pay

## 2020-05-15 ENCOUNTER — Other Ambulatory Visit: Payer: Self-pay | Admitting: Family

## 2020-05-15 DIAGNOSIS — C499 Malignant neoplasm of connective and soft tissue, unspecified: Secondary | ICD-10-CM

## 2020-05-15 NOTE — Telephone Encounter (Signed)
[facility] called with abnormal MRI report - verified final report is in chart - please review and advise.

## 2020-05-17 ENCOUNTER — Telehealth: Payer: Self-pay | Admitting: Family

## 2020-06-01 ENCOUNTER — Other Ambulatory Visit: Payer: Self-pay | Admitting: Family

## 2020-06-01 DIAGNOSIS — Z0289 Encounter for other administrative examinations: Secondary | ICD-10-CM

## 2020-06-01 DIAGNOSIS — F1120 Opioid dependence, uncomplicated: Secondary | ICD-10-CM

## 2020-06-01 DIAGNOSIS — M1990 Unspecified osteoarthritis, unspecified site: Secondary | ICD-10-CM

## 2020-06-13 DIAGNOSIS — R2231 Localized swelling, mass and lump, right upper limb: Secondary | ICD-10-CM | POA: Diagnosis not present

## 2020-06-13 DIAGNOSIS — C4911 Malignant neoplasm of connective and soft tissue of right upper limb, including shoulder: Secondary | ICD-10-CM | POA: Diagnosis not present

## 2020-06-13 DIAGNOSIS — C7641 Malignant neoplasm of right upper limb: Secondary | ICD-10-CM | POA: Diagnosis not present

## 2020-06-13 DIAGNOSIS — R9389 Abnormal findings on diagnostic imaging of other specified body structures: Secondary | ICD-10-CM | POA: Diagnosis not present

## 2020-06-20 DIAGNOSIS — R918 Other nonspecific abnormal finding of lung field: Secondary | ICD-10-CM | POA: Diagnosis not present

## 2020-06-20 DIAGNOSIS — M7989 Other specified soft tissue disorders: Secondary | ICD-10-CM | POA: Diagnosis not present

## 2020-06-20 DIAGNOSIS — C4911 Malignant neoplasm of connective and soft tissue of right upper limb, including shoulder: Secondary | ICD-10-CM | POA: Diagnosis not present

## 2020-06-20 DIAGNOSIS — R9389 Abnormal findings on diagnostic imaging of other specified body structures: Secondary | ICD-10-CM | POA: Diagnosis not present

## 2020-06-26 ENCOUNTER — Telehealth: Payer: Self-pay

## 2020-06-26 ENCOUNTER — Encounter: Payer: Self-pay | Admitting: Family

## 2020-06-26 ENCOUNTER — Other Ambulatory Visit: Payer: Self-pay

## 2020-06-26 ENCOUNTER — Ambulatory Visit (INDEPENDENT_AMBULATORY_CARE_PROVIDER_SITE_OTHER): Payer: Medicare Other | Admitting: Family

## 2020-06-26 VITALS — BP 131/75 | HR 89 | Temp 98.0°F | Ht 65.5 in | Wt 342.0 lb

## 2020-06-26 DIAGNOSIS — Z0289 Encounter for other administrative examinations: Secondary | ICD-10-CM | POA: Diagnosis not present

## 2020-06-26 DIAGNOSIS — E1165 Type 2 diabetes mellitus with hyperglycemia: Secondary | ICD-10-CM

## 2020-06-26 DIAGNOSIS — F1120 Opioid dependence, uncomplicated: Secondary | ICD-10-CM | POA: Diagnosis not present

## 2020-06-26 DIAGNOSIS — K219 Gastro-esophageal reflux disease without esophagitis: Secondary | ICD-10-CM

## 2020-06-26 DIAGNOSIS — E785 Hyperlipidemia, unspecified: Secondary | ICD-10-CM

## 2020-06-26 DIAGNOSIS — M1990 Unspecified osteoarthritis, unspecified site: Secondary | ICD-10-CM

## 2020-06-26 DIAGNOSIS — E1159 Type 2 diabetes mellitus with other circulatory complications: Secondary | ICD-10-CM

## 2020-06-26 DIAGNOSIS — I152 Hypertension secondary to endocrine disorders: Secondary | ICD-10-CM

## 2020-06-26 DIAGNOSIS — G2581 Restless legs syndrome: Secondary | ICD-10-CM

## 2020-06-26 DIAGNOSIS — E1169 Type 2 diabetes mellitus with other specified complication: Secondary | ICD-10-CM

## 2020-06-26 LAB — BAYER DCA HB A1C WAIVED: HB A1C (BAYER DCA - WAIVED): 6.6 % (ref <7.0)

## 2020-06-26 MED ORDER — TRAMADOL HCL 50 MG PO TABS: 100.0000 mg | ORAL_TABLET | Freq: Two times a day (BID) | ORAL | 2 refills | Status: AC | PRN

## 2020-06-26 MED ORDER — ROPINIROLE HCL 1 MG PO TABS: 1.0000 mg | ORAL_TABLET | Freq: Every day | ORAL | 3 refills | Status: AC

## 2020-06-26 NOTE — Patient Instructions (Signed)

## 2020-06-26 NOTE — Progress Notes (Signed)
Subjective:    Patient ID: Ernest Martinez, male    DOB: 1956/02/21, 64 y.o.   MRN: [id number]  Chief Complaint  Patient presents with  . Medical Management of Chronic Issues    not fasting, right shoulder getting surgery this Friday   . Hypertension  . Diabetes   PT presents to the office today for chronic follow up and pain medication refill. He is followed by Endocrinologists for hyperthyroidism. PT has a mass on right shoulder and has surgery scheduled for 06/30/20 to have it removed.  Hypertension This is a chronic problem. The current episode started more than 1 year ago. The problem has been resolved since onset. The problem is controlled. Pertinent negatives include no blurred vision, malaise/fatigue, peripheral edema or shortness of breath. Risk factors for coronary artery disease include dyslipidemia, diabetes mellitus, obesity, male gender and sedentary lifestyle. The current treatment provides moderate improvement. There is no history of kidney disease, CAD/MI, CVA or heart failure.  Diabetes He presents for his follow-up diabetic visit. He has type 2 diabetes mellitus. His disease course has been stable. There are no hypoglycemic associated symptoms. Pertinent negatives for diabetes include no blurred vision and no foot paresthesias. There are no hypoglycemic complications. Symptoms are stable. Pertinent negatives for diabetic complications include no CVA, heart disease, nephropathy or peripheral neuropathy. Risk factors for coronary artery disease include obesity, male sex, hypertension, sedentary lifestyle, dyslipidemia and diabetes mellitus. He is following a generally unhealthy diet. (Does not check at home) An ACE inhibitor/angiotensin II receptor blocker is being taken. Eye exam is not current.  Gastroesophageal Reflux He complains of belching and heartburn. This is a chronic problem. The problem occurs rarely. The problem has been waxing and waning. He has tried a PPI for the  symptoms. The treatment provided moderate relief.  Hyperlipidemia This is a chronic problem. The current episode started more than 1 year ago. The problem is uncontrolled. Exacerbating diseases include obesity. Pertinent negatives include no shortness of breath. He is currently on no antihyperlipidemic treatment. The current treatment provides no improvement of lipids. Risk factors for coronary artery disease include dyslipidemia, diabetes mellitus, male sex, obesity, hypertension and a sedentary lifestyle.  Arthritis Presents for follow-up visit. He complains of pain and stiffness. The symptoms have been worsening. Affected locations include the right knee and left knee. His pain is at a severity of 10/10.   RLS PT currently taking Requip 1 mg at bedtime. States this has resolved now since started this medications.   Review of Systems  Constitutional: Negative for malaise/fatigue.  Eyes: Negative for blurred vision.  Respiratory: Negative for shortness of breath.   Gastrointestinal: Positive for heartburn.  Musculoskeletal: Positive for arthritis and stiffness.  All other systems reviewed and are negative.      Objective:   Physical Exam Constitutional:      Appearance: Normal appearance. He is obese.  HENT:     Right Ear: Tympanic membrane normal.     Left Ear: Tympanic membrane normal.     Nose: Nose normal.  Cardiovascular:     Rate and Rhythm: Normal rate and regular rhythm.     Pulses: Normal pulses.  Pulmonary:     Effort: Pulmonary effort is normal.     Breath sounds: Normal breath sounds.  Abdominal:     General: Bowel sounds are normal.     Palpations: Abdomen is soft.  Musculoskeletal:        General: Swelling and tenderness (pain in bilateral knees with  flexion and exention, ) present.     Cervical back: Normal range of motion and neck supple.  Skin:    General: Skin is warm and dry.     Comments: Mass on right shoulder approx 14X12 cm   Neurological:      General: No focal deficit present.     Mental Status: He is alert and oriented to person, place, and time.  Psychiatric:        Mood and Affect: Mood normal.        Behavior: Behavior normal.          BP 131/75   Pulse 89   Temp 98 F (36.7 C) (Temporal)   Ht 5' 5.5" (1.664 m)   Wt (!) 342 lb (155.1 kg)   SpO2 94%   BMI 56.05 kg/m   Assessment & Plan:  Ernest Martinez comes in today with chief complaint of Medical Management of Chronic Issues (not fasting, right shoulder getting surgery this Friday ), Hypertension, and Diabetes   Diagnosis and orders addressed:  1. Type 2 diabetes mellitus with hyperglycemia, without long-term current use of insulin (HCC) - Bayer DCA Hb A1c Waived  2. Arthritis - traMADol (ULTRAM) 50 MG tablet; Take 2 tablets (100 mg total) by mouth every 12 (twelve) hours as needed.  Dispense: 120 tablet; Refill: 2  3. Pain management contract signed - traMADol (ULTRAM) 50 MG tablet; Take 2 tablets (100 mg total) by mouth every 12 (twelve) hours as needed.  Dispense: 120 tablet; Refill: 2  4. Uncomplicated opioid dependence (HCC) - traMADol (ULTRAM) 50 MG tablet; Take 2 tablets (100 mg total) by mouth every 12 (twelve) hours as needed.  Dispense: 120 tablet; Refill: 2  5. Hypertension associated with diabetes ([place])  6. Gastroesophageal reflux disease, unspecified whether esophagitis present  7. Hyperlipidemia associated with type 2 diabetes mellitus ([place])  8. Morbid obesity ([place])  9. RLS (restless legs syndrome) - rOPINIRole (REQUIP) 1 MG tablet; Take 1 tablet (1 mg total) by mouth at bedtime.  Dispense: 90 tablet; Refill: 3   Labs pending Health Maintenance reviewed Diet and exercise encouraged  Follow up plan: 3 months    Evelina Dun, FNP

## 2020-06-26 NOTE — Telephone Encounter (Signed)
Added to patient's immunization list.

## 2020-06-27 LAB — CMP14+EGFR
ALT: 9 IU/L (ref 0–44)
AST: 13 IU/L (ref 0–40)
Albumin/Globulin Ratio: 1.3 (ref 1.2–2.2)
Albumin: 3.9 g/dL (ref 3.8–4.8)
Alkaline Phosphatase: 71 IU/L (ref 44–121)
BUN/Creatinine Ratio: 12 (ref 10–24)
BUN: 14 mg/dL (ref 8–27)
Bilirubin Total: 0.4 mg/dL (ref 0.0–1.2)
CO2: 24 mmol/L (ref 20–29)
Calcium: 9.5 mg/dL (ref 8.6–10.2)
Chloride: 102 mmol/L (ref 96–106)
Creatinine, Ser: 1.14 mg/dL (ref 0.76–1.27)
GFR calc Af Amer: 79 mL/min/{1.73_m2} (ref >59)
GFR calc non Af Amer: 68 mL/min/{1.73_m2} (ref >59)
Globulin, Total: 3 g/dL (ref 1.5–4.5)
Glucose: 84 mg/dL (ref 65–99)
Potassium: 3.8 mmol/L (ref 3.5–5.2)
Sodium: 140 mmol/L (ref 134–144)
Total Protein: 6.9 g/dL (ref 6.0–8.5)

## 2020-06-27 LAB — CBC WITH DIFFERENTIAL/PLATELET
Basophils Absolute: 0 10*3/uL (ref 0.0–0.2)
Basos: 0 %
EOS (ABSOLUTE): 0.2 10*3/uL (ref 0.0–0.4)
Eos: 3 %
Hematocrit: 41 % (ref 37.5–51.0)
Hemoglobin: 14 g/dL (ref 13.0–17.7)
Immature Grans (Abs): 0 10*3/uL (ref 0.0–0.1)
Immature Granulocytes: 0 %
Lymphocytes Absolute: 2 10*3/uL (ref 0.7–3.1)
Lymphs: 23 %
MCH: 29.9 pg (ref 26.6–33.0)
MCHC: 34.1 g/dL (ref 31.5–35.7)
MCV: 88 fL (ref 79–97)
Monocytes Absolute: 0.7 10*3/uL (ref 0.1–0.9)
Monocytes: 8 %
Neutrophils Absolute: 5.6 10*3/uL (ref 1.4–7.0)
Neutrophils: 66 %
Platelets: 296 10*3/uL (ref 150–450)
RBC: 4.68 x10E6/uL (ref 4.14–5.80)
RDW: 11.9 % (ref 11.6–15.4)
WBC: 8.5 10*3/uL (ref 3.4–10.8)

## 2020-06-27 LAB — LIPID PANEL
Chol/HDL Ratio: 3.2 ratio (ref 0.0–5.0)
Cholesterol, Total: 161 mg/dL (ref 100–199)
HDL: 50 mg/dL (ref >39)
LDL Chol Calc (NIH): 96 mg/dL (ref 0–99)
Triglycerides: 77 mg/dL (ref 0–149)
VLDL Cholesterol Cal: 15 mg/dL (ref 5–40)

## 2020-06-30 DIAGNOSIS — M7989 Other specified soft tissue disorders: Secondary | ICD-10-CM | POA: Diagnosis not present

## 2020-06-30 DIAGNOSIS — G8918 Other acute postprocedural pain: Secondary | ICD-10-CM | POA: Diagnosis not present

## 2020-06-30 DIAGNOSIS — D1721 Benign lipomatous neoplasm of skin and subcutaneous tissue of right arm: Secondary | ICD-10-CM | POA: Diagnosis not present

## 2020-06-30 DIAGNOSIS — C4911 Malignant neoplasm of connective and soft tissue of right upper limb, including shoulder: Secondary | ICD-10-CM | POA: Diagnosis not present

## 2020-06-30 DIAGNOSIS — E119 Type 2 diabetes mellitus without complications: Secondary | ICD-10-CM | POA: Diagnosis not present

## 2020-07-11 ENCOUNTER — Ambulatory Visit (INDEPENDENT_AMBULATORY_CARE_PROVIDER_SITE_OTHER): Payer: Medicare Other | Admitting: *Deleted

## 2020-07-11 DIAGNOSIS — Z0000 Encounter for general adult medical examination without abnormal findings: Secondary | ICD-10-CM | POA: Diagnosis not present

## 2020-07-11 DIAGNOSIS — E0510 Thyrotoxicosis with toxic single thyroid nodule without thyrotoxic crisis or storm: Secondary | ICD-10-CM

## 2020-07-11 DIAGNOSIS — E0590 Thyrotoxicosis, unspecified without thyrotoxic crisis or storm: Secondary | ICD-10-CM

## 2020-07-11 NOTE — Progress Notes (Addendum)
MEDICARE ANNUAL WELLNESS VISIT  07/11/2020   Telephone Visit Disclaimer This Medicare AWV was conducted by telephone due to national recommendations for restrictions regarding the COVID-19 Pandemic (e.g. social distancing).  I verified, using two identifiers, that I am speaking with Ernest Martinez or their authorized healthcare agent. I discussed the limitations, risks, security, and privacy concerns of performing an evaluation and management service by telephone and the potential availability of an in-person appointment in the future. The patient expressed understanding and agreed to proceed.   Location of Patient: Home Location of Provider (nurse): [facility]  Subjective:    Ernest Martinez is a 64 y.o. male patient of Hawks, Theador Hawthorne, FNP who had a Medicare Annual Wellness Visit today via telephone. Onis is disabled and lives with his wife [name]. He has one son. He reports that he is socially active and does interact with friends/family regularly. 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  Patient Care Team: Sharion Balloon, FNP as PCP - General (Family Medicine)  Advanced Directives 07/11/2020 07/09/2019 07/01/2018 03/06/2017 10/25/2014 08/10/2013 03/28/2013  Does Patient Have a Medical Advance Directive? No No No No No Patient does not have advance directive Patient does not have advance directive  Would patient like information on creating a medical advance directive? No - Patient declined No - Patient declined Yes (MAU/Ambulatory/Procedural Areas - Information given) - - - -  Pre-existing out of facility DNR order (yellow form or pink MOST form) - - - - - No -    Hospital Utilization Over the Past 12 Months: # of hospitalizations or ER visits: 0 # of surgeries: 1  Review of Systems    Patient reports that his overall health is unchanged compared to last year.  History obtained from the patient.  Patient Reported Readings (BP, Pulse, CBG,  Weight, etc) none  Pain Assessment Pain : 0-10 Pain Score: 6  Pain Type: Acute pain Pain Location: Shoulder Pain Orientation: Right Pain Descriptors / Indicators: Dull, Aching Pain Onset: In the past 7 days Pain Frequency: Intermittent Pain Relieving Factors: pain meds Effect of Pain on Daily Activities: post surgery, not active currently  Pain Relieving Factors: pain meds  Current Medications & Allergies (verified) Allergies as of 07/11/2020      Reactions   Lovastatin    Headache and burning in stomach.   Metoprolol Tartrate    headache   Statins       Medication List       Accurate as of July 11, 2020  8:52 AM. If you have any questions, ask your nurse or doctor.        amLODipine 10 MG tablet Commonly known as: NORVASC Take 1 tablet (10 mg total) by mouth every morning.   amoxicillin-clavulanate 875-125 MG tablet Commonly known as: AUGMENTIN Take by mouth.   aspirin EC 81 MG tablet Take 1 tablet (81 mg total) by mouth daily.   celecoxib 200 MG capsule Commonly known as: CELEBREX Take 1 capsule (200 mg total) by mouth daily.   diclofenac Sodium 1 % Gel Commonly known as: Voltaren Apply 2 g topically 4 (four) times daily.   furosemide 20 MG tablet Commonly known as: LASIX Take 1 tablet (20 mg total) by mouth every morning.   lidocaine-prilocaine cream Commonly known as: EMLA APPLY DAILY AS NEEDED   lisinopril-hydrochlorothiazide 20-12.5 MG tablet Commonly known as: ZESTORETIC Take 1 tablet by mouth daily.   metFORMIN 1000 MG tablet Commonly  known as: GLUCOPHAGE Take 1 tablet (1,000 mg total) by mouth 2 (two) times daily with a meal.   omeprazole 20 MG capsule Commonly known as: PRILOSEC Take 1 capsule (20 mg total) by mouth daily.   ondansetron 4 MG tablet Commonly known as: ZOFRAN Take 4 mg by mouth every 8 (eight) hours as needed.   oxyCODONE 5 MG immediate release tablet Commonly known as: Oxy IR/ROXICODONE Take by mouth.     rOPINIRole 1 MG tablet Commonly known as: REQUIP Take 1 tablet (1 mg total) by mouth at bedtime.   senna-docusate 8.6-50 MG tablet Commonly known as: Senokot-S Take by mouth.   traMADol 50 MG tablet Commonly known as: ULTRAM Take 2 tablets (100 mg total) by mouth every 12 (twelve) hours as needed.       History (reviewed): Past Medical History:  Diagnosis Date  . Abnormal EKG   . Bacteremia due to Gram-negative bacteria    a. 03/2013: klebsiella UTI with bacteremia.  . Bladder filling defect    a. 03/2013: Filling defect at base of bladder by CT pelvis, for f/u urology.  . Borderline diabetes   . Diabetes mellitus without complication ([place])   . Dislocation of right shoulder joint sept 2014   after fall  . GERD (gastroesophageal reflux disease)   . Hydrocele of testis    a. 03/2013: US showing Large right testicular hydrocele and small left hydrocele.  Marland Kitchen Hypertension   . Morbid obesity ([place])    Past Surgical History:  Procedure Laterality Date  . HYDROCELE EXCISION Right 08/16/2013   Procedure: HYDROCELECTOMY ADULT  Procedure: Right Scrotal Hydrocele Repair;  Surgeon: Bernestine Amass, MD;  Location: [facility];  Service: Urology;  Laterality: Right;  . None    . tumor removed right shoulder     Family History  Problem Relation Age of Onset  . Cancer Father        prostate cancer  . CAD Neg Hx    Social History   Socioeconomic History  . Marital status: Married    Spouse name: [name]  . Number of children: 2  . Years of education: 24  . Highest education level: High school graduate  Occupational History  . Occupation: Merchandiser, retail: SELF    Comment: on disability  Tobacco Use  . Smoking status: Never Smoker  . Smokeless tobacco: Never Used  Vaping Use  . Vaping Use: Never used  Substance and Sexual Activity  . Alcohol use: Yes    Comment: socially-maybe 6 beers a month  . Drug use: No  . Sexual activity: Not Currently  Other Topics Concern  .  Not on file  Social History Narrative  . Not on file   Social Determinants of Health   Financial Resource Strain:   . Difficulty of Paying Living Expenses: Not on file  Food Insecurity:   . Worried About Charity fundraiser in the Last Year: Not on file  . Ran Out of Food in the Last Year: Not on file  Transportation Needs:   . Lack of Transportation (Medical): Not on file  . Lack of Transportation (Non-Medical): Not on file  Physical Activity:   . Days of Exercise per Week: Not on file  . Minutes of Exercise per Session: Not on file  Stress:   . Feeling of Stress : Not on file  Social Connections:   . Frequency of Communication with Friends and Family: Not on file  . Frequency of Social  Gatherings with Friends and Family: Not on file  . Attends Religious Services: Not on file  . Active Member of Clubs or Organizations: Not on file  . Attends Archivist Meetings: Not on file  . Marital Status: Not on file    Activities of Daily Living In your present state of health, do you have any difficulty performing the following activities: 07/11/2020  Hearing? N  Vision? N  Difficulty concentrating or making decisions? N  Walking or climbing stairs? Y  Comment sometimes due to bad knees  Dressing or bathing? N  Doing errands, shopping? N  Preparing Food and eating ? N  Using the Toilet? N  In the past six months, have you accidently leaked urine? N  Do you have problems with loss of bowel control? N  Managing your Medications? N  Managing your Finances? N  Housekeeping or managing your Housekeeping? N  Some recent data might be hidden    Patient Education/ Literacy How often do you need to have someone help you when you read instructions, pamphlets, or other written materials from your doctor or pharmacy?: 1 - Never What is the last grade level you completed in school?: 12  Exercise Current Exercise Habits: The patient does not participate in regular exercise at  present, Exercise limited by: orthopedic condition(s) (bad knees)  Diet Patient reports consuming 3 meals a day and 2 snack(s) a day Patient reports that his primary diet is: Regular Patient reports that she does not have regular access to food.   Depression Screen PHQ 2/9 Scores 07/11/2020 06/26/2020 04/04/2020 01/03/2020 09/27/2019 07/09/2019 06/21/2019  PHQ - 2 Score 0 0 0 0 0 0 0     Fall Risk Fall Risk  07/11/2020 06/26/2020 04/04/2020 01/03/2020 09/27/2019  Falls in the past year? 0 0 0 0 0  Number falls in past yr: - - - - -  Injury with Fall? - - - - -  Risk for fall due to : - - - - -  Risk for fall due to: Comment - - - - -  Follow up - - - - -  Comment - - - - -     Objective:  Ernest Martinez seemed alert and oriented and he participated appropriately during our telephone visit.  Blood Pressure Weight BMI  BP Readings from Last 3 Encounters:  06/26/20 131/75  04/04/20 128/82  01/10/20 139/81   Wt Readings from Last 3 Encounters:  06/26/20 (!) 342 lb (155.1 kg)  04/04/20 (!) 342 lb (155.1 kg)  01/10/20 (!) 353 lb 12.8 oz (160.5 kg)   BMI Readings from Last 1 Encounters:  06/26/20 56.05 kg/m    *Unable to obtain current vital signs, weight, and BMI due to telephone visit type  Hearing/Vision  . Elizabeth did not seem to have difficulty with hearing/understanding during the telephone conversation . Reports that he has not had a formal eye exam by an eye care professional within the past year . Reports that he has not had a formal hearing evaluation within the past year *Unable to fully assess hearing and vision during telephone visit type  Cognitive Function: 6CIT Screen 07/11/2020 07/09/2019  What Year? 0 points 0 points  What month? 0 points 0 points  What time? 0 points 0 points  Count back from 20 0 points 0 points  Months in reverse 0 points 0 points  Repeat phrase 0 points 0 points  Total Score 0 0   (Normal:0-7, Significant for Dysfunction: >  8)  Normal  Cognitive Function Screening: Yes   Immunization & Health Maintenance Record Immunization History  Administered Date(s) Administered  . Moderna SARS-COVID-2 Vaccination 01/03/2020, 01/31/2020    Health Maintenance  Topic Date Due  . OPHTHALMOLOGY EXAM  10/05/2020 (Originally 10/01/2017)  . INFLUENZA VACCINE  12/21/2020 (Originally 04/23/2020)  . COLON CANCER SCREENING ANNUAL FOBT  12/25/2020 (Originally 07/28/2006)  . COLONOSCOPY  06/26/2021 (Originally 07/28/2006)  . PNEUMOCOCCAL POLYSACCHARIDE VACCINE AGE 33-64 HIGH RISK  06/26/2021 (Originally 07/28/1958)  . FOOT EXAM  09/26/2020  . HEMOGLOBIN A1C  12/25/2020  . TETANUS/TDAP  07/12/2025  . COVID-19 Vaccine  Completed  . Hepatitis C Screening  Completed  . HIV Screening  Completed       Assessment  This is a routine wellness examination for PIERO MUSTARD.  Health Maintenance: Due or Overdue There are no preventive care reminders to display for this patient.  Ernest Martinez does not need a referral for Community Assistance: Care Management:   no Social Work:    no Prescription Assistance:  no Nutrition/Diabetes Education:  no   Plan:  Personalized Goals Goals Addressed            This Visit's Progress   . Patient Stated       07/11/2020 AWV Goal: Exercise for General Health   Patient will verbalize understanding of the benefits of increased physical activity:  Exercising regularly is important. It will improve your overall fitness, flexibility, and endurance.  Regular exercise also will improve your overall health. It can help you control your weight, reduce stress, and improve your bone density.  Over the next year, patient will increase physical activity as tolerated with a goal of at least 150 minutes of moderate physical activity per week.   You can tell that you are exercising at a moderate intensity if your heart starts beating faster and you start breathing faster but can still hold a  conversation.  Moderate-intensity exercise ideas include:  Walking 1 mile (1.6 km) in about 15 minutes  Biking  Hiking  Golfing  Dancing  Water aerobics  Patient will verbalize understanding of everyday activities that increase physical activity by providing examples like the following: ? Yard work, such as: ? Pushing a Conservation officer, nature ? Raking and bagging leaves ? Washing your car ? Pushing a stroller ? Shoveling snow ? Gardening ? Washing windows or floors  Patient will be able to explain general safety guidelines for exercising:   Before you start a new exercise program, talk with your health care provider.  Do not exercise so much that you hurt yourself, feel dizzy, or get very short of breath.  Wear comfortable clothes and wear shoes with good support.  Drink plenty of water while you exercise to prevent dehydration or heat stroke.  Work out until your breathing and your heartbeat get faster.       Personalized Health Maintenance & Screening Recommendations  Pneumococcal vaccine , eye exam, Flu vaccine, Colon cancer screen  Lung Cancer Screening Recommended: no (Low Dose CT Chest recommended if Age 63-80 years, 30 pack-year currently smoking OR have quit w/in past 15 years) Hepatitis C Screening recommended: no HIV Screening recommended: no  Advanced Directives: Written information was not prepared per patient's request.  Referrals & Orders No orders of the defined types were placed in this encounter.   Follow-up Plan . Follow-up with Sharion Balloon, FNP as planned . Schedule eye exam, colon cancer sceening .    I have  personally reviewed and noted the following in the patient's chart:   . Medical and social history . Use of alcohol, tobacco or illicit drugs  . Current medications and supplements . Functional ability and status . Nutritional status . Physical activity . Advanced directives . List of other physicians . Hospitalizations, surgeries,  and ER visits in previous 12 months . Vitals . Screenings to include cognitive, depression, and falls . Referrals and appointments  In addition, I have reviewed and discussed with Ernest Martinez certain preventive protocols, quality metrics, and best practice recommendations. A written personalized care plan for preventive services as well as general preventive health recommendations is available and can be mailed to the patient at his request.      Baldomero Lamy, LPN  [id number]

## 2020-07-12 ENCOUNTER — Other Ambulatory Visit: Payer: Medicare Other

## 2020-07-12 ENCOUNTER — Other Ambulatory Visit: Payer: Self-pay

## 2020-07-12 ENCOUNTER — Telehealth: Payer: Medicare Other | Admitting: "Endocrinology

## 2020-07-12 DIAGNOSIS — E0510 Thyrotoxicosis with toxic single thyroid nodule without thyrotoxic crisis or storm: Secondary | ICD-10-CM

## 2020-07-12 DIAGNOSIS — R7989 Other specified abnormal findings of blood chemistry: Secondary | ICD-10-CM

## 2020-07-12 DIAGNOSIS — E0590 Thyrotoxicosis, unspecified without thyrotoxic crisis or storm: Secondary | ICD-10-CM

## 2020-07-13 ENCOUNTER — Encounter: Payer: Self-pay | Admitting: "Endocrinology

## 2020-07-13 ENCOUNTER — Telehealth (INDEPENDENT_AMBULATORY_CARE_PROVIDER_SITE_OTHER): Payer: Medicare Other | Admitting: "Endocrinology

## 2020-07-13 DIAGNOSIS — E890 Postprocedural hypothyroidism: Secondary | ICD-10-CM | POA: Diagnosis not present

## 2020-07-13 LAB — TSH: TSH: 8.1 u[IU]/mL — ABNORMAL HIGH (ref 0.450–4.500)

## 2020-07-13 LAB — BMP8+EGFR
BUN/Creatinine Ratio: 12 (ref 10–24)
BUN: 10 mg/dL (ref 8–27)
CO2: 26 mmol/L (ref 20–29)
Calcium: 9 mg/dL (ref 8.6–10.2)
Chloride: 101 mmol/L (ref 96–106)
Creatinine, Ser: 0.84 mg/dL (ref 0.76–1.27)
GFR calc Af Amer: 108 mL/min/{1.73_m2} (ref >59)
GFR calc non Af Amer: 93 mL/min/{1.73_m2} (ref >59)
Glucose: 117 mg/dL — ABNORMAL HIGH (ref 65–99)
Potassium: 4 mmol/L (ref 3.5–5.2)
Sodium: 141 mmol/L (ref 134–144)

## 2020-07-13 LAB — T3, FREE: T3, Free: 3 pg/mL (ref 2.0–4.4)

## 2020-07-13 LAB — T4, FREE: Free T4: 1.07 ng/dL (ref 0.82–1.77)

## 2020-07-13 MED ORDER — LEVOTHYROXINE SODIUM 50 MCG PO TABS: 50.0000 ug | ORAL_TABLET | Freq: Every day | ORAL | 1 refills | Status: DC

## 2020-07-13 NOTE — Progress Notes (Signed)
07/13/2020, 2:41 PM                                  Endocrinology Telehealth Visit Follow up Note -During COVID -19 Pandemic  This visit type was conducted  via telephone due to national recommendations for restrictions regarding the COVID-19 Pandemic  in an effort to limit this patient's exposure and mitigate transmission of the corona virus.   I connected with Ernest Martinez on 07/13/2020   by telephone and verified that I am speaking with the correct person using two identifiers. Ernest Martinez, 05-01-1956. he has verbally consented to this visit.  I was in my office and patient was in his residence. No other persons were with me during the encounter. All issues noted in this document were discussed and addressed. The format was not optimal for physical exam.  Subjective:    Patient ID: Ernest Martinez, male    DOB: 1956/03/08, PCP Sharion Balloon, FNP   Past Medical History:  Diagnosis Date  . Abnormal EKG   . Bacteremia due to Gram-negative bacteria    a. 03/2013: klebsiella UTI with bacteremia.  . Bladder filling defect    a. 03/2013: Filling defect at base of bladder by CT pelvis, for f/u urology.  . Borderline diabetes   . Diabetes mellitus without complication ([place])   . Dislocation of right shoulder joint sept 2014   after fall  . GERD (gastroesophageal reflux disease)   . Hydrocele of testis    a. 03/2013: US showing Large right testicular hydrocele and small left hydrocele.  Marland Kitchen Hypertension   . Morbid obesity ([place])    Past Surgical History:  Procedure Laterality Date  . HYDROCELE EXCISION Right 08/16/2013   Procedure: HYDROCELECTOMY ADULT  Procedure: Right Scrotal Hydrocele Repair;  Surgeon: Bernestine Amass, MD;  Location: [facility];  Service: Urology;  Laterality: Right;  . None    . tumor removed right shoulder     Social History   Socioeconomic History  . Marital status: Married    Spouse name: [name]  . Number of  children: 2  . Years of education: 56  . Highest education level: High school graduate  Occupational History  . Occupation: Merchandiser, retail: SELF    Comment: on disability  Tobacco Use  . Smoking status: Never Smoker  . Smokeless tobacco: Never Used  Vaping Use  . Vaping Use: Never used  Substance and Sexual Activity  . Alcohol use: Yes    Comment: socially-maybe 6 beers a month  . Drug use: No  . Sexual activity: Not Currently  Other Topics Concern  . Not on file  Social History Narrative  . Not on file   Social Determinants of Health   Financial Resource Strain:   . Difficulty of Paying Living Expenses: Not on file  Food Insecurity:   . Worried About Charity fundraiser in the Last Year: Not on file  . Ran Out of Food in the Last Year: Not on file  Transportation Needs:   . Lack of Transportation (Medical): Not on file  . Lack of Transportation (Non-Medical):  Not on file  Physical Activity:   . Days of Exercise per Week: Not on file  . Minutes of Exercise per Session: Not on file  Stress:   . Feeling of Stress : Not on file  Social Connections:   . Frequency of Communication with Friends and Family: Not on file  . Frequency of Social Gatherings with Friends and Family: Not on file  . Attends Religious Services: Not on file  . Active Member of Clubs or Organizations: Not on file  . Attends Archivist Meetings: Not on file  . Marital Status: Not on file   Family History  Problem Relation Age of Onset  . Cancer Father        prostate cancer  . CAD Neg Hx    Outpatient Encounter Medications as of 07/13/2020  Medication Sig  . amLODipine (NORVASC) 10 MG tablet Take 1 tablet (10 mg total) by mouth every morning.  Marland Kitchen amoxicillin-clavulanate (AUGMENTIN) 875-125 MG tablet Take by mouth.  Marland Kitchen aspirin EC 81 MG tablet Take 1 tablet (81 mg total) by mouth daily.  . celecoxib (CELEBREX) 200 MG capsule Take 1 capsule (200 mg total) by mouth daily.  .  diclofenac Sodium (VOLTAREN) 1 % GEL Apply 2 g topically 4 (four) times daily.  . furosemide (LASIX) 20 MG tablet Take 1 tablet (20 mg total) by mouth every morning.  Marland Kitchen levothyroxine (SYNTHROID) 50 MCG tablet Take 1 tablet (50 mcg total) by mouth daily before breakfast.  . lidocaine-prilocaine (EMLA) cream APPLY DAILY AS NEEDED  . lisinopril-hydrochlorothiazide (ZESTORETIC) 20-12.5 MG tablet Take 1 tablet by mouth daily.  . metFORMIN (GLUCOPHAGE) 1000 MG tablet Take 1 tablet (1,000 mg total) by mouth 2 (two) times daily with a meal.  . omeprazole (PRILOSEC) 20 MG capsule Take 1 capsule (20 mg total) by mouth daily.  . ondansetron (ZOFRAN) 4 MG tablet Take 4 mg by mouth every 8 (eight) hours as needed.  Marland Kitchen oxyCODONE (OXY IR/ROXICODONE) 5 MG immediate release tablet Take by mouth.  Marland Kitchen rOPINIRole (REQUIP) 1 MG tablet Take 1 tablet (1 mg total) by mouth at bedtime.  . senna-docusate (SENOKOT-S) 8.6-50 MG tablet Take by mouth.  . traMADol (ULTRAM) 50 MG tablet Take 2 tablets (100 mg total) by mouth every 12 (twelve) hours as needed.   No facility-administered encounter medications on file as of 07/13/2020.   ALLERGIES: Allergies  Allergen Reactions  . Lovastatin     Headache and burning in stomach.  . Metoprolol Tartrate     headache  . Statins     VACCINATION STATUS: Immunization History  Administered Date(s) Administered  . Moderna SARS-COVID-2 Vaccination 01/03/2020, 01/31/2020    HPI Ernest Martinez is 64 y.o. 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  He received this treatment on August 18, 2019.   He missed his prior appointments. PCP: Sharion Balloon, FNP. His previsit labs are consistent with treatment effect with hypothyroidism.   He is not currently on any thyroid hormone supplement at this time.     He has no new complaints today.  Reportedly, he has lost 15 pounds since last visit.    Denies  palpitations, tremors, heat/cold intolerance.   Review of Systems  Limited as above  Objective:    There were no vitals taken for this visit.  Wt Readings from Last 3 Encounters:  06/26/20 (!) 342 lb (155.1 kg)  04/04/20 (!) 342 lb (155.1 kg)  01/10/20 (!) 353 lb 12.8 oz (160.5 kg)    Physical Exam   CMP     Component Value Date/Time   NA 141 07/12/2020 0940   K 4.0 07/12/2020 0940   CL 101 07/12/2020 0940   CO2 26 07/12/2020 0940   GLUCOSE 117 (H) 07/12/2020 0940   GLUCOSE 112 (H) 08/10/2013 1405   BUN 10 07/12/2020 0940   CREATININE 0.84 07/12/2020 0940   CALCIUM 9.0 07/12/2020 0940   PROT 6.9 06/26/2020 0840   ALBUMIN 3.9 06/26/2020 0840   AST 13 06/26/2020 0840   ALT 9 06/26/2020 0840   ALKPHOS 71 06/26/2020 0840   BILITOT 0.4 06/26/2020 0840   GFRNONAA 93 07/12/2020 0940   GFRAA 108 07/12/2020 0940     Diabetic Labs (most recent): Lab Results  Component Value Date   HGBA1C 6.6 06/26/2020   HGBA1C 6.3 04/04/2020   HGBA1C 6.7 01/03/2020     Lipid Panel ( most recent) Lipid Panel     Component Value Date/Time   CHOL 161 06/26/2020 0840   TRIG 77 06/26/2020 0840   HDL 50 06/26/2020 0840   CHOLHDL 3.2 06/26/2020 0840   LDLCALC 96 06/26/2020 0840      Lab Results  Component Value Date   TSH 8.100 (H) 07/12/2020   TSH 2.750 01/03/2020   TSH 0.513 09/27/2019   TSH 0.21 (L) 03/08/2019   TSH 0.161 (L) 12/21/2018   TSH 0.065 (L) 06/18/2018   FREET4 1.07 07/12/2020   FREET4 1.32 01/03/2020   FREET4 1.25 09/27/2019   FREET4 1.2 03/08/2019      Assessment & Plan:   1.  RAI induced hypothyroidism  2. toxic nodule, history of hyperthyroidism-resolved  -He is status post RAI therapy on August 13, 2019.  His most recent thyroid function tests on June 26, 2020 are consistent with treatment effect in hypothyroid range.    He would benefit from early initiation of thyroid hormone supplement.  I discussed and initiated levothyroxine 50 mcg p.o.  daily before breakfast.   - We discussed about the correct intake of his thyroid hormone, on empty stomach at fasting, with water, separated by at least 30 minutes from breakfast and other medications,  and separated by more than 4 hours from calcium, iron, multivitamins, acid reflux medications (PPIs). -Patient is made aware of the fact that thyroid hormone replacement is needed for life, dose to be adjusted by periodic monitoring of thyroid function tests.  - I advised him  to maintain close follow up with Sharion Balloon, FNP for primary care needs.     - Time spent on this patient care encounter:  20 minutes of which 50% was spent in  counseling and the rest reviewing  his current and  previous labs / studies and medications  doses and developing a plan for long term care. Ernest Martinez  participated in the discussions, expressed understanding, and voiced agreement with the above plans.  All questions were answered to his satisfaction. he is encouraged to contact clinic should he have any questions or concerns prior to his return visit.   Follow up plan: Return in about 3 months (around 10/13/2020) for F/U with Pre-visit Labs.   Glade Lloyd, MD [facility] [address] [place], [place] [address] Phone: [phone]  Fax: [phone]     07/13/2020, 2:41 PM  This note was partially dictated with voice recognition software. Similar sounding words can be transcribed inadequately or may not  be corrected upon review.

## 2020-07-18 DIAGNOSIS — Z4802 Encounter for removal of sutures: Secondary | ICD-10-CM | POA: Diagnosis not present

## 2020-07-25 DIAGNOSIS — C499 Malignant neoplasm of connective and soft tissue, unspecified: Secondary | ICD-10-CM | POA: Diagnosis not present

## 2020-07-28 DIAGNOSIS — R918 Other nonspecific abnormal finding of lung field: Secondary | ICD-10-CM | POA: Diagnosis not present

## 2020-07-28 DIAGNOSIS — C499 Malignant neoplasm of connective and soft tissue, unspecified: Secondary | ICD-10-CM | POA: Diagnosis not present

## 2020-07-28 DIAGNOSIS — C7801 Secondary malignant neoplasm of right lung: Secondary | ICD-10-CM | POA: Diagnosis not present

## 2020-07-28 DIAGNOSIS — K7689 Other specified diseases of liver: Secondary | ICD-10-CM | POA: Diagnosis not present

## 2020-07-28 DIAGNOSIS — Z23 Encounter for immunization: Secondary | ICD-10-CM | POA: Diagnosis not present

## 2020-07-28 DIAGNOSIS — C7802 Secondary malignant neoplasm of left lung: Secondary | ICD-10-CM | POA: Diagnosis not present

## 2020-07-28 DIAGNOSIS — R590 Localized enlarged lymph nodes: Secondary | ICD-10-CM | POA: Diagnosis not present

## 2020-07-28 DIAGNOSIS — Z5111 Encounter for antineoplastic chemotherapy: Secondary | ICD-10-CM | POA: Diagnosis not present

## 2020-07-28 DIAGNOSIS — C4911 Malignant neoplasm of connective and soft tissue of right upper limb, including shoulder: Secondary | ICD-10-CM | POA: Diagnosis not present

## 2020-07-29 DIAGNOSIS — R0981 Nasal congestion: Secondary | ICD-10-CM | POA: Diagnosis not present

## 2020-07-29 DIAGNOSIS — Z6841 Body Mass Index (BMI) 40.0 and over, adult: Secondary | ICD-10-CM | POA: Diagnosis not present

## 2020-07-29 DIAGNOSIS — R059 Cough, unspecified: Secondary | ICD-10-CM | POA: Diagnosis not present

## 2020-08-04 DIAGNOSIS — R059 Cough, unspecified: Secondary | ICD-10-CM | POA: Diagnosis not present

## 2020-08-04 DIAGNOSIS — J40 Bronchitis, not specified as acute or chronic: Secondary | ICD-10-CM | POA: Diagnosis not present

## 2020-08-04 DIAGNOSIS — R062 Wheezing: Secondary | ICD-10-CM | POA: Diagnosis not present

## 2020-08-07 ENCOUNTER — Other Ambulatory Visit: Payer: Self-pay | Admitting: Family

## 2020-08-08 DIAGNOSIS — C499 Malignant neoplasm of connective and soft tissue, unspecified: Secondary | ICD-10-CM | POA: Diagnosis not present

## 2020-08-08 DIAGNOSIS — Z5111 Encounter for antineoplastic chemotherapy: Secondary | ICD-10-CM | POA: Diagnosis not present

## 2020-08-11 DIAGNOSIS — Z79899 Other long term (current) drug therapy: Secondary | ICD-10-CM | POA: Diagnosis not present

## 2020-08-11 DIAGNOSIS — Z5111 Encounter for antineoplastic chemotherapy: Secondary | ICD-10-CM | POA: Diagnosis not present

## 2020-08-11 DIAGNOSIS — C7801 Secondary malignant neoplasm of right lung: Secondary | ICD-10-CM | POA: Diagnosis not present

## 2020-08-11 DIAGNOSIS — R110 Nausea: Secondary | ICD-10-CM | POA: Diagnosis not present

## 2020-08-11 DIAGNOSIS — Z7952 Long term (current) use of systemic steroids: Secondary | ICD-10-CM | POA: Diagnosis not present

## 2020-08-11 DIAGNOSIS — C7802 Secondary malignant neoplasm of left lung: Secondary | ICD-10-CM | POA: Diagnosis not present

## 2020-08-11 DIAGNOSIS — C7800 Secondary malignant neoplasm of unspecified lung: Secondary | ICD-10-CM | POA: Diagnosis not present

## 2020-08-11 DIAGNOSIS — T451X5D Adverse effect of antineoplastic and immunosuppressive drugs, subsequent encounter: Secondary | ICD-10-CM | POA: Diagnosis not present

## 2020-08-11 DIAGNOSIS — Z7984 Long term (current) use of oral hypoglycemic drugs: Secondary | ICD-10-CM | POA: Diagnosis not present

## 2020-08-11 DIAGNOSIS — C499 Malignant neoplasm of connective and soft tissue, unspecified: Secondary | ICD-10-CM | POA: Diagnosis not present

## 2020-08-11 DIAGNOSIS — Z791 Long term (current) use of non-steroidal anti-inflammatories (NSAID): Secondary | ICD-10-CM | POA: Diagnosis not present

## 2020-08-11 DIAGNOSIS — C4911 Malignant neoplasm of connective and soft tissue of right upper limb, including shoulder: Secondary | ICD-10-CM | POA: Diagnosis not present

## 2020-08-11 DIAGNOSIS — Z23 Encounter for immunization: Secondary | ICD-10-CM | POA: Diagnosis not present

## 2020-08-15 DIAGNOSIS — C499 Malignant neoplasm of connective and soft tissue, unspecified: Secondary | ICD-10-CM | POA: Diagnosis not present

## 2020-08-18 DIAGNOSIS — Z7952 Long term (current) use of systemic steroids: Secondary | ICD-10-CM | POA: Diagnosis not present

## 2020-08-18 DIAGNOSIS — C4911 Malignant neoplasm of connective and soft tissue of right upper limb, including shoulder: Secondary | ICD-10-CM | POA: Diagnosis not present

## 2020-08-18 DIAGNOSIS — C7801 Secondary malignant neoplasm of right lung: Secondary | ICD-10-CM | POA: Diagnosis not present

## 2020-08-18 DIAGNOSIS — Z5111 Encounter for antineoplastic chemotherapy: Secondary | ICD-10-CM | POA: Diagnosis not present

## 2020-08-18 DIAGNOSIS — Z79899 Other long term (current) drug therapy: Secondary | ICD-10-CM | POA: Diagnosis not present

## 2020-08-18 DIAGNOSIS — C7802 Secondary malignant neoplasm of left lung: Secondary | ICD-10-CM | POA: Diagnosis not present

## 2020-08-18 DIAGNOSIS — Z791 Long term (current) use of non-steroidal anti-inflammatories (NSAID): Secondary | ICD-10-CM | POA: Diagnosis not present

## 2020-08-18 DIAGNOSIS — C499 Malignant neoplasm of connective and soft tissue, unspecified: Secondary | ICD-10-CM | POA: Diagnosis not present

## 2020-08-18 DIAGNOSIS — Z7984 Long term (current) use of oral hypoglycemic drugs: Secondary | ICD-10-CM | POA: Diagnosis not present

## 2020-09-01 DIAGNOSIS — Z791 Long term (current) use of non-steroidal anti-inflammatories (NSAID): Secondary | ICD-10-CM | POA: Diagnosis not present

## 2020-09-01 DIAGNOSIS — Z7952 Long term (current) use of systemic steroids: Secondary | ICD-10-CM | POA: Diagnosis not present

## 2020-09-01 DIAGNOSIS — Z7984 Long term (current) use of oral hypoglycemic drugs: Secondary | ICD-10-CM | POA: Diagnosis not present

## 2020-09-01 DIAGNOSIS — C4911 Malignant neoplasm of connective and soft tissue of right upper limb, including shoulder: Secondary | ICD-10-CM | POA: Diagnosis not present

## 2020-09-01 DIAGNOSIS — C7802 Secondary malignant neoplasm of left lung: Secondary | ICD-10-CM | POA: Diagnosis not present

## 2020-09-01 DIAGNOSIS — Z79899 Other long term (current) drug therapy: Secondary | ICD-10-CM | POA: Diagnosis not present

## 2020-09-01 DIAGNOSIS — Z5111 Encounter for antineoplastic chemotherapy: Secondary | ICD-10-CM | POA: Diagnosis not present

## 2020-09-01 DIAGNOSIS — C7801 Secondary malignant neoplasm of right lung: Secondary | ICD-10-CM | POA: Diagnosis not present

## 2020-09-05 ENCOUNTER — Other Ambulatory Visit: Payer: Self-pay | Admitting: Family

## 2020-09-05 DIAGNOSIS — M1990 Unspecified osteoarthritis, unspecified site: Secondary | ICD-10-CM

## 2020-09-05 DIAGNOSIS — F1120 Opioid dependence, uncomplicated: Secondary | ICD-10-CM

## 2020-09-05 DIAGNOSIS — Z0289 Encounter for other administrative examinations: Secondary | ICD-10-CM

## 2020-09-08 ENCOUNTER — Telehealth: Payer: Self-pay | Admitting: Family

## 2020-09-08 DIAGNOSIS — J441 Chronic obstructive pulmonary disease with (acute) exacerbation: Secondary | ICD-10-CM | POA: Diagnosis not present

## 2020-09-08 DIAGNOSIS — R918 Other nonspecific abnormal finding of lung field: Secondary | ICD-10-CM | POA: Diagnosis not present

## 2020-09-08 DIAGNOSIS — C7989 Secondary malignant neoplasm of other specified sites: Secondary | ICD-10-CM | POA: Diagnosis not present

## 2020-09-08 DIAGNOSIS — J189 Pneumonia, unspecified organism: Secondary | ICD-10-CM | POA: Diagnosis not present

## 2020-09-08 DIAGNOSIS — C499 Malignant neoplasm of connective and soft tissue, unspecified: Secondary | ICD-10-CM | POA: Diagnosis not present

## 2020-09-08 DIAGNOSIS — J90 Pleural effusion, not elsewhere classified: Secondary | ICD-10-CM | POA: Diagnosis not present

## 2020-09-08 NOTE — Telephone Encounter (Signed)
PCP please advise.

## 2020-09-08 NOTE — Telephone Encounter (Signed)
[place] for patient to come have lab work. Does he need A1C or just glucose?

## 2020-09-08 NOTE — Telephone Encounter (Signed)
Attempted to contact patient - NA °

## 2020-09-08 NOTE — Telephone Encounter (Signed)
Pt needs to have blood work done to check sugar per oncologist before having chemo again. Needs to be done by next Wednesday if possible. Can an order be put in?

## 2020-09-12 ENCOUNTER — Other Ambulatory Visit: Payer: Self-pay

## 2020-09-12 ENCOUNTER — Other Ambulatory Visit: Payer: Medicare Other

## 2020-09-12 DIAGNOSIS — Z5111 Encounter for antineoplastic chemotherapy: Secondary | ICD-10-CM | POA: Diagnosis not present

## 2020-09-12 DIAGNOSIS — C7989 Secondary malignant neoplasm of other specified sites: Secondary | ICD-10-CM | POA: Diagnosis not present

## 2020-09-14 NOTE — Telephone Encounter (Signed)
No call back - this encounter will be closed.  

## 2020-09-19 ENCOUNTER — Telehealth: Payer: Self-pay

## 2020-09-19 DIAGNOSIS — C7802 Secondary malignant neoplasm of left lung: Secondary | ICD-10-CM | POA: Diagnosis not present

## 2020-09-19 DIAGNOSIS — Z79899 Other long term (current) drug therapy: Secondary | ICD-10-CM | POA: Diagnosis not present

## 2020-09-19 DIAGNOSIS — C4911 Malignant neoplasm of connective and soft tissue of right upper limb, including shoulder: Secondary | ICD-10-CM | POA: Diagnosis not present

## 2020-09-19 DIAGNOSIS — Z5111 Encounter for antineoplastic chemotherapy: Secondary | ICD-10-CM | POA: Diagnosis not present

## 2020-09-19 DIAGNOSIS — C7801 Secondary malignant neoplasm of right lung: Secondary | ICD-10-CM | POA: Diagnosis not present

## 2020-09-19 NOTE — Telephone Encounter (Signed)
Will wait for fax and send back to DR then

## 2020-09-26 ENCOUNTER — Ambulatory Visit: Payer: Medicare Other | Admitting: Family

## 2020-09-26 DIAGNOSIS — C499 Malignant neoplasm of connective and soft tissue, unspecified: Secondary | ICD-10-CM | POA: Diagnosis not present

## 2020-09-26 DIAGNOSIS — C7989 Secondary malignant neoplasm of other specified sites: Secondary | ICD-10-CM | POA: Diagnosis not present

## 2020-09-28 ENCOUNTER — Other Ambulatory Visit: Payer: Self-pay | Admitting: Family

## 2020-10-03 ENCOUNTER — Telehealth: Payer: Self-pay

## 2020-10-03 NOTE — Telephone Encounter (Signed)
Aware and verbalizes understanding.  

## 2020-10-03 NOTE — Telephone Encounter (Signed)
Ok to do televisit, but next visit will have to be face to face.

## 2020-10-10 ENCOUNTER — Other Ambulatory Visit: Payer: Self-pay | Admitting: "Endocrinology

## 2020-10-10 ENCOUNTER — Other Ambulatory Visit: Payer: Self-pay | Admitting: Family

## 2020-10-10 ENCOUNTER — Ambulatory Visit: Payer: Medicare Other | Admitting: Family Medicine

## 2020-10-10 DIAGNOSIS — M1990 Unspecified osteoarthritis, unspecified site: Secondary | ICD-10-CM

## 2020-10-10 DIAGNOSIS — Z0289 Encounter for other administrative examinations: Secondary | ICD-10-CM

## 2020-10-10 DIAGNOSIS — F1120 Opioid dependence, uncomplicated: Secondary | ICD-10-CM

## 2020-10-13 ENCOUNTER — Ambulatory Visit: Payer: Medicare Other | Admitting: "Endocrinology

## 2020-10-13 DIAGNOSIS — C7800 Secondary malignant neoplasm of unspecified lung: Secondary | ICD-10-CM | POA: Diagnosis present

## 2020-10-13 DIAGNOSIS — Z9221 Personal history of antineoplastic chemotherapy: Secondary | ICD-10-CM | POA: Diagnosis not present

## 2020-10-13 DIAGNOSIS — R0902 Hypoxemia: Secondary | ICD-10-CM | POA: Diagnosis present

## 2020-10-13 DIAGNOSIS — R000 Tachycardia, unspecified: Secondary | ICD-10-CM | POA: Diagnosis not present

## 2020-10-13 DIAGNOSIS — N2889 Other specified disorders of kidney and ureter: Secondary | ICD-10-CM | POA: Diagnosis not present

## 2020-10-13 DIAGNOSIS — R590 Localized enlarged lymph nodes: Secondary | ICD-10-CM | POA: Diagnosis not present

## 2020-10-13 DIAGNOSIS — I509 Heart failure, unspecified: Secondary | ICD-10-CM | POA: Diagnosis not present

## 2020-10-13 DIAGNOSIS — N390 Urinary tract infection, site not specified: Secondary | ICD-10-CM | POA: Diagnosis present

## 2020-10-13 DIAGNOSIS — T451X5A Adverse effect of antineoplastic and immunosuppressive drugs, initial encounter: Secondary | ICD-10-CM | POA: Diagnosis present

## 2020-10-13 DIAGNOSIS — R627 Adult failure to thrive: Secondary | ICD-10-CM | POA: Diagnosis present

## 2020-10-13 DIAGNOSIS — N179 Acute kidney failure, unspecified: Secondary | ICD-10-CM | POA: Diagnosis present

## 2020-10-13 DIAGNOSIS — E119 Type 2 diabetes mellitus without complications: Secondary | ICD-10-CM | POA: Diagnosis present

## 2020-10-13 DIAGNOSIS — J810 Acute pulmonary edema: Secondary | ICD-10-CM | POA: Diagnosis not present

## 2020-10-13 DIAGNOSIS — R918 Other nonspecific abnormal finding of lung field: Secondary | ICD-10-CM | POA: Diagnosis not present

## 2020-10-13 DIAGNOSIS — F439 Reaction to severe stress, unspecified: Secondary | ICD-10-CM | POA: Diagnosis not present

## 2020-10-13 DIAGNOSIS — D72829 Elevated white blood cell count, unspecified: Secondary | ICD-10-CM | POA: Diagnosis not present

## 2020-10-13 DIAGNOSIS — I491 Atrial premature depolarization: Secondary | ICD-10-CM | POA: Diagnosis not present

## 2020-10-13 DIAGNOSIS — I498 Other specified cardiac arrhythmias: Secondary | ICD-10-CM | POA: Diagnosis not present

## 2020-10-13 DIAGNOSIS — J9601 Acute respiratory failure with hypoxia: Secondary | ICD-10-CM | POA: Diagnosis not present

## 2020-10-13 DIAGNOSIS — B9620 Unspecified Escherichia coli [E. coli] as the cause of diseases classified elsewhere: Secondary | ICD-10-CM | POA: Diagnosis present

## 2020-10-13 DIAGNOSIS — G4733 Obstructive sleep apnea (adult) (pediatric): Secondary | ICD-10-CM | POA: Diagnosis present

## 2020-10-13 DIAGNOSIS — R439 Unspecified disturbances of smell and taste: Secondary | ICD-10-CM | POA: Diagnosis present

## 2020-10-13 DIAGNOSIS — Z7984 Long term (current) use of oral hypoglycemic drugs: Secondary | ICD-10-CM | POA: Diagnosis not present

## 2020-10-13 DIAGNOSIS — G4730 Sleep apnea, unspecified: Secondary | ICD-10-CM | POA: Diagnosis not present

## 2020-10-13 DIAGNOSIS — G2581 Restless legs syndrome: Secondary | ICD-10-CM | POA: Diagnosis present

## 2020-10-13 DIAGNOSIS — C7801 Secondary malignant neoplasm of right lung: Secondary | ICD-10-CM | POA: Diagnosis not present

## 2020-10-13 DIAGNOSIS — I454 Nonspecific intraventricular block: Secondary | ICD-10-CM | POA: Diagnosis not present

## 2020-10-13 DIAGNOSIS — I5030 Unspecified diastolic (congestive) heart failure: Secondary | ICD-10-CM | POA: Diagnosis not present

## 2020-10-13 DIAGNOSIS — Z79899 Other long term (current) drug therapy: Secondary | ICD-10-CM | POA: Diagnosis not present

## 2020-10-13 DIAGNOSIS — K219 Gastro-esophageal reflux disease without esophagitis: Secondary | ICD-10-CM | POA: Diagnosis present

## 2020-10-13 DIAGNOSIS — J90 Pleural effusion, not elsewhere classified: Secondary | ICD-10-CM | POA: Diagnosis not present

## 2020-10-13 DIAGNOSIS — I493 Ventricular premature depolarization: Secondary | ICD-10-CM | POA: Diagnosis not present

## 2020-10-13 DIAGNOSIS — Z6841 Body Mass Index (BMI) 40.0 and over, adult: Secondary | ICD-10-CM | POA: Diagnosis not present

## 2020-10-13 DIAGNOSIS — I10 Essential (primary) hypertension: Secondary | ICD-10-CM | POA: Diagnosis not present

## 2020-10-13 DIAGNOSIS — E8770 Fluid overload, unspecified: Secondary | ICD-10-CM | POA: Diagnosis not present

## 2020-10-13 DIAGNOSIS — I517 Cardiomegaly: Secondary | ICD-10-CM | POA: Diagnosis not present

## 2020-10-13 DIAGNOSIS — R110 Nausea: Secondary | ICD-10-CM | POA: Diagnosis present

## 2020-10-13 DIAGNOSIS — C7802 Secondary malignant neoplasm of left lung: Secondary | ICD-10-CM | POA: Diagnosis not present

## 2020-10-13 DIAGNOSIS — J918 Pleural effusion in other conditions classified elsewhere: Secondary | ICD-10-CM | POA: Diagnosis not present

## 2020-10-13 DIAGNOSIS — I472 Ventricular tachycardia: Secondary | ICD-10-CM | POA: Diagnosis not present

## 2020-10-13 DIAGNOSIS — C7989 Secondary malignant neoplasm of other specified sites: Secondary | ICD-10-CM | POA: Diagnosis not present

## 2020-10-13 DIAGNOSIS — R140 Abdominal distension (gaseous): Secondary | ICD-10-CM | POA: Diagnosis not present

## 2020-10-13 DIAGNOSIS — E860 Dehydration: Secondary | ICD-10-CM | POA: Diagnosis present

## 2020-10-13 DIAGNOSIS — C499 Malignant neoplasm of connective and soft tissue, unspecified: Secondary | ICD-10-CM | POA: Diagnosis present

## 2020-10-13 DIAGNOSIS — R0602 Shortness of breath: Secondary | ICD-10-CM | POA: Diagnosis not present

## 2020-10-13 DIAGNOSIS — E039 Hypothyroidism, unspecified: Secondary | ICD-10-CM | POA: Diagnosis present

## 2020-10-16 ENCOUNTER — Encounter: Payer: Self-pay | Admitting: Family

## 2020-10-16 ENCOUNTER — Ambulatory Visit (INDEPENDENT_AMBULATORY_CARE_PROVIDER_SITE_OTHER): Payer: Medicare Other | Admitting: Family

## 2020-10-16 NOTE — Progress Notes (Signed)
Pt is currently hospitalized. Will reschedule appointment.   Evelina Dun, FNP

## 2020-10-20 ENCOUNTER — Ambulatory Visit: Payer: Medicare Other | Admitting: Family

## 2020-10-20 ENCOUNTER — Telehealth: Payer: Self-pay

## 2020-10-20 NOTE — Telephone Encounter (Signed)
[facility] hospital for a return call about a hospital follow up appt. Appt scheduled with [facility] for 2 weeks out.

## 2020-10-22 DIAGNOSIS — C7931 Secondary malignant neoplasm of brain: Secondary | ICD-10-CM | POA: Diagnosis not present

## 2020-10-22 DIAGNOSIS — Z20822 Contact with and (suspected) exposure to covid-19: Secondary | ICD-10-CM | POA: Diagnosis not present

## 2020-10-22 DIAGNOSIS — D6481 Anemia due to antineoplastic chemotherapy: Secondary | ICD-10-CM | POA: Diagnosis not present

## 2020-10-22 DIAGNOSIS — E039 Hypothyroidism, unspecified: Secondary | ICD-10-CM | POA: Diagnosis not present

## 2020-10-22 DIAGNOSIS — T451X5A Adverse effect of antineoplastic and immunosuppressive drugs, initial encounter: Secondary | ICD-10-CM | POA: Diagnosis not present

## 2020-10-22 DIAGNOSIS — I5022 Chronic systolic (congestive) heart failure: Secondary | ICD-10-CM | POA: Diagnosis not present

## 2020-10-22 DIAGNOSIS — Z6841 Body Mass Index (BMI) 40.0 and over, adult: Secondary | ICD-10-CM | POA: Diagnosis not present

## 2020-10-22 DIAGNOSIS — Z95828 Presence of other vascular implants and grafts: Secondary | ICD-10-CM | POA: Diagnosis not present

## 2020-10-22 DIAGNOSIS — K219 Gastro-esophageal reflux disease without esophagitis: Secondary | ICD-10-CM | POA: Diagnosis present

## 2020-10-22 DIAGNOSIS — G2581 Restless legs syndrome: Secondary | ICD-10-CM | POA: Diagnosis present

## 2020-10-22 DIAGNOSIS — F32A Depression, unspecified: Secondary | ICD-10-CM | POA: Diagnosis not present

## 2020-10-22 DIAGNOSIS — G9389 Other specified disorders of brain: Secondary | ICD-10-CM | POA: Diagnosis not present

## 2020-10-22 DIAGNOSIS — I110 Hypertensive heart disease with heart failure: Secondary | ICD-10-CM | POA: Diagnosis not present

## 2020-10-22 DIAGNOSIS — Z809 Family history of malignant neoplasm, unspecified: Secondary | ICD-10-CM | POA: Diagnosis not present

## 2020-10-22 DIAGNOSIS — R0602 Shortness of breath: Secondary | ICD-10-CM | POA: Diagnosis not present

## 2020-10-22 DIAGNOSIS — E785 Hyperlipidemia, unspecified: Secondary | ICD-10-CM | POA: Diagnosis present

## 2020-10-22 DIAGNOSIS — E119 Type 2 diabetes mellitus without complications: Secondary | ICD-10-CM | POA: Diagnosis not present

## 2020-10-22 DIAGNOSIS — J90 Pleural effusion, not elsewhere classified: Secondary | ICD-10-CM | POA: Diagnosis not present

## 2020-10-22 DIAGNOSIS — E1169 Type 2 diabetes mellitus with other specified complication: Secondary | ICD-10-CM | POA: Diagnosis present

## 2020-10-22 DIAGNOSIS — E8779 Other fluid overload: Secondary | ICD-10-CM | POA: Diagnosis not present

## 2020-10-22 DIAGNOSIS — Z8701 Personal history of pneumonia (recurrent): Secondary | ICD-10-CM | POA: Diagnosis not present

## 2020-10-22 DIAGNOSIS — I10 Essential (primary) hypertension: Secondary | ICD-10-CM | POA: Diagnosis not present

## 2020-10-22 DIAGNOSIS — Z515 Encounter for palliative care: Secondary | ICD-10-CM | POA: Diagnosis not present

## 2020-10-22 DIAGNOSIS — J9811 Atelectasis: Secondary | ICD-10-CM | POA: Diagnosis not present

## 2020-10-22 DIAGNOSIS — J9601 Acute respiratory failure with hypoxia: Secondary | ICD-10-CM | POA: Diagnosis present

## 2020-10-22 DIAGNOSIS — Z79890 Hormone replacement therapy (postmenopausal): Secondary | ICD-10-CM | POA: Diagnosis not present

## 2020-10-22 DIAGNOSIS — K5900 Constipation, unspecified: Secondary | ICD-10-CM | POA: Diagnosis not present

## 2020-10-22 DIAGNOSIS — Z7984 Long term (current) use of oral hypoglycemic drugs: Secondary | ICD-10-CM | POA: Diagnosis not present

## 2020-10-22 DIAGNOSIS — R918 Other nonspecific abnormal finding of lung field: Secondary | ICD-10-CM | POA: Diagnosis not present

## 2020-10-22 DIAGNOSIS — D72829 Elevated white blood cell count, unspecified: Secondary | ICD-10-CM | POA: Diagnosis not present

## 2020-10-22 DIAGNOSIS — C7989 Secondary malignant neoplasm of other specified sites: Secondary | ICD-10-CM | POA: Diagnosis not present

## 2020-10-22 DIAGNOSIS — J918 Pleural effusion in other conditions classified elsewhere: Secondary | ICD-10-CM | POA: Diagnosis not present

## 2020-10-22 DIAGNOSIS — R846 Abnormal cytological findings in specimens from respiratory organs and thorax: Secondary | ICD-10-CM | POA: Diagnosis not present

## 2020-10-22 DIAGNOSIS — R400 Somnolence: Secondary | ICD-10-CM | POA: Diagnosis not present

## 2020-10-22 DIAGNOSIS — R112 Nausea with vomiting, unspecified: Secondary | ICD-10-CM | POA: Diagnosis not present

## 2020-10-22 DIAGNOSIS — C499 Malignant neoplasm of connective and soft tissue, unspecified: Secondary | ICD-10-CM | POA: Diagnosis not present

## 2020-10-22 DIAGNOSIS — I509 Heart failure, unspecified: Secondary | ICD-10-CM | POA: Diagnosis not present

## 2020-10-22 DIAGNOSIS — G4733 Obstructive sleep apnea (adult) (pediatric): Secondary | ICD-10-CM | POA: Diagnosis present

## 2020-10-23 DIAGNOSIS — J90 Pleural effusion, not elsewhere classified: Secondary | ICD-10-CM | POA: Diagnosis not present

## 2020-10-24 DIAGNOSIS — J90 Pleural effusion, not elsewhere classified: Secondary | ICD-10-CM | POA: Diagnosis not present

## 2020-10-25 DIAGNOSIS — J90 Pleural effusion, not elsewhere classified: Secondary | ICD-10-CM | POA: Diagnosis not present

## 2020-10-25 DIAGNOSIS — R918 Other nonspecific abnormal finding of lung field: Secondary | ICD-10-CM | POA: Diagnosis not present

## 2020-10-25 DIAGNOSIS — J9811 Atelectasis: Secondary | ICD-10-CM | POA: Diagnosis not present

## 2020-10-27 DIAGNOSIS — C499 Malignant neoplasm of connective and soft tissue, unspecified: Secondary | ICD-10-CM | POA: Diagnosis not present

## 2020-10-27 DIAGNOSIS — C7931 Secondary malignant neoplasm of brain: Secondary | ICD-10-CM | POA: Diagnosis not present

## 2020-10-29 DIAGNOSIS — C7931 Secondary malignant neoplasm of brain: Secondary | ICD-10-CM | POA: Diagnosis not present

## 2020-10-29 DIAGNOSIS — C7989 Secondary malignant neoplasm of other specified sites: Secondary | ICD-10-CM | POA: Diagnosis not present

## 2020-10-30 ENCOUNTER — Ambulatory Visit: Payer: Medicare Other | Admitting: Family Medicine

## 2020-10-30 DIAGNOSIS — K5900 Constipation, unspecified: Secondary | ICD-10-CM | POA: Diagnosis not present

## 2020-10-30 DIAGNOSIS — C7931 Secondary malignant neoplasm of brain: Secondary | ICD-10-CM | POA: Diagnosis not present

## 2020-11-01 ENCOUNTER — Ambulatory Visit: Payer: Medicare Other | Admitting: Family

## 2020-11-01 DIAGNOSIS — Z9981 Dependence on supplemental oxygen: Secondary | ICD-10-CM | POA: Diagnosis not present

## 2020-11-01 DIAGNOSIS — I110 Hypertensive heart disease with heart failure: Secondary | ICD-10-CM | POA: Diagnosis not present

## 2020-11-01 DIAGNOSIS — G2581 Restless legs syndrome: Secondary | ICD-10-CM | POA: Diagnosis not present

## 2020-11-01 DIAGNOSIS — E119 Type 2 diabetes mellitus without complications: Secondary | ICD-10-CM | POA: Diagnosis not present

## 2020-11-01 DIAGNOSIS — G4733 Obstructive sleep apnea (adult) (pediatric): Secondary | ICD-10-CM | POA: Diagnosis not present

## 2020-11-01 DIAGNOSIS — C4911 Malignant neoplasm of connective and soft tissue of right upper limb, including shoulder: Secondary | ICD-10-CM | POA: Diagnosis not present

## 2020-11-01 DIAGNOSIS — I509 Heart failure, unspecified: Secondary | ICD-10-CM | POA: Diagnosis not present

## 2020-11-01 DIAGNOSIS — K219 Gastro-esophageal reflux disease without esophagitis: Secondary | ICD-10-CM | POA: Diagnosis not present

## 2020-11-01 DIAGNOSIS — N390 Urinary tract infection, site not specified: Secondary | ICD-10-CM | POA: Diagnosis not present

## 2020-11-01 DIAGNOSIS — R112 Nausea with vomiting, unspecified: Secondary | ICD-10-CM | POA: Diagnosis not present

## 2020-11-01 DIAGNOSIS — E039 Hypothyroidism, unspecified: Secondary | ICD-10-CM | POA: Diagnosis not present

## 2020-11-01 DIAGNOSIS — C7989 Secondary malignant neoplasm of other specified sites: Secondary | ICD-10-CM | POA: Diagnosis not present

## 2020-11-01 DIAGNOSIS — C7931 Secondary malignant neoplasm of brain: Secondary | ICD-10-CM | POA: Diagnosis not present

## 2020-11-01 DIAGNOSIS — R471 Dysarthria and anarthria: Secondary | ICD-10-CM | POA: Diagnosis not present

## 2020-11-01 DIAGNOSIS — N179 Acute kidney failure, unspecified: Secondary | ICD-10-CM | POA: Diagnosis not present

## 2020-11-01 DIAGNOSIS — J90 Pleural effusion, not elsewhere classified: Secondary | ICD-10-CM | POA: Diagnosis not present

## 2020-11-01 DIAGNOSIS — D72829 Elevated white blood cell count, unspecified: Secondary | ICD-10-CM | POA: Diagnosis not present

## 2020-11-01 DIAGNOSIS — Z9221 Personal history of antineoplastic chemotherapy: Secondary | ICD-10-CM | POA: Diagnosis not present

## 2020-11-01 DIAGNOSIS — F329 Major depressive disorder, single episode, unspecified: Secondary | ICD-10-CM | POA: Diagnosis not present

## 2020-11-01 DIAGNOSIS — J9601 Acute respiratory failure with hypoxia: Secondary | ICD-10-CM | POA: Diagnosis not present

## 2020-11-01 DIAGNOSIS — J189 Pneumonia, unspecified organism: Secondary | ICD-10-CM | POA: Diagnosis not present

## 2020-11-02 ENCOUNTER — Encounter: Payer: Self-pay | Admitting: Family

## 2020-11-02 DIAGNOSIS — K219 Gastro-esophageal reflux disease without esophagitis: Secondary | ICD-10-CM | POA: Diagnosis not present

## 2020-11-02 DIAGNOSIS — C7989 Secondary malignant neoplasm of other specified sites: Secondary | ICD-10-CM | POA: Diagnosis not present

## 2020-11-02 DIAGNOSIS — C7931 Secondary malignant neoplasm of brain: Secondary | ICD-10-CM | POA: Diagnosis not present

## 2020-11-02 DIAGNOSIS — R471 Dysarthria and anarthria: Secondary | ICD-10-CM | POA: Diagnosis not present

## 2020-11-02 DIAGNOSIS — E039 Hypothyroidism, unspecified: Secondary | ICD-10-CM | POA: Diagnosis not present

## 2020-11-02 DIAGNOSIS — C4911 Malignant neoplasm of connective and soft tissue of right upper limb, including shoulder: Secondary | ICD-10-CM | POA: Diagnosis not present

## 2020-11-03 DIAGNOSIS — C4911 Malignant neoplasm of connective and soft tissue of right upper limb, including shoulder: Secondary | ICD-10-CM | POA: Diagnosis not present

## 2020-11-03 DIAGNOSIS — R471 Dysarthria and anarthria: Secondary | ICD-10-CM | POA: Diagnosis not present

## 2020-11-03 DIAGNOSIS — C7931 Secondary malignant neoplasm of brain: Secondary | ICD-10-CM | POA: Diagnosis not present

## 2020-11-03 DIAGNOSIS — C7989 Secondary malignant neoplasm of other specified sites: Secondary | ICD-10-CM | POA: Diagnosis not present

## 2020-11-03 DIAGNOSIS — E039 Hypothyroidism, unspecified: Secondary | ICD-10-CM | POA: Diagnosis not present

## 2020-11-03 DIAGNOSIS — K219 Gastro-esophageal reflux disease without esophagitis: Secondary | ICD-10-CM | POA: Diagnosis not present

## 2020-11-04 DIAGNOSIS — C7989 Secondary malignant neoplasm of other specified sites: Secondary | ICD-10-CM | POA: Diagnosis not present

## 2020-11-04 DIAGNOSIS — K219 Gastro-esophageal reflux disease without esophagitis: Secondary | ICD-10-CM | POA: Diagnosis not present

## 2020-11-04 DIAGNOSIS — E039 Hypothyroidism, unspecified: Secondary | ICD-10-CM | POA: Diagnosis not present

## 2020-11-04 DIAGNOSIS — C7931 Secondary malignant neoplasm of brain: Secondary | ICD-10-CM | POA: Diagnosis not present

## 2020-11-04 DIAGNOSIS — C4911 Malignant neoplasm of connective and soft tissue of right upper limb, including shoulder: Secondary | ICD-10-CM | POA: Diagnosis not present

## 2020-11-04 DIAGNOSIS — R471 Dysarthria and anarthria: Secondary | ICD-10-CM | POA: Diagnosis not present

## 2020-11-07 ENCOUNTER — Telehealth: Payer: Self-pay

## 2020-11-07 NOTE — Telephone Encounter (Signed)
How sad. He will be missed.

## 2020-11-08 ENCOUNTER — Ambulatory Visit: Payer: Medicare Other | Admitting: Family Medicine

## 2020-11-14 ENCOUNTER — Ambulatory Visit: Payer: Medicare Other | Admitting: Family

## 2020-11-21 DEATH — deceased

## 2021-09-29 IMAGING — MR MR SHOULDER*R* WO/W CM
8 series · 40 of 40 positions shown · IV contrast (gadavist)
Comparison: Ultrasound of the region of concern 04/19/2020. Plain
films right shoulder 04/04/2020.

CLINICAL DATA: Mass on the right shoulder since the patient
suffered a fall and shoulder dislocation 6 years ago. The patient
reports the lesion is enlarging.

EXAM:
MRI OF THE RIGHT SHOULDER WITHOUT AND WITH CONTRAST
TECHNIQUE: Multiplanar, multisequence MR imaging of the right shoulder was
performed before and after the administration of intravenous
contrast.
CONTRAST:  10 mL GADAVIST IV SOLN

[Series 9: PD fat-sat · axial · right · 4.0mm · 0.52mm/px · z∈[-105,+26]mm · 5 of 29 slices shown (1 of 2)]
[im 1/29]
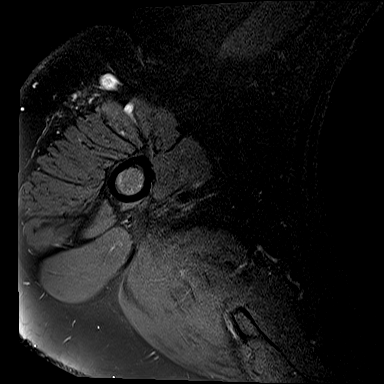
[im 8/29]
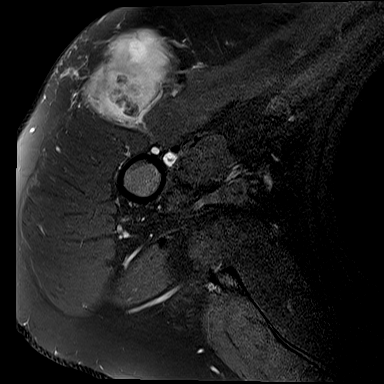
[im 15/29]
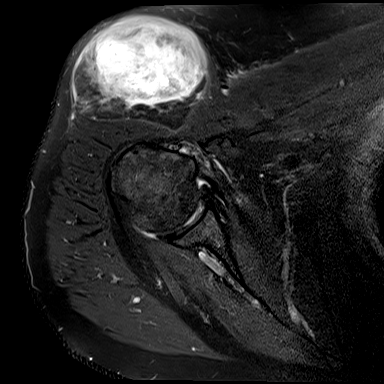
[im 22/29]
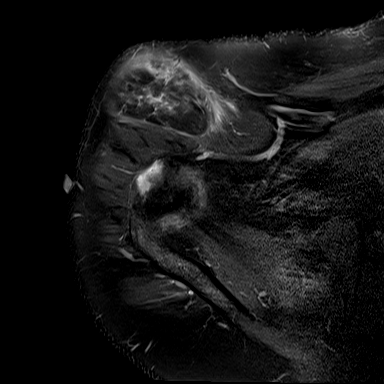
[im 29/29]
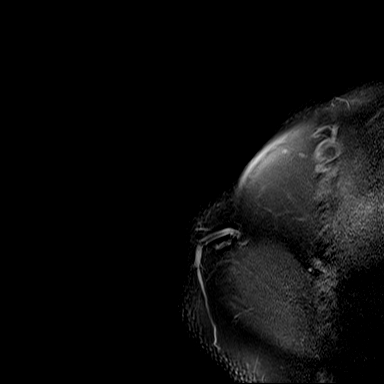

[Series 10: T2 fat-sat · oblique · right · 4.0mm · 0.50mm/px · 5 of 30 slices shown (1 of 2)]
[im 1/30]
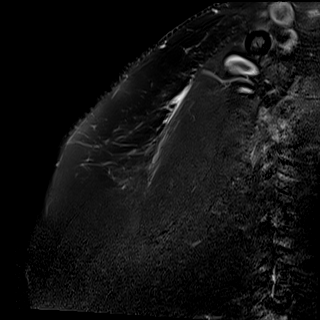
[im 8/30]
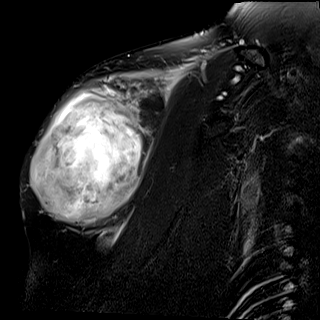
[im 15/30]
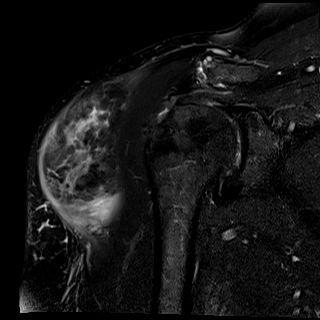
[im 22/30]
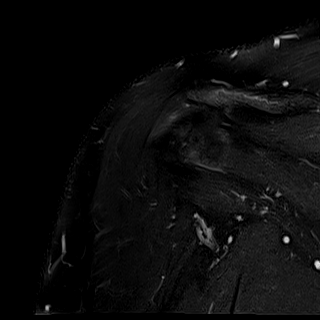
[im 30/30]
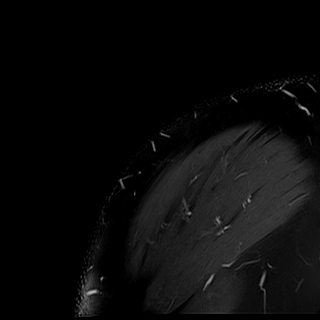

[Series 11: PD fat-sat · oblique · right · 4.0mm · 0.45mm/px · 5 of 30 slices shown (2 of 2)]
[im 1/30]
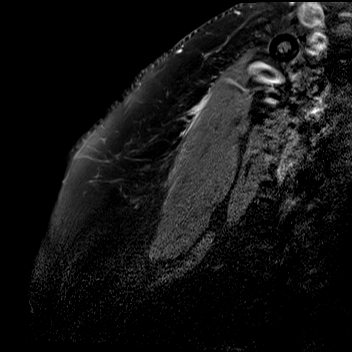
[im 8/30]
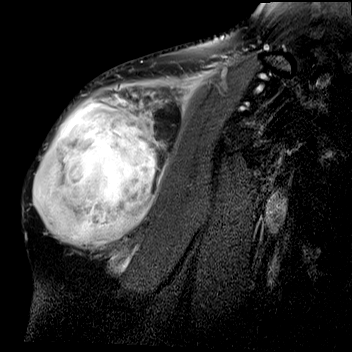
[im 15/30]
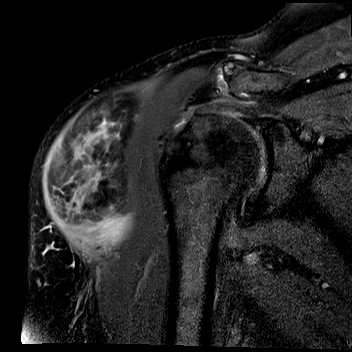
[im 22/30]
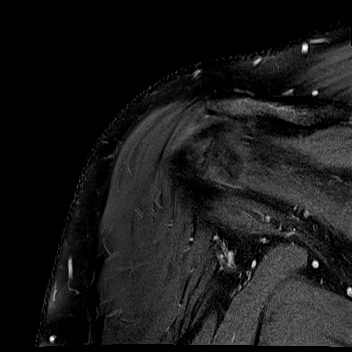
[im 30/30]
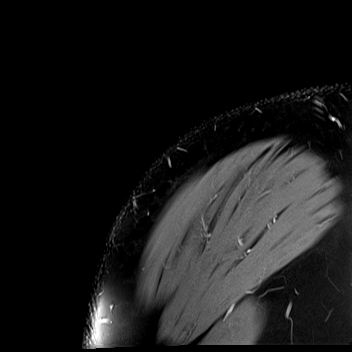

[Series 12: T2 fat-sat · oblique · right · 4.0mm · 0.56mm/px · 5 of 31 slices shown (2 of 2)]
[im 1/31]
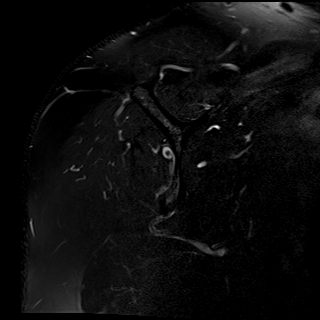
[im 8/31]
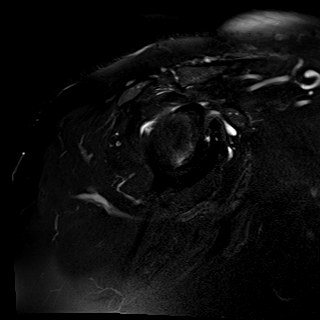
[im 16/31]
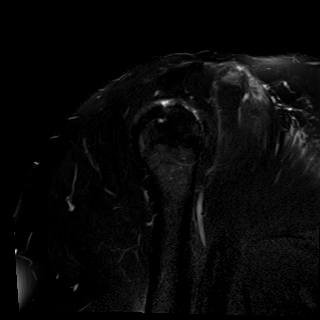
[im 23/31]
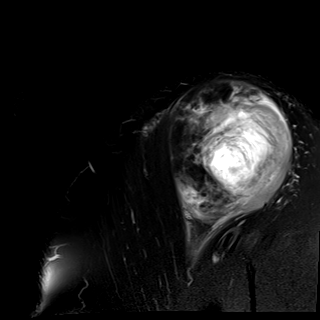
[im 31/31]
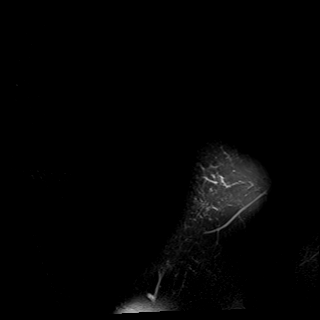

[Series 13: T1 · oblique · right · 4.0mm · 0.49mm/px · 5 of 31 slices shown]
[im 1/31]
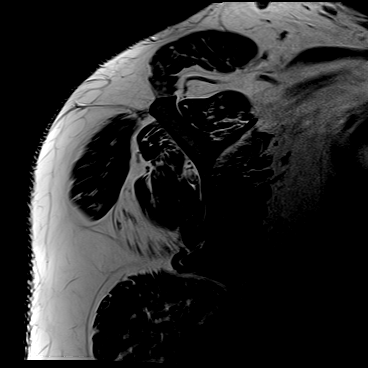
[im 8/31]
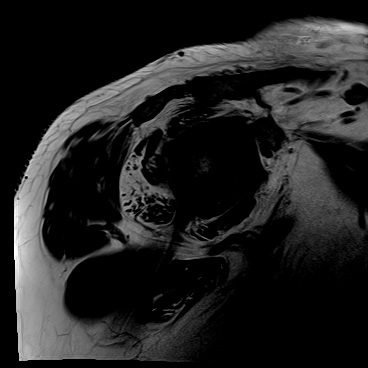
[im 16/31]
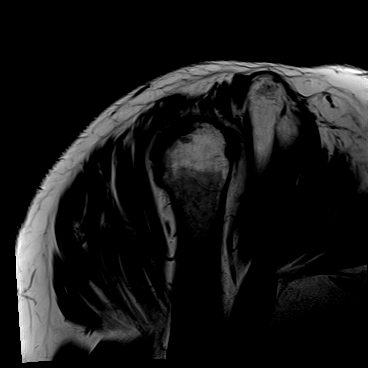
[im 23/31]
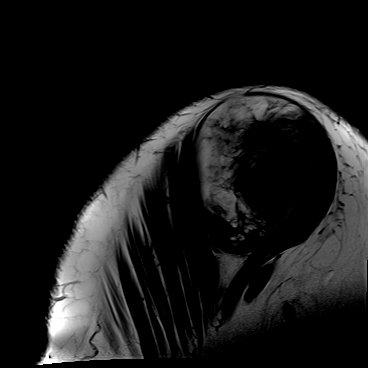
[im 31/31]
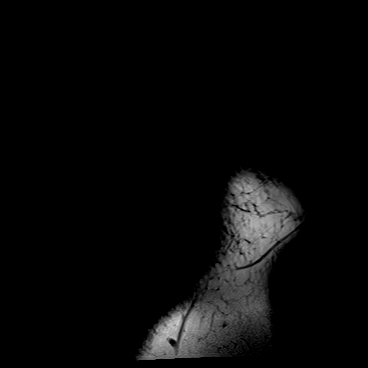

[Series 14: T1 fat-sat · axial · non-contrast · right · 4.0mm · 0.78mm/px · z∈[-106,+26]mm · 5 of 29 slices shown]
[im 1/29]
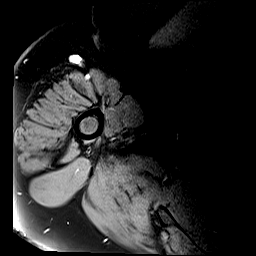
[im 8/29]
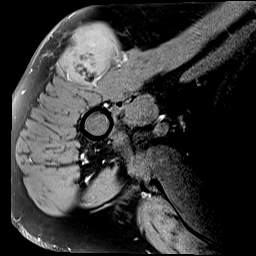
[im 15/29]
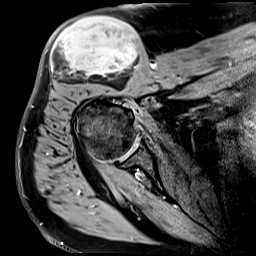
[im 22/29]
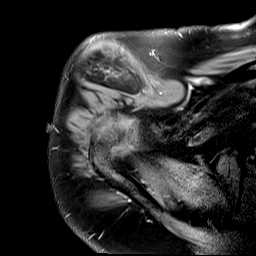
[im 29/29]
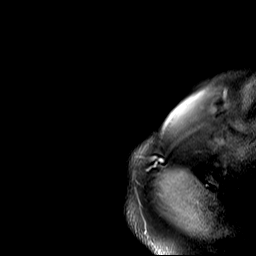

[Series 15: T1 fat-sat post-contrast · axial · right · 4.0mm · 0.78mm/px · z∈[-106,+26]mm · 5 of 29 slices shown (1 of 2)]
[im 1/29]
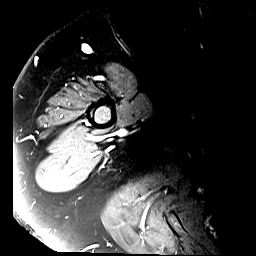
[im 8/29]
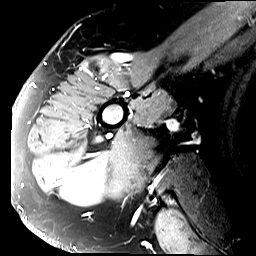
[im 15/29]
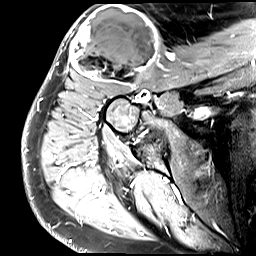
[im 22/29]
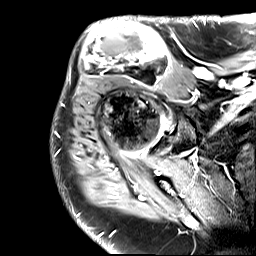
[im 29/29]
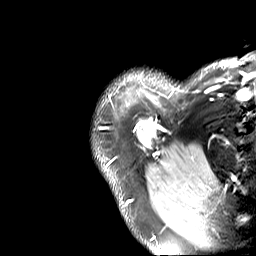

[Series 17: T1 fat-sat post-contrast · oblique · right · 4.0mm · 0.62mm/px · 5 of 30 slices shown (2 of 2)]
[im 1/30]
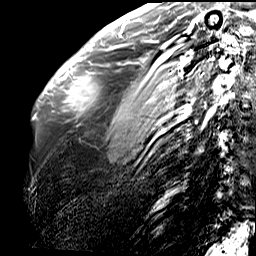
[im 8/30]
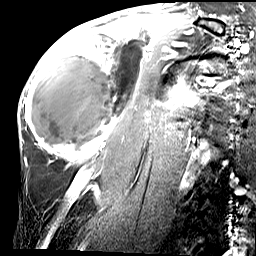
[im 15/30]
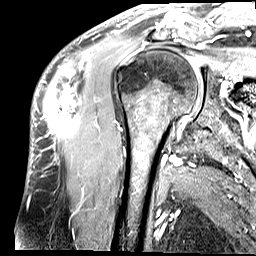
[im 22/30]
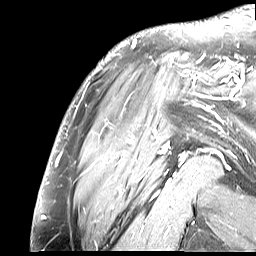
[im 30/30]
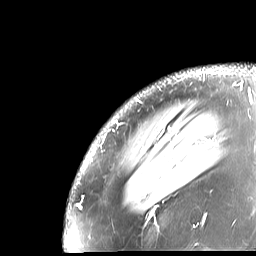

[40 of 40 positions shown; findings below may reference images not displayed]

FINDINGS: Rotator cuff: The patient has rotator cuff tendinopathy. There is a
partial width, full-thickness tear of the mid to posterior
supraspinatus measuring 1.1 cm from front to back. Retraction is to
the top of the humeral head, 2-3 cm. The rotator cuff otherwise
appears intact.

Muscles: There is fatty atrophy of the teres minor. Musculature is
otherwise unremarkable.

Biceps long head:  Intact.

Acromioclavicular Joint: Moderate to moderately severe
osteoarthritis. Type 2 acromion. Small volume of
subacromial/subdeltoid fluid noted.

Glenohumeral Joint: Mild degenerative change is seen with a small
osteophyte off the humeral head.

Labrum:  Intact.

Bones:  No fracture or worrisome lesion.

Other: Anterior to the deltoid muscle, there is a mass lesion
measuring 6.7 cm transverse by 5.8 cm AP by 10 cm craniocaudal. The
lesion has large areas of both T1 and T2 hyperintensity. The
superior aspect of the lesion and periphery demonstrate postcontrast
enhancement. A large central component of the lesion does not
enhance and may be necrotic. The lesion abuts the deltoid and
pectoralis muscles but does not invade them. It does not involve any
major neurovascular bundle.
IMPRESSION: Large lipomatous tumor in the region of concern has imaging features
worrisome for liposarcoma. Recommend consultation with Orthopedic
Oncology.

Rotator cuff tendinopathy with a 1.1 cm from front to back
full-thickness supraspinatus tear. Retraction is 2-3 cm. No atrophy.

Moderate to moderately severe acromioclavicular mild glenohumeral
osteoarthritis.

Small volume of subacromial/subdeltoid fluid compatible with
bursitis.

These results will be called to the ordering clinician or
representative by the Radiologist Assistant, and communication
documented in the PACS or [REDACTED].

## 2021-10-03 NOTE — Telephone Encounter (Signed)
Will close encounter
